# Patient Record
Sex: Male | Born: 1960 | Race: White | Hispanic: No | Marital: Single | Smoking: Never smoker
Health system: Southern US, Community
[De-identification: ages and names within clinical notes are randomized; demographics above are authoritative.]

## PROBLEM LIST (undated history)

## (undated) DIAGNOSIS — Z8481 Family history of carrier of genetic disease: Secondary | ICD-10-CM

## (undated) DIAGNOSIS — Z1501 Genetic susceptibility to malignant neoplasm of breast: Secondary | ICD-10-CM

## (undated) DIAGNOSIS — E785 Hyperlipidemia, unspecified: Secondary | ICD-10-CM

## (undated) DIAGNOSIS — I10 Essential (primary) hypertension: Secondary | ICD-10-CM

## (undated) DIAGNOSIS — Z8042 Family history of malignant neoplasm of prostate: Secondary | ICD-10-CM

## (undated) DIAGNOSIS — K760 Fatty (change of) liver, not elsewhere classified: Secondary | ICD-10-CM

## (undated) DIAGNOSIS — Z8041 Family history of malignant neoplasm of ovary: Secondary | ICD-10-CM

## (undated) DIAGNOSIS — Z1509 Genetic susceptibility to other malignant neoplasm: Principal | ICD-10-CM

## (undated) HISTORY — DX: Essential (primary) hypertension: I10

## (undated) HISTORY — DX: Family history of malignant neoplasm of prostate: Z80.42

## (undated) HISTORY — DX: Family history of carrier of genetic disease: Z84.81

## (undated) HISTORY — DX: Family history of malignant neoplasm of ovary: Z80.41

## (undated) HISTORY — DX: Hyperlipidemia, unspecified: E78.5

## (undated) HISTORY — DX: Fatty (change of) liver, not elsewhere classified: K76.0

## (undated) HISTORY — PX: NO PAST SURGERIES: SHX2092

## (undated) HISTORY — DX: Genetic susceptibility to other malignant neoplasm: Z15.09

## (undated) HISTORY — DX: Genetic susceptibility to malignant neoplasm of breast: Z15.09

## (undated) HISTORY — DX: Genetic susceptibility to malignant neoplasm of breast: Z15.01

---

## 1998-06-08 ENCOUNTER — Other Ambulatory Visit: Admission: RE | Admit: 1998-06-08 | Discharge: 1998-06-08 | Payer: Self-pay | Admitting: *Deleted

## 2006-09-13 ENCOUNTER — Ambulatory Visit: Payer: Self-pay | Admitting: Family Medicine

## 2006-10-03 ENCOUNTER — Ambulatory Visit: Payer: Self-pay | Admitting: Family Medicine

## 2006-11-08 ENCOUNTER — Ambulatory Visit: Payer: Self-pay | Admitting: Family Medicine

## 2006-11-08 LAB — CONVERTED CEMR LAB
BUN: 9 mg/dL (ref 6–23)
CO2: 27 meq/L (ref 19–32)
Chloride: 105 meq/L (ref 96–112)
Glomerular Filtration Rate, Af Am: 118 mL/min/{1.73_m2}
Glucose, Bld: 126 mg/dL — ABNORMAL HIGH (ref 70–99)

## 2006-11-21 ENCOUNTER — Ambulatory Visit: Payer: Self-pay | Admitting: Gastroenterology

## 2006-12-10 DIAGNOSIS — K760 Fatty (change of) liver, not elsewhere classified: Secondary | ICD-10-CM

## 2006-12-10 HISTORY — DX: Fatty (change of) liver, not elsewhere classified: K76.0

## 2006-12-16 ENCOUNTER — Ambulatory Visit: Payer: Self-pay | Admitting: Gastroenterology

## 2006-12-16 LAB — CONVERTED CEMR LAB
Ceruloplasmin: 26 mg/dL (ref 21–63)
HCV Ab: NEGATIVE

## 2007-01-04 DIAGNOSIS — I10 Essential (primary) hypertension: Secondary | ICD-10-CM | POA: Insufficient documentation

## 2007-01-04 DIAGNOSIS — E785 Hyperlipidemia, unspecified: Secondary | ICD-10-CM

## 2007-01-17 ENCOUNTER — Ambulatory Visit: Payer: Self-pay | Admitting: Gastroenterology

## 2007-02-10 ENCOUNTER — Ambulatory Visit: Payer: Self-pay | Admitting: Family Medicine

## 2007-02-10 LAB — CONVERTED CEMR LAB
BUN: 8 mg/dL (ref 6–23)
GFR calc Af Amer: 104 mL/min
Glucose, Bld: 135 mg/dL — ABNORMAL HIGH (ref 70–99)
HDL: 36.1 mg/dL — ABNORMAL LOW (ref >39.0)
Sodium: 141 meq/L (ref 135–145)
Total CHOL/HDL Ratio: 4.9
Triglycerides: 205 mg/dL (ref 0–149)
VLDL: 41 mg/dL — ABNORMAL HIGH (ref 0–40)

## 2007-03-07 ENCOUNTER — Ambulatory Visit: Payer: Self-pay | Admitting: Family Medicine

## 2007-03-07 LAB — CONVERTED CEMR LAB: Hgb A1c MFr Bld: 6.6 % — ABNORMAL HIGH (ref 4.6–6.0)

## 2007-03-19 ENCOUNTER — Ambulatory Visit: Payer: Self-pay | Admitting: Family Medicine

## 2007-04-11 ENCOUNTER — Encounter: Admission: RE | Admit: 2007-04-11 | Discharge: 2007-07-10 | Payer: Self-pay | Admitting: Family Medicine

## 2007-06-04 ENCOUNTER — Ambulatory Visit: Payer: Self-pay | Admitting: Family Medicine

## 2007-06-06 ENCOUNTER — Encounter (INDEPENDENT_AMBULATORY_CARE_PROVIDER_SITE_OTHER): Payer: Self-pay | Admitting: *Deleted

## 2007-06-06 LAB — CONVERTED CEMR LAB
Direct LDL: 142.4 mg/dL
Hgb A1c MFr Bld: 6.1 % — ABNORMAL HIGH (ref 4.6–6.0)
Total CHOL/HDL Ratio: 6.2

## 2007-06-12 ENCOUNTER — Ambulatory Visit: Payer: Self-pay | Admitting: Family Medicine

## 2007-06-12 DIAGNOSIS — E119 Type 2 diabetes mellitus without complications: Secondary | ICD-10-CM | POA: Insufficient documentation

## 2007-06-19 ENCOUNTER — Ambulatory Visit: Payer: Self-pay | Admitting: Cardiovascular Disease

## 2007-07-28 ENCOUNTER — Encounter (INDEPENDENT_AMBULATORY_CARE_PROVIDER_SITE_OTHER): Payer: Self-pay | Admitting: *Deleted

## 2007-07-28 ENCOUNTER — Ambulatory Visit: Payer: Self-pay | Admitting: Family Medicine

## 2007-07-28 LAB — CONVERTED CEMR LAB
ALT: 35 units/L (ref 0–53)
BUN: 11 mg/dL (ref 6–23)
CO2: 29 meq/L (ref 19–32)
Cholesterol: 125 mg/dL (ref 0–200)
Creatinine, Ser: 0.7 mg/dL (ref 0.4–1.5)
HDL: 35.5 mg/dL — ABNORMAL LOW (ref >39.0)
Sodium: 146 meq/L — ABNORMAL HIGH (ref 135–145)
Total CHOL/HDL Ratio: 3.5
Triglycerides: 108 mg/dL (ref 0–149)
VLDL: 22 mg/dL (ref 0–40)

## 2007-07-31 ENCOUNTER — Ambulatory Visit: Payer: Self-pay | Admitting: Cardiology

## 2007-09-24 ENCOUNTER — Ambulatory Visit: Payer: Self-pay | Admitting: Family Medicine

## 2007-09-24 LAB — CONVERTED CEMR LAB
LDL Cholesterol: 51 mg/dL (ref 0–99)
Total CHOL/HDL Ratio: 3.5
Triglycerides: 166 mg/dL — ABNORMAL HIGH (ref 0–149)
VLDL: 33 mg/dL (ref 0–40)

## 2007-10-01 ENCOUNTER — Ambulatory Visit: Payer: Self-pay | Admitting: Family Medicine

## 2007-10-16 ENCOUNTER — Ambulatory Visit: Payer: Self-pay | Admitting: Family Medicine

## 2007-10-17 LAB — CONVERTED CEMR LAB
ALT: 36 units/L (ref 0–53)
Alkaline Phosphatase: 71 units/L (ref 39–117)
Bilirubin, Direct: 0.1 mg/dL (ref 0.0–0.3)
Cholesterol: 123 mg/dL (ref 0–200)
HDL: 31.4 mg/dL — ABNORMAL LOW (ref >39.0)
LDL Cholesterol: 60 mg/dL (ref 0–99)
VLDL: 32 mg/dL (ref 0–40)

## 2007-10-23 ENCOUNTER — Ambulatory Visit: Payer: Self-pay | Admitting: Cardiology

## 2008-01-07 ENCOUNTER — Ambulatory Visit: Payer: Self-pay | Admitting: Family Medicine

## 2008-01-09 ENCOUNTER — Encounter (INDEPENDENT_AMBULATORY_CARE_PROVIDER_SITE_OTHER): Payer: Self-pay | Admitting: *Deleted

## 2008-01-09 LAB — CONVERTED CEMR LAB
CO2: 25 meq/L (ref 19–32)
Creatinine, Ser: 0.9 mg/dL (ref 0.4–1.5)
GFR calc non Af Amer: 97 mL/min
Hgb A1c MFr Bld: 6.1 % — ABNORMAL HIGH (ref 4.6–6.0)
Microalb Creat Ratio: 11.5 mg/g (ref 0.0–30.0)
Microalb, Ur: 1.7 mg/dL (ref 0.0–1.9)
Sodium: 139 meq/L (ref 135–145)

## 2008-01-22 ENCOUNTER — Ambulatory Visit: Payer: Self-pay | Admitting: Family Medicine

## 2008-02-11 ENCOUNTER — Telehealth (INDEPENDENT_AMBULATORY_CARE_PROVIDER_SITE_OTHER): Payer: Self-pay | Admitting: *Deleted

## 2008-03-18 ENCOUNTER — Ambulatory Visit: Payer: Self-pay | Admitting: Internal Medicine

## 2008-03-22 ENCOUNTER — Encounter (INDEPENDENT_AMBULATORY_CARE_PROVIDER_SITE_OTHER): Payer: Self-pay | Admitting: *Deleted

## 2008-03-22 LAB — CONVERTED CEMR LAB
AST: 25 units/L (ref 0–37)
Albumin: 3.9 g/dL (ref 3.5–5.2)
Bilirubin, Direct: 0.1 mg/dL (ref 0.0–0.3)
Cholesterol: 112 mg/dL (ref 0–200)
LDL Cholesterol: 54 mg/dL (ref 0–99)
Total Bilirubin: 0.8 mg/dL (ref 0.3–1.2)
Total CHOL/HDL Ratio: 3.9

## 2008-03-25 ENCOUNTER — Ambulatory Visit: Payer: Self-pay | Admitting: Cardiology

## 2008-04-07 ENCOUNTER — Ambulatory Visit: Payer: Self-pay | Admitting: Internal Medicine

## 2008-04-09 LAB — CONVERTED CEMR LAB: Hgb A1c MFr Bld: 6.2 % — ABNORMAL HIGH (ref 4.6–6.0)

## 2008-06-10 ENCOUNTER — Ambulatory Visit: Payer: Self-pay | Admitting: Internal Medicine

## 2008-06-14 ENCOUNTER — Encounter (INDEPENDENT_AMBULATORY_CARE_PROVIDER_SITE_OTHER): Payer: Self-pay | Admitting: *Deleted

## 2008-06-14 LAB — CONVERTED CEMR LAB
Cholesterol: 124 mg/dL (ref 0–200)
LDL Cholesterol: 57 mg/dL (ref 0–99)
Triglycerides: 176 mg/dL — ABNORMAL HIGH (ref 0–149)
VLDL: 35 mg/dL (ref 0–40)

## 2008-06-17 ENCOUNTER — Ambulatory Visit: Payer: Self-pay | Admitting: Cardiology

## 2008-06-17 ENCOUNTER — Telehealth: Payer: Self-pay | Admitting: Internal Medicine

## 2008-08-04 ENCOUNTER — Ambulatory Visit: Payer: Self-pay | Admitting: Internal Medicine

## 2008-08-09 LAB — CONVERTED CEMR LAB
Basophils Absolute: 0.1 10*3/uL (ref 0.0–0.1)
Basophils Relative: 0.8 % (ref 0.0–3.0)
CO2: 26 meq/L (ref 19–32)
Calcium: 9 mg/dL (ref 8.4–10.5)
Creatinine, Ser: 0.9 mg/dL (ref 0.4–1.5)
Eosinophils Absolute: 0.3 10*3/uL (ref 0.0–0.7)
GFR calc Af Amer: 117 mL/min
GFR calc non Af Amer: 97 mL/min
Hemoglobin: 15.9 g/dL (ref 13.0–17.0)
Hgb A1c MFr Bld: 6.4 % — ABNORMAL HIGH (ref 4.6–6.0)
Lymphocytes Relative: 25 % (ref 12.0–46.0)
MCHC: 35.5 g/dL (ref 30.0–36.0)
MCV: 93.3 fL (ref 78.0–100.0)
Neutro Abs: 5.6 10*3/uL (ref 1.4–7.7)
RDW: 12.2 % (ref 11.5–14.6)
Sodium: 141 meq/L (ref 135–145)

## 2008-09-14 ENCOUNTER — Telehealth (INDEPENDENT_AMBULATORY_CARE_PROVIDER_SITE_OTHER): Payer: Self-pay | Admitting: *Deleted

## 2008-09-28 ENCOUNTER — Telehealth (INDEPENDENT_AMBULATORY_CARE_PROVIDER_SITE_OTHER): Payer: Self-pay | Admitting: *Deleted

## 2008-11-11 ENCOUNTER — Ambulatory Visit: Payer: Self-pay | Admitting: Internal Medicine

## 2008-11-18 ENCOUNTER — Ambulatory Visit: Payer: Self-pay | Admitting: Cardiology

## 2008-11-23 ENCOUNTER — Telehealth (INDEPENDENT_AMBULATORY_CARE_PROVIDER_SITE_OTHER): Payer: Self-pay | Admitting: *Deleted

## 2008-11-23 LAB — CONVERTED CEMR LAB: Cholesterol: 127 mg/dL (ref 0–200)

## 2009-01-03 ENCOUNTER — Ambulatory Visit: Payer: Self-pay | Admitting: Internal Medicine

## 2009-01-06 LAB — CONVERTED CEMR LAB
AST: 31 units/L (ref 0–37)
Albumin: 4.2 g/dL (ref 3.5–5.2)
Alkaline Phosphatase: 52 units/L (ref 39–117)
BUN: 11 mg/dL (ref 6–23)
Bilirubin, Direct: 0.1 mg/dL (ref 0.0–0.3)
CO2: 26 meq/L (ref 19–32)
Chloride: 109 meq/L (ref 96–112)
Glucose, Bld: 137 mg/dL — ABNORMAL HIGH (ref 70–99)
Microalb Creat Ratio: 4.1 mg/g (ref 0.0–30.0)
Potassium: 4.1 meq/L (ref 3.5–5.1)
Sodium: 143 meq/L (ref 135–145)
Total Protein: 6.8 g/dL (ref 6.0–8.3)

## 2009-03-03 ENCOUNTER — Ambulatory Visit: Payer: Self-pay | Admitting: Internal Medicine

## 2009-03-08 LAB — CONVERTED CEMR LAB
Cholesterol: 126 mg/dL (ref 0–200)
HDL: 30.5 mg/dL — ABNORMAL LOW (ref >39.00)
LDL Cholesterol: 61 mg/dL (ref 0–99)
Total CHOL/HDL Ratio: 4
Triglycerides: 171 mg/dL — ABNORMAL HIGH (ref 0.0–149.0)

## 2009-03-10 ENCOUNTER — Ambulatory Visit: Payer: Self-pay | Admitting: Internal Medicine

## 2009-03-10 ENCOUNTER — Encounter: Payer: Self-pay | Admitting: Cardiology

## 2009-03-10 LAB — CONVERTED CEMR LAB: HDL goal, serum: 40 mg/dL

## 2009-03-16 ENCOUNTER — Encounter: Payer: Self-pay | Admitting: Internal Medicine

## 2009-05-03 ENCOUNTER — Ambulatory Visit: Payer: Self-pay | Admitting: Internal Medicine

## 2009-05-12 LAB — CONVERTED CEMR LAB: Hgb A1c MFr Bld: 6.3 % (ref 4.6–6.5)

## 2009-08-22 ENCOUNTER — Ambulatory Visit: Payer: Self-pay | Admitting: Internal Medicine

## 2009-08-22 LAB — CONVERTED CEMR LAB
ALT: 37 units/L (ref 0–53)
AST: 31 units/L (ref 0–37)
Albumin: 3.8 g/dL (ref 3.5–5.2)
HDL: 33.4 mg/dL — ABNORMAL LOW (ref >39.00)
Total Bilirubin: 0.7 mg/dL (ref 0.3–1.2)
Total CHOL/HDL Ratio: 4
Triglycerides: 122 mg/dL (ref 0.0–149.0)
VLDL: 24.4 mg/dL (ref 0.0–40.0)

## 2009-08-25 ENCOUNTER — Ambulatory Visit: Payer: Self-pay | Admitting: Cardiology

## 2009-09-02 ENCOUNTER — Telehealth: Payer: Self-pay | Admitting: Cardiology

## 2009-10-28 ENCOUNTER — Encounter: Payer: Self-pay | Admitting: Internal Medicine

## 2010-01-13 ENCOUNTER — Ambulatory Visit: Payer: Self-pay | Admitting: Internal Medicine

## 2010-01-13 DIAGNOSIS — L989 Disorder of the skin and subcutaneous tissue, unspecified: Secondary | ICD-10-CM | POA: Insufficient documentation

## 2010-01-13 LAB — CONVERTED CEMR LAB
Creatinine,U: 133.5 mg/dL
Microalb, Ur: 0.5 mg/dL (ref 0.0–1.9)

## 2010-01-16 LAB — CONVERTED CEMR LAB
ALT: 42 units/L (ref 0–53)
Basophils Relative: 0.9 % (ref 0.0–3.0)
Chloride: 107 meq/L (ref 96–112)
Eosinophils Relative: 3.2 % (ref 0.0–5.0)
GFR calc non Af Amer: 109.89 mL/min (ref >60)
HCT: 48.7 % (ref 39.0–52.0)
Hemoglobin: 16 g/dL (ref 13.0–17.0)
Hgb A1c MFr Bld: 6.5 % (ref 4.6–6.5)
Lymphs Abs: 2.1 10*3/uL (ref 0.7–4.0)
MCV: 97.3 fL (ref 78.0–100.0)
Monocytes Relative: 4.8 % (ref 3.0–12.0)
Neutro Abs: 5.5 10*3/uL (ref 1.4–7.7)
Potassium: 3.9 meq/L (ref 3.5–5.1)
RBC: 5 M/uL (ref 4.22–5.81)
Sodium: 142 meq/L (ref 135–145)
Total Protein: 7.7 g/dL (ref 6.0–8.3)
WBC: 8.4 10*3/uL (ref 4.5–10.5)

## 2010-02-20 ENCOUNTER — Ambulatory Visit: Payer: Self-pay | Admitting: Internal Medicine

## 2010-02-20 LAB — CONVERTED CEMR LAB
ALT: 47 units/L (ref 0–53)
AST: 41 units/L — ABNORMAL HIGH (ref 0–37)
Cholesterol: 123 mg/dL (ref 0–200)
HDL: 39.3 mg/dL (ref >39.00)
Total Bilirubin: 0.6 mg/dL (ref 0.3–1.2)
Total Protein: 6.6 g/dL (ref 6.0–8.3)
VLDL: 37.4 mg/dL (ref 0.0–40.0)

## 2010-02-23 ENCOUNTER — Ambulatory Visit: Payer: Self-pay | Admitting: Cardiology

## 2010-05-15 ENCOUNTER — Ambulatory Visit: Payer: Self-pay | Admitting: Internal Medicine

## 2010-10-02 ENCOUNTER — Ambulatory Visit: Payer: Self-pay | Admitting: Internal Medicine

## 2010-10-06 ENCOUNTER — Ambulatory Visit: Payer: Self-pay | Admitting: Internal Medicine

## 2010-10-12 ENCOUNTER — Ambulatory Visit: Payer: Self-pay | Admitting: Cardiology

## 2010-10-12 LAB — CONVERTED CEMR LAB
AST: 29 units/L (ref 0–37)
Alkaline Phosphatase: 70 units/L (ref 39–117)
Bilirubin, Direct: 0.1 mg/dL (ref 0.0–0.3)
Cholesterol: 116 mg/dL (ref 0–200)
LDL Cholesterol: 56 mg/dL (ref 0–99)
Total CHOL/HDL Ratio: 3
Total Protein: 6.6 g/dL (ref 6.0–8.3)

## 2010-11-23 ENCOUNTER — Ambulatory Visit: Payer: Self-pay | Admitting: Internal Medicine

## 2010-11-28 LAB — CONVERTED CEMR LAB
ALT: 25 units/L (ref 0–53)
AST: 22 units/L (ref 0–37)
BUN: 11 mg/dL (ref 6–23)
Chloride: 106 meq/L (ref 96–112)
Hgb A1c MFr Bld: 6.4 % (ref 4.6–6.5)
Potassium: 4.8 meq/L (ref 3.5–5.1)
Sodium: 143 meq/L (ref 135–145)

## 2010-12-12 ENCOUNTER — Telehealth (INDEPENDENT_AMBULATORY_CARE_PROVIDER_SITE_OTHER): Payer: Self-pay | Admitting: *Deleted

## 2011-01-11 NOTE — Assessment & Plan Note (Signed)
Summary: 4 month roa//lh   Vital Signs:  Patient profile:   50 year old male Height:      66.6 inches Weight:      212 pounds Temp:     98.9 degrees F oral Pulse rate:   72 / minute BP sitting:   140 / 100  (left arm)  Vitals Entered By: Jeremy Johann CMA (May 15, 2010 9:18 AM) CC: 4 MONTH F/U Comments --BP CHECK --FASTING REVIEWED MED LIST, PATIENT AGREED DOSE AND INSTRUCTION CORRECT     History of Present Illness: Hyperlipidemia-- note from the lipid clinic reviewed  Hypertension-- ambulatory BPs in the 120/75 Diabetes -- ambulatory CBGs from 90 to 100  Allergies (verified): No Known Drug Allergies  Past History:  Past Medical History: Hyperlipidemia Hypertension Diabetes mellitus, type II, Dx 2008 aprox  Past Surgical History: Reviewed history from 01/07/2008 and no changes required. 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 CV:  Denies chest pain or discomfort and swelling of feet. Resp:  Denies cough and shortness of breath. Endo:  good medication compliance w/ chol meds .  Physical Exam  General:  alert and well-developed.   Lungs:  normal respiratory effort, no intercostal retractions, no accessory muscle use, and normal breath sounds.   Heart:  normal rate, regular rhythm, and no murmur.   Abdomen:  soft and non-tender.   Extremities:  no pretibial edema bilaterally    Impression & Recommendations:  Problem # 1:  DIABETES MELLITUS, TYPE II (ICD-250.00) well controlled, on diet only. His updated medication list for this problem includes:    [employer]ons Ec Aspirin 325 Mg Tbec (Aspirin) .Marland Kitchen... Take one tablet.  Labs Reviewed: Creat: 0.8 (01/13/2010)     Last Eye Exam: normal (06/09/2008) Reviewed HgBA1c results: 6.5 (01/13/2010)  6.3  (05/03/2009)  Problem # 2:  HYPERTENSION (ICD-401.9) ambulatory BPs are normal, he takes his BP with a wrist cuff. 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 1 by mouth qd  BP today: 140/100 Prior BP: 132/90 (02/23/2010)  Prior 10 Yr Risk Heart Disease: 14 % (08/25/2009)  Labs Reviewed: K+: 3.9 (01/13/2010) Creat: : 0.8 (01/13/2010)   Chol: 123 (02/20/2010)   HDL: 39.30 (02/20/2010)   LDL: 46 (02/20/2010)   TG: 187.0 (02/20/2010)  Problem # 3:  HYPERLIPIDEMIA (ICD-272.4) per the lipid clinic His updated medication list for this problem includes:    Lipitor 10 Mg Tabs (Atorvastatin calcium) .Marland Kitchen... 1 a day    Niaspan 1000 Mg Tbcr (Niacin (antihyperlipidemic)) .Marland Kitchen... Take 2 tablet daily  Labs Reviewed: SGOT: 41 (02/20/2010)   SGPT: 47 (02/20/2010)  Lipid Goals: Chol Goal: 200 (03/10/2009)   HDL Goal: 40 (03/10/2009)   LDL Goal: 100 (03/10/2009)   TG Goal: 150 (03/10/2009)  Prior 10 Yr Risk Heart Disease: 14 % (08/25/2009)   HDL:39.30 (02/20/2010), 33.40 (08/22/2009)  LDL:46 (02/20/2010), 60 (08/22/2009)  Chol:123 (02/20/2010), 118 (08/22/2009)  Trig:187.0 (02/20/2010), 122.0 (08/22/2009)  Complete Medication List: 1)  Lipitor 10 Mg Tabs (Atorvastatin calcium) .Marland Kitchen.. 1 a day 2)  Omega-3 1000 Mg Caps (Omega-3 fatty acids) .... Take 2  caps bid 3)  Niaspan 1000 Mg Tbcr (Niacin (antihyperlipidemic)) .... Take 2 tablet daily 4)  Atenolol 100 Mg Tabs (Atenolol) .Marland Kitchen.. 1 by mouth qd 5)  [employer] Ec Aspirin 325 Mg Tbec (Aspirin) .... Take one tablet. 6)  Centrum Chew (Multiple vitamins-minerals) .... Take one daily. 7)  One Touch Lancets Misc (Lancets) .... Use as directed twice daily 8)  One Touch Ultra2 Meter  .... Use as directed 9)  One Touch Ultra2 Testing Strips  .... Use  one strip in glucometer twice daily as directed.  Patient Instructions: 1)  Please schedule a follow-up appointment in 4 to 5 months .  Prescriptions: LIPITOR 10 MG TABS (ATORVASTATIN CALCIUM) 1 a day  #30 x 12   Entered and Authorized by:   Tiffney Haughton E. Darean Rote MD   Signed by:   Nolon Rod. Garald Rhew MD on 05/15/2010   Method used:   Print then Give to Patient   RxID:   [id number] NIASPAN 1000 MG TBCR (NIACIN (ANTIHYPERLIPIDEMIC)) Take 2 tablet daily  #180 x 3   Entered and Authorized by:   Elita Quick E. Odyn Turko MD   Signed by:   Nolon Rod. Cheril Slattery MD on 05/15/2010   Method used:   Print then Give to Patient   RxID:   [id number]

## 2011-01-11 NOTE — Assessment & Plan Note (Signed)
Summary: rov/sp   Referring Provider:  Leanne Chang  CC:  dyslipidemia follow-up.  History of Present Illness:  Lipid Clinic Visit      The patient comes in today for dyslipidemia follow-up.  The patient has no complaints of chest pain, shortness of breath, muscle aches, and muscle cramps.  Review of exercise habits reveals that the patient is walking 3 days a week.  Risk factor modification shows that the patient is doing  some exercise and is compliant with medication is good.  He is trying to limit fats and eating more vegetables.   Lipid Management Provider  Weston Brass, PharmD  Preventive Screening-Counseling & Management  Alcohol-Tobacco     Alcohol drinks/day: 0     Smoking Status: never  Caffeine-Diet-Exercise     Caffeine use/day: diet soda each meal  Current Medications (verified): 1)  Lipitor 10 Mg Tabs (Atorvastatin Calcium) .Marland Kitchen.. 1 A Day 2)  Omega-3 1000 Mg Caps (Omega-3 Fatty Acids) .... Take 2 Caps Bid 3)  Niaspan 1000 Mg Tbcr (Niacin (Antihyperlipidemic)) .... Take 2 Tablet Daily 4)  Atenolol 100 Mg Tabs (Atenolol) .Marland Kitchen.. 1 By Mouth Qd 5)  [employer] Ec Aspirin 325 Mg  Tbec (Aspirin) .... Take One Tablet. 6)  Centrum   Chew (Multiple Vitamins-Minerals) .... Take One Daily. 7)  One Touch Lancets   Misc (Lancets) .... Use As Directed Twice Daily 8)  One Touch Ultra2 Meter .... Use As Directed 9)  One Touch Ultra2 Testing Strips .... Use One Strip in Glucometer Twice Daily As Directed.  Allergies (verified): No Known Drug Allergies  Social History: Caffeine use/day:  diet soda each meal   Vital Signs:  Patient profile:   50 year old male Height:      66.5 inches Weight:      216 pounds Pulse rate:   80 / minute Pulse rhythm:   regular BP sitting:   132 / 90  (right arm) Cuff size:   large  Impression & Recommendations:  Problem # 1:  HYPERLIPIDEMIA (ICD-272.4)  His updated medication list for this problem includes:    Lipitor 10 Mg Tabs  (Atorvastatin calcium) .Marland Kitchen... 1 a day    Niaspan 1000 Mg Tbcr (Niacin (antihyperlipidemic)) .Marland Kitchen... Take 2 tablet daily    Fish Oil 1000mg  2 caps two times a day  Mr Leser returns to lipid clinic with no complaints.  No CP, SOB, muscle pains.  He states compliance with meds.  He has DM which is diet controlled.  Recent HgA1c = 6.5.  His BP is  increased today as well as at his last MD visit.  he is to  f/u with primary MD in June for recheck.  He is walking  3 x week at a time which  takes .  I have encouraged him to increase pace to take 76min/mi by end of May.  He is diet non-compliant.Marland KitchenMarland KitchenHe eats cheerios for breakfast, 1xweek he eats a grilled chicken salad for lunch, the other days he eats fried chicken sandwich and fries from [employer] and some type of beans for dinner.  i have asked him to decrease fried chicken and increase grilled chicken sandwich and salad,  as well as count out 5-6 large [employer] french fries and throw the rest away.  I alos encouraged him to increase fruits and vegies in diet and decrease diet sodas.  I think he is hesitant to change diet exercise since his lipid panel is at goal.  TC 123 at goal <  200  TG 187 > goal < 150  HDL 39 close to goal > 40 much improved from last visit  LDL 46 at goal < 70 labs of f/u 72mo

## 2011-01-11 NOTE — Progress Notes (Signed)
  Phone Note Call from Patient   Summary of Call: Pt called and wants to switch back to [facility]- will send Lipitor and Metformin to [facility]. Army Fossa CMA  December 12, 2010 9:37 AM     New/Updated Medications: LIPITOR 10 MG TABS (ATORVASTATIN CALCIUM) 1 by mouth at bedtime. METFORMIN HCL 500 MG TABS (METFORMIN HCL) 1 po  two times a day. Prescriptions: LIPITOR 10 MG TABS (ATORVASTATIN CALCIUM) 1 by mouth at bedtime.  #90 x 1   Entered by:   Army Fossa CMA   Authorized by:   Nolon Rod. Paz MD   Signed by:   Army Fossa CMA on 12/12/2010   Method used:   Faxed to ...       [facility] (mail-order)             , [place]         Ph: [phone]       Fax: [phone]   RxID:   [id number] METFORMIN HCL 500 MG TABS (METFORMIN HCL) 1 po  two times a day.  #180 x 1   Entered by:   Army Fossa CMA   Authorized by:   Nolon Rod. Paz MD   Signed by:   Army Fossa CMA on 12/12/2010   Method used:   Faxed to ...       [facility] (mail-order)             , [place]         Ph: [phone]       Fax: [phone]   RxID:   [id number]

## 2011-01-11 NOTE — Assessment & Plan Note (Signed)
Summary: lipid - [facility]      Allergies Added: NKDA      [name] returns to lipid clinic with no complaints.  No CP, SOB, or muscle pains.  He states compliance with current cholesterol medications which include Lipitor 10mg  and fish oil 4 gm.   His DM remains controlled with most recent A1c of 7%.  He was recently started on metformin.      Pt is trying to make changes in his diet but still has room for improvement. He is eating Cheerios for breakfast.  Lunch is often still fast food but he has substituted grilled chicken instead of fries and continues to cut down on french fries.  Dinner is often a ham sandwich.  He tries to make a cooked meal about 2 times a week but these include starchy vegetables such as corn and potatoes.  He does drink approx. 3 diet Pepsi a day.       He admits to not walking in the recent past.  He started walking in the mall today.  His plan is to walk 2 miles within 30 minutes 5 days a week.   Lipid Management [name]  [name], PharmD  Current Medications (verified): 1)  Lipitor 10 Mg Tabs (Atorvastatin Calcium) .Marland Kitchen.. 1 A Day 2)  Omega-3 1000 Mg Caps (Omega-3 Fatty Acids) .... Take 2 Caps Bid 3)  Niaspan 1000 Mg Tbcr (Niacin (Antihyperlipidemic)) .... Take 2 Tablet Daily 4)  Atenolol 100 Mg Tabs (Atenolol) .Marland Kitchen.. 1 By Mouth Qd 5)  [name] 325 Mg  Tbec (Benitez) .... Take One Tablet. 6)  Centrum   Chew (Multiple Vitamins-Minerals) .... Take One Daily. 7)  One Touch Lancets   Misc (Lancets) .... Use As Directed Twice Daily 8)  One Touch Ultra2 Meter .... Use As Directed 9)  One Touch Ultra2 Testing Strips .... Use One Strip in Glucometer Twice Daily As Directed. 10)  Metformin Hcl 500 Mg Tabs (Metformin Hcl) .... 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  His TG have improved from 187 to 130.  LDL increased slightly from 46 to 56 but still below goal of <70.  LFTs are WNL.  Will continue current medications.  Encouraged patient to continue trying to decrease fried, greasy fast foods.  Gave information on nutritional value of fast food meals so he can make better choices.  Asked him to decrease ham sandwiches and increase the home-cooked meals for dinner and substitute water for at least 1 soft drink/day.  Encouraged him to meet his goal of walking at least 5 days/week. Given pt has been well controlled and tolerated medications, will allow pt to f/u lipids with Dr. Drue Novel from now on.  If any problems arise, pt can f/u in lipid clinic again.    His updated medication  list for this problem includes:    Lipitor 10 Mg Tabs (Atorvastatin calcium) .Marland Kitchen... 1 a day    Niaspan 1000 Mg Tbcr (Niacin (antihyperlipidemic)) .Marland Kitchen... Take 2 tablet daily  Patient Instructions: 1)  Your cholesterol looks great 2)  Continue current medications 3)  Continue to cut down on french fries and fried foods.  Try to increase vegetables as much as possible 4)  Continue to exercise at the mall.  Try to increase your pace so your heartrate is slightly elevated 5)  Follow-up with Dr. Drue Novel to recheck cholesterol in 1 year.

## 2011-01-11 NOTE — Assessment & Plan Note (Signed)
Summary: 4-5 month roa//lch   Vital Signs:  Patient profile:   50 year old male Weight:      212.50 pounds Pulse rate:   76 / minute Pulse rhythm:   regular BP sitting:   130 / 88  (left arm) Cuff size:   large  Vitals Entered By: Army Fossa CMA (October 06, 2010 8:57 AM) CC: 5 month f/u visit- fasting  Comments [facility] [address]    History of Present Illness: ROV feeling well Recent labs from the lipid clinic reviewed with the patient. Cholesterol well controlled  Current Medications (verified): 1)  Lipitor 10 Mg Tabs (Atorvastatin Calcium) .Marland Kitchen.. 1 A Day 2)  Omega-3 1000 Mg Caps (Omega-3 Fatty Acids) .... Take 2 Caps Bid 3)  Niaspan 1000 Mg Tbcr (Niacin (Antihyperlipidemic)) .... Take 2 Tablet Daily 4)  Atenolol 100 Mg Tabs (Atenolol) .Marland Kitchen.. 1 By Mouth Qd 5)  [employer] Ec Aspirin 325 Mg  Tbec (Aspirin) .... Take One Tablet. 6)  Centrum   Chew (Multiple Vitamins-Minerals) .... Take One Daily. 7)  One Touch Lancets   Misc (Lancets) .... Use As Directed Twice Daily 8)  One Touch Ultra2 Meter .... Use As Directed 9)  One Touch Ultra2 Testing Strips .... Use One Strip in Glucometer Twice Daily As Directed.  Allergies (verified): No Known Drug Allergies  Past History:  Past Medical History: Reviewed history from 05/15/2010 and no changes required. Hyperlipidemia Hypertension Diabetes mellitus, type II, Dx 2008 aprox  Past Surgical History: Reviewed history from 01/07/2008 and no changes required. Denies surgical history  Family History: Reviewed history from 01/03/2009 and no changes required. MI--no DM--F Prostate ca--no colon ca--no  Review of Systems       diet improved a bit, cutting back on fast food  exercise-- regularly walks in the treadmil x 3 week  ambulatory CBGs varies from  80 to 102, fasting CV:  Denies chest pain or discomfort and swelling of feet. Resp:  Denies cough and shortness of breath.  Physical Exam  General:  alert and  well-developed.   Lungs:  normal respiratory effort, no intercostal retractions, no accessory muscle use, and normal breath sounds.   Heart:  normal rate, regular rhythm, and no murmur.   Extremities:  no pretibial edema bilaterally    Impression & Recommendations:  Problem # 1:  HYPERLIPIDEMIA (ICD-272.4) at goal  His updated medication list for this problem includes:    Lipitor 10 Mg Tabs (Atorvastatin calcium) .Marland Kitchen... 1 a day    Niaspan 1000 Mg Tbcr (Niacin (antihyperlipidemic)) .Marland Kitchen... Take 2 tablet daily  Labs Reviewed: SGOT: 29 (10/02/2010)   SGPT: 39 (10/02/2010)  Lipid Goals: Chol Goal: 200 (03/10/2009)   HDL Goal: 40 (03/10/2009)   LDL Goal: 100 (03/10/2009)   TG Goal: 150 (03/10/2009)  Prior 10 Yr Risk Heart Disease: 14 % (08/25/2009)   HDL:34.30 (10/02/2010), 39.30 (02/20/2010)  LDL:56 (10/02/2010), 46 (02/20/2010)  Chol:116 (10/02/2010), 123 (02/20/2010)  Trig:130.0 (10/02/2010), 187.0 (02/20/2010)  Problem # 2:  DIABETES MELLITUS, TYPE II (ICD-250.00) all his A1c is reviewed, last A1c slightly higher than before. Counseling about diet particularly during  the holidays Increase exercise from 3 to 4 times a week? Asked patient to set  some goals, for instance not to gain weight in the next 3 months   His updated medication list for this problem includes:    [employer] Ec Aspirin 325 Mg Tbec (Aspirin) .Marland Kitchen... Take one tablet.  Labs Reviewed: Creat: 0.8 (01/13/2010)     Last Eye  Exam: normal (06/09/2008) Reviewed HgBA1c results: 6.5 (01/13/2010)  6.3 (05/03/2009)  Orders: Venipuncture (16109) TLB-A1C / Hgb A1C (Glycohemoglobin) (83036-A1C)  Complete Medication List: 1)  Lipitor 10 Mg Tabs (Atorvastatin calcium) .Marland Kitchen.. 1 a day 2)  Omega-3 1000 Mg Caps (Omega-3 fatty acids) .... Take 2 caps bid 3)  Niaspan 1000 Mg Tbcr (Niacin (antihyperlipidemic)) .... Take 2 tablet daily 4)  Atenolol 100 Mg Tabs (Atenolol) .Marland Kitchen.. 1 by mouth qd 5)  [employer] Ec Aspirin 325 Mg Tbec  (Aspirin) .... Take one tablet. 6)  Centrum Chew (Multiple vitamins-minerals) .... Take one daily. 7)  One Touch Lancets Misc (Lancets) .... Use as directed twice daily 8)  One Touch Ultra2 Meter  .... Use as directed 9)  One Touch Ultra2 Testing Strips  .... Use one strip in glucometer twice daily as directed.  Patient Instructions: 1)  Please schedule a follow-up appointment in 4 months .  Prescriptions: LIPITOR 10 MG TABS (ATORVASTATIN CALCIUM) 1 a day  #90 x 3   Entered by:   Army Fossa CMA   Authorized by:   Nolon Rod. Egan Sahlin MD   Signed by:   Nolon Rod. Ladasia Sircy MD on 10/06/2010   Method used:   Electronically to        [id number] (retail)       [address]       [place], [place]  [address]       Ph: [phone] or [phone]       Fax: [phone]   RxID:   [id number]    Orders Added: 1)  Venipuncture [36415] 2)  TLB-A1C / Hgb A1C (Glycohemoglobin) [83036-A1C] 3)  Est. Patient Level III [66440]   Immunization History:  Influenza Immunization History:    Influenza:  historical (08/10/2010)   Immunization History:  Influenza Immunization History:    Influenza:  Historical (08/10/2010)

## 2011-01-11 NOTE — Assessment & Plan Note (Signed)
Summary: CPX AND FASTING LABS////SPH   Vital Signs:  Patient profile:   50 year old male Height:      66.5 inches Weight:      215.8 pounds BMI:     34.43 Pulse rate:   70 / minute Pulse rhythm:   regular BP sitting:   152 / 90  (left arm) Cuff size:   large  Vitals Entered By: Shary Decamp (January 13, 2010 9:39 AM) CC: cpx - fasting Is Patient Diabetic? Yes Comments  - last DM eye exam was last year  - avg fasting glucose - 114 .Marland KitchenMarland KitchenShary Decamp  January 13, 2010 9:50 AM    History of Present Illness: CPX HTN-- ambulatory BPs 120s/85 DM-- ambulatory CBGs in the low  100s chol-- good medication compliance , no aparent s/e   Preventive Screening-Counseling & Management  Alcohol-Tobacco     Alcohol drinks/day: 0     Smoking Status: never  Caffeine-Diet-Exercise     Does Patient Exercise: no  Allergies: No Known Drug Allergies  Past History:  Past Medical History: Reviewed history from 05/03/2009 and no changes required. Hyperlipidemia Hypertension Diabetes mellitus, type II, Dx 2008 aprox  Past Surgical History: Reviewed history from 01/07/2008 and no changes required. Denies surgical history  Family History: Reviewed history from 01/03/2009 and no changes required. 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 CV:  Denies chest pain or discomfort and swelling of feet. Resp:  Denies cough and shortness of breath. GI:  Denies bloody stools, diarrhea, nausea, and vomiting. GU:  Denies dysuria, urinary frequency, and urinary hesitancy.  Physical Exam  General:  alert and overweight-appearing.   Head:  chronic-looking skin lesion at the left cheek Neck:  normal carotid upstroke.     Lungs:  normal respiratory effort, no intercostal retractions, no accessory muscle use, and normal breath sounds.   Heart:  normal rate, regular rhythm, and no murmur.   Abdomen:  soft, non-tender, no distention, no masses, no guarding, and no rigidity.   Extremities:  no pretibial edema bilaterally   Diabetes Management Exam:    Foot Exam (with socks and/or shoes not present):       Sensory-Pinprick/Light touch:          Left medial foot (L-4): normal          Left dorsal foot (L-5): normal          Left lateral foot (S-1): normal          Right medial foot (L-4): normal          Right dorsal foot (L-5): normal          Right lateral foot (S-1): normal       Sensory-Monofilament:          Left foot: normal          Right foot: normal       Inspection:          Left foot: abnormal             Comments: dry skin          Right foot: abnormal             Comments: dry skin       Nails:  Left foot: thickened          Right foot: thickened   Impression & Recommendations:  Problem # 1:  PREVENTIVE HEALTH CARE (ICD-V70.0) Td 2005 Pneumonia shot 2008 had a flu  shots already plans  to exercise more,praised  labs     Orders: Venipuncture (16109) TLB-Hepatic/Liver Function Pnl (80076-HEPATIC) TLB-BMP (Basic Metabolic Panel-BMET) (80048-METABOL) TLB-CBC Platelet - w/Differential (85025-CBCD)  Problem # 2:  SKIN LESION (ICD-709.9) skin lesion of the left cheek is going on for months, occ.  bleeding to dermatology, biopsy?  Orders: Dermatology Referral (Derma)  Problem # 3:  DIABETES MELLITUS, TYPE II (ICD-250.00)  printed material provided regards feet care  labs  His updated medication list for this problem includes:    Albertsons Ec Aspirin 325 Mg Tbec (Aspirin) .Marland Kitchen... Take one tablet.  Orders: TLB-Microalbumin/Creat Ratio, Urine (82043-MALB) TLB-A1C / Hgb A1C (Glycohemoglobin) (83036-A1C)  Labs Reviewed: Creat: 0.8 (01/03/2009)     Last Eye Exam:  normal (06/09/2008) Reviewed HgBA1c results: 6.3 (05/03/2009)  6.3 (01/03/2009)  Problem # 4:  HYPERTENSION (ICD-401.9)  well-controlled? His BP has been elevated the last two visits, reports good ambulatory blood pressures no change reassess on  return to the office His updated medication list for this problem includes:    Atenolol 100 Mg Tabs (Atenolol) .Marland Kitchen... 1 by mouth qd  BP today: 152/90 Prior BP: 140/100 (08/25/2009)  Prior 10 Yr Risk Heart Disease: 14 % (08/25/2009)  Labs Reviewed: K+: 4.1 (01/03/2009) Creat: : 0.8 (01/03/2009)   Chol: 118 (08/22/2009)   HDL: 33.40 (08/22/2009)   LDL: 60 (08/22/2009)   TG: 122.0 (08/22/2009)  Problem # 5:  HYPERLIPIDEMIA (ICD-272.4) follow-up by the lipid clinic His updated medication list for this problem includes:    Lipitor 10 Mg Tabs (Atorvastatin calcium) .Marland Kitchen... 1 a day    Niaspan 1000 Mg Tbcr (Niacin (antihyperlipidemic)) .Marland Kitchen... Take 2 tablet daily  Labs Reviewed: SGOT: 31 (08/22/2009)   SGPT: 37 (08/22/2009)  Lipid Goals: Chol Goal: 200 (03/10/2009)   HDL Goal: 40 (03/10/2009)   LDL Goal: 100 (03/10/2009)   TG Goal: 150 (03/10/2009)  Prior 10 Yr Risk Heart Disease: 14 % (08/25/2009)   HDL:33.40 (08/22/2009), 30.50 (03/03/2009)  LDL:60 (08/22/2009), 61 (03/03/2009)  Chol:118 (08/22/2009), 126 (03/03/2009)  Trig:122.0 (08/22/2009), 171.0 (03/03/2009)  Complete Medication List: 1)  Lipitor 10 Mg Tabs (Atorvastatin calcium) .Marland Kitchen.. 1 a day 2)  Omega-3 1000 Mg Caps (Omega-3 fatty acids) .... Take 2 caps bid 3)  Niaspan 1000 Mg Tbcr (Niacin (antihyperlipidemic)) .... Take 2 tablet daily 4)  Atenolol 100 Mg Tabs (Atenolol) .Marland Kitchen.. 1 by mouth qd 5)  Albertsons Ec Aspirin 325 Mg Tbec (Aspirin) .... Take one tablet. 6)  Centrum Chew (Multiple vitamins-minerals) .... Take one daily. 7)  One Touch Lancets Misc (Lancets) .... Use as directed twice daily 8)  One Touch Ultra2 Meter  .... Use as directed 9)  One Touch Ultra2 Testing Strips  ....  Use one strip in glucometer twice daily as directed.  Patient Instructions: 1)  Please schedule a follow-up appointment in 4 months .  Prescriptions: ATENOLOL 100 MG TABS (ATENOLOL) 1 by mouth qd  #90 x 3   Entered by:   Shary Decamp   Authorized by:   Nolon Rod. Jermiyah Ricotta MD   Signed by:   Shary Decamp on 01/13/2010   Method used:   Electronically to        [facility]* (mail-order)             ,  Ph: [phone]       Fax: [phone]   RxID:   [id number]    Preventive Care Screening  Prior Values:    PSA:  0.39 (01/07/2008)    Last Tetanus Booster:  given (02/25/2004)    Last Pneumovax:  Pneumovax (10/01/2007)    Immunization History:  Influenza Immunization History:    Influenza:  historical (09/09/2009)

## 2011-02-09 ENCOUNTER — Encounter: Payer: Self-pay | Admitting: Internal Medicine

## 2011-02-09 ENCOUNTER — Other Ambulatory Visit: Payer: Self-pay | Admitting: Internal Medicine

## 2011-02-09 ENCOUNTER — Ambulatory Visit (INDEPENDENT_AMBULATORY_CARE_PROVIDER_SITE_OTHER): Payer: BC Managed Care – PPO | Admitting: Internal Medicine

## 2011-02-09 DIAGNOSIS — Z0000 Encounter for general adult medical examination without abnormal findings: Secondary | ICD-10-CM

## 2011-02-09 LAB — CBC WITH DIFFERENTIAL/PLATELET
Basophils Relative: 0.4 % (ref 0.0–3.0)
Eosinophils Relative: 2.4 % (ref 0.0–5.0)
HCT: 45.6 % (ref 39.0–52.0)
Hemoglobin: 15.8 g/dL (ref 13.0–17.0)
Lymphocytes Relative: 24.2 % (ref 12.0–46.0)
Lymphs Abs: 2 10*3/uL (ref 0.7–4.0)
Monocytes Relative: 4.4 % (ref 3.0–12.0)
Neutro Abs: 5.8 10*3/uL (ref 1.4–7.7)
RBC: 4.82 Mil/uL (ref 4.22–5.81)
WBC: 8.4 10*3/uL (ref 4.5–10.5)

## 2011-02-15 NOTE — Assessment & Plan Note (Signed)
Summary: 4 MONTH FU/SPH   Vital Signs:  Patient profile:   50 year old male Height:      66.5 inches Weight:      207.38 pounds BMI:     33.09 Pulse rate:   76 / minute Pulse rhythm:   regular BP sitting:   136 / 84  (left arm) Cuff size:   large  Vitals Entered By: Army Fossa CMA (February 09, 2011 7:52 AM) CC: 4 month f/u- fasting  Comments no complaints  [facility]   History of Present Illness: CPX  Current Medications (verified): 1)  Lipitor 10 Mg Tabs (Atorvastatin Calcium) .Marland Kitchen.. 1 By Mouth At Bedtime. 2)  Omega-3 1000 Mg Caps (Omega-3 Fatty Acids) .... Take 2 Caps Bid 3)  Niaspan 1000 Mg Tbcr (Niacin (Antihyperlipidemic)) .... Take 2 Tablet Daily 4)  Atenolol 100 Mg Tabs (Atenolol) .Marland Kitchen.. 1 By Mouth Qd 5)  Albertsons Ec Aspirin 325 Mg  Tbec (Aspirin) .... Take One Tablet. 6)  Centrum   Chew (Multiple Vitamins-Minerals) .... Take One Daily. 7)  One Touch Lancets   Misc (Lancets) .... Use As Directed Twice Daily 8)  One Touch Ultra2 Meter .... Use As Directed 9)  One Touch Ultra2 Testing Strips .... Use One Strip in Glucometer Twice Daily As Directed. 10)  Metformin Hcl 500 Mg Tabs (Metformin Hcl) .Marland Kitchen.. 1 Po  Two Times A Day.  Allergies (verified): No Known Drug Allergies  Past History:  Past Medical History: Reviewed history from 05/15/2010 and no changes required. Hyperlipidemia Hypertension Diabetes mellitus, type II, Dx 2008 aprox  Past Surgical History: Reviewed history from 01/07/2008 and no changes required. Denies surgical history  Family History: MI--no DM--F Prostate ca-- GF dx late 70s colon ca--no  Social History: Reviewed history from 05/15/2010 and no changes required. 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 CV:  Denies chest pain or discomfort and swelling of feet; ambulatory BPs  ~  130/80s. Resp:  Denies cough and shortness of breath. GI:  Denies bloody stools, nausea, and vomiting. GU:  Denies dysuria, hematuria, urinary frequency, and urinary hesitancy. MS:  Denies muscle aches. Endo:  ambulatory CBGs 90-100.  Physical Exam  General:  alert and well-developed.  has lost some wt Neck:  no masses, no thyromegaly, and normal carotid upstroke.   Lungs:  normal respiratory effort, no intercostal retractions, no accessory muscle use, and normal breath sounds.   Heart:  normal rate, regular rhythm, and no murmur.   Abdomen:  soft and non-tender.  no masses and no guarding.   Rectal:  No external abnormalities noted. Normal sphincter tone. No rectal masses or tenderness. hem neg Prostate:  Prostate gland firm and smooth, no enlargement, nodularity, tenderness, mass, asymmetry or induration. Extremities:  no pretibial edema bilaterally  Psych:  Oriented X3, memory intact for recent and remote, normally interactive, good eye contact, not anxious appearing, and not depressed appearing.     Impression & Recommendations:  Problem # 1:  PREVENTIVE HEALTH CARE (ICD-V70.0) Td 2005 Pneumonia shot 2008   diet, exercise discussed   never had a cscope, will discuss at age 43 check a PSA , hemocult neg  Orders: Venipuncture (16109) TLB-CBC Platelet - w/Differential (85025-CBCD) TLB-PSA (Prostate Specific Antigen) (84153-PSA) Specimen Handling (60454) EKG w/ Interpretation (93000)  Complete Medication List: 1)  Metformin Hcl 500 Mg Tabs (Metformin hcl) .Marland KitchenMarland KitchenMarland Kitchen  1 po  two times a day. 2)  Lipitor 10 Mg Tabs (Atorvastatin calcium) .Marland Kitchen.. 1 by mouth at bedtime. 3)  Omega-3 1000 Mg Caps (Omega-3 fatty acids) .... Take 2 caps bid 4)  Niaspan 1000 Mg Tbcr (Niacin (antihyperlipidemic)) .... Take 2 tablet daily 5)  Atenolol 100 Mg Tabs (Atenolol) .Marland Kitchen.. 1 by mouth qd 6)  Albertsons Ec Aspirin 325 Mg Tbec (Aspirin) .... Take one tablet. 7)  Centrum Chew (Multiple vitamins-minerals) ....  Take one daily. 8)  One Touch Lancets Misc (Lancets) .... Use as directed twice daily 9)  One Touch Ultra2 Meter  .... Use as directed 10)  One Touch Ultra2 Testing Strips  .... Use one strip in glucometer twice daily as directed.  Patient Instructions: 1)  Please schedule a follow-up appointment in 4 months .  Prescriptions: LIPITOR 10 MG TABS (ATORVASTATIN CALCIUM) 1 by mouth at bedtime.  #90 x 1   Entered by:   Army Fossa CMA   Authorized by:   Nolon Rod. Fabiola Mudgett MD   Signed by:   Army Fossa CMA on 02/09/2011   Method used:   Electronically to        [facility]* (retail)             ,          Ph: [phone]       Fax: [phone]   RxID:   [id number] METFORMIN HCL 500 MG TABS (METFORMIN HCL) 1 po  two times a day.  #180 x 1   Entered by:   Army Fossa CMA   Authorized by:   Nolon Rod. Rhian Funari MD   Signed by:   Army Fossa CMA on 02/09/2011   Method used:   Electronically to        [facility]* (retail)             ,          Ph: [phone]       Fax: [phone]   RxID:   [id number]    Orders Added: 1)  Venipuncture [75643] 2)  TLB-CBC Platelet - w/Differential [85025-CBCD] 3)  TLB-PSA (Prostate Specific Antigen) [32951-OAC] 4)  Specimen Handling [99000] 5)  EKG w/ Interpretation [93000] 6)  Est. Patient age 70-64 [phone]

## 2011-04-24 NOTE — Assessment & Plan Note (Signed)
[facility]                               LIPID CLINIC NOTE   Manuel, [name]                   MRN:          [id number]  DATE:06/17/2008                            DOB:          11-29-1961    Return office visit for Lipid Clinic.   PAST MEDICAL HISTORY:  Hyperlipidemia, diabetes mellitus, and  hypertension.   MEDICATIONS:  1. Fish oil 2 grams twice daily.  2. Multivitamin daily.  3. Aspirin 325 mg daily.  4. Atenolol 100 mg daily.  5. Niaspan 2 grams at bedtime.  6. Lipitor 10 mg daily.   PHYSICAL EXAMINATION:  VITAL SIGNS:  Weight 213 pounds, blood pressure  122/82, and heart rate 76.   LABORATORY DATA:  Total cholesterol 124, triglyceride 176, HDL 31, and  LDL 57.  LFTs within normal limits.   ASSESSMENT:  Mr. Doenges is a very pleasant 50 year old gentleman who  returns to Lipid Clinic today with no chest pain, no shortness of  breath, and no muscle aches or pains.  He is compliant with current  medication regimen.  I have commended him on his lifestyle change.  He  has just recently begun running 1-1/2 miles 4 times a week.  He started  out with a walk and has gradually increased himself up to a just slow  jog over the last 3 months.  Although, he has not had any weight loss  along with this new exercise regimen, I anticipate that he certainly  will in the next upcoming months.  He also has tried to decrease the  fats in his diet.  He has switched from fried fast food for lunch every  day to having a salad and baked potato 2 days a week and has fried food  on the other days.  When at home on the weekends, he is usually having  some type of baloney sandwich or a pimento cheese sandwich.  I have  encouraged him to use a low-fat lean meat, ham or [place] for his  sandwiches and decreasing the amount of mayonnaise on his sandwich and  decreasing the amount of salad dressing, he is on his salad.  He eats a  very healthy home-cooked meal  for his evening meal and states that he  does not snack in the evening.  His total cholesterol goal less than  200.  Triglycerides greater than goal of less than 150.  HDL is less  than goal of greater than 40.  However, improved since last visit, and  his LDL is at goal of less than 70.   PLAN:  1. Continue current medication.  2. Continue exercise regimen and nevertheless continue working to      decrease fats in diet.  3. Followup visit in 6 months for lipid panel and LFTs, we will make      adjustments at that time.      Leota Sauers, PharmD  Electronically Signed      Jesse Sans. Daleen Squibb, MD, [facility]  Electronically Signed   LC/MedQ  DD: 06/17/2008  DT: 06/18/2008  Job #: [id number]

## 2011-04-24 NOTE — Assessment & Plan Note (Signed)
[facility]                               LIPID CLINIC NOTE   [name], Vital LENFORD BEDDOW                   MRN:          [id number]  DATE:10/23/2007                            DOB:          12-16-1960    Return office visit for lipid clinic.   PAST MEDICAL HISTORY:  1. Hyperlipidemia.  2. Diabetes mellitus.  3. Hypertension.   MEDICATIONS:  1. Fish oil 2000 mg twice daily.  2. Multivitamin daily.  3. Aspirin 325 mg daily.  4. Atenolol 100 mg daily.  5. Niaspan 2000 mg at bedtime.  6. Lipitor 10 mg daily.   VITAL SIGNS:  Weight 211 pounds. Blood pressure 122/70, heart rate 80.   LAB DATA:  Total cholesterol of 125, triglycerides 108, HDL 35, LDL 68.  LFTs within normal limits.   ASSESSMENT:  Mr. Makarewicz is a fairly young gentleman who returns to the  lipid clinic today with no chest pain, no shortness of breath, no muscle  aches or pain.  He is compliant with the current medication regimen.  Although he does not follow total low fat, low carbohydrate diet, he has  made large strides in his previously high fat content diet.  He has  decreased the amount of times going to [employer] for fried chicken  lunches to three times a week.  The other two days a week while at work,  he has started going to [employer] for a grilled chicken sandwich but no  [place] fries or other things on it.  He occasionally goes home and eats  a low fat lunch.  He has decreased the amount of sodas in his diet and  increased the amount of water.  He also has decreased the amount of high  fat snacks that he was having, potato chips, dips, etc.  He does snack  in the evening on lots of pretzels, popcorn, candy, cakes, cookies,  etc., but he has decreased the amount.  Although he has made great  strides, we still have room to go for healthy alternatives to his  snacking.  He eats Cheerios for breakfast.  He eats, as stated before,  his fried chicken or grilled chicken for  lunch.  An evening meal is  usually pinto beans or a ham or [place] sandwich.  He gets very little  fruits and vegetables in his diet, which he is willing to add as an  evening snack.  I have given him many healthier alternatives to his  chips, pretzels, cakes, etc.  He is willing to add some apples, fruit,  or vegetables, carrots, etc.  He walks 35-45 minutes four times a week,  which is an improvement from previous where he was not walking at all or  doing any type of exercise.  He then started walking about 30 minutes  and has now increased that length of time to 45 minutes 45 days per  week.  I will continue to encourage him with his lifestyle  modifications.  His total cholesterol at goal is at 123, triglycerides  at 158, which is greater than a goal of less  than 150.  HDL is 31, which  is less than goal of greater than 40, and decreased from previous visit  at 135.  His LDL is 60, which is improved from the previous visit and at  goal of less than 70.   PLAN:  1. Our plan is to follow up in six months for lipid panel and LFTs. We      will make medication adjustments at that time.  2. Continue exercise regimen.  3. Continue low fat, low carbohydrate diet with increase in fruits and      vegetables.  4. Continue current medication regimen.      Leota Sauers, PharmD  Electronically Signed      Jesse Sans. Daleen Squibb, MD, [facility]  Electronically Signed   LC/MedQ  DD: 10/23/2007  DT: 10/24/2007  Job #: [id number]

## 2011-04-24 NOTE — Assessment & Plan Note (Signed)
[facility]                               LIPID CLINIC NOTE   [name], [name]                   MRN:          [id number]  DATE:03/25/2008                            DOB:          [birth year]    Return Office Visit for Lipid Clinic   PAST MEDICAL HISTORY:  1. Hyperlipidemia.  2. Diabetes mellitus.  3. Hypertension.   MEDICATIONS:  1. Fish oil 2000 mg twice daily.  2. Multivitamin daily.  3. Aspirin 325 daily.  4. Atenolol 100 mg daily.  5. Niaspan 2 g at bedtime.  6. Lipitor 10 mg daily.   PHYSICAL EXAMINATION:  VITAL SIGNS:  Weight 213 pounds, blood pressure  125/80, heart rate 70.   LABORATORY DATA:  Total cholesterol 112, triglyceride 147, HDL 28, LDL  54.  LFTs within normal limits.   ASSESSMENT:  [name] is a very pleasant 50 year old gentleman who  returns to Lipid Clinic today with no chest pain, no shortness of  breath, no muscle aches or pains.  He is compliant with current  medication regimen.  He has been very noncompliant with his exercise  regimen.  He had previously been walking 35-45 minutes approximately 4x  a week.  He has had surgery on his ingrown toenails in February, and has  been in a lot of pain since.  The pain is subsiding, and he plans on  restarting his exercise program in the next 3 or 4 weeks, once his pain  has resolved from the surgery.  He has been following a fairly low-fat,  low-carbohydrate diet in the interim; however, his triglycerides are  increased, and his HDL has dropped since last visit; but this could be  reflected from his lack of exercise.  I do feel that with restarting of  the exercise program that [name] will have a more favorable lipid  panel at next office visit.  His total cholesterol is at goal of less  than 200, triglycerides at goal of less than 150.  Good cholesterol is  less than goal of greater than 40, and LDL goal of less than 70.  He is  at maximum dose of his Niaspan  and I do not feel that we need to go up  on his Lipitor given that his LDL is already in the 50s.  I will  continue to encourage exercise regimen, and lifestyle modification.   PLAN:  1. Continue current medication.  2. Restart exercise regimen.  3. Continue low-fat, low-carbohydrate diet.  4. Followup visit in 3 months for lipid panel and LFTs, and make      adjustments as needed, at that time.      [name], PharmD  Electronically Signed      [name]. Daleen Squibb, MD, [facility]  Electronically Signed   LC/MedQ  DD: 03/25/2008  DT: 03/25/2008  Job #: [id number]

## 2011-04-24 NOTE — Assessment & Plan Note (Signed)
[facility]                               LIPID CLINIC NOTE   [name], [name]                   MRN:          [id number]  DATE:06/19/2007                            DOB:          1961/01/18    FIRST VISIT TO LIPID CLINIC   PAST MEDICAL HISTORY:  Hyperlipidemia, diabetes mellitus, hypertension.   MEDICATIONS:  1. Fish oil 2 g twice daily.  2. Multivitamin daily.  3. Aspirin 325 mg daily.  4. Gemfibrozil 600 mg twice daily.  5. Atenolol 100 mg daily.  6. Niaspan 2 g at bedtime.   VITAL SIGNS:  Weight 201 pounds, blood pressure 122/80, heart rate 70.   LABORATORY DATA:  LDL of 142, total cholesterol 176, triglyceride 205,  HDL 36.  AST 40, ALT 50.   ASSESSMENT:  [name] is a pleasant, 50 year old gentleman, who comes to  lipid clinic today, referred by his primary physician.  He is compliant  with his current medications, does not report any myalgias or side  effects from current medications.  He has no chest pain, no shortness of  breath.  He exercises 30 minutes a day by walking approximately one  mile.  He does not follow a very heart healthy diet.  He eats two  sausage biscuits for breakfast with a Diet Pepsi.  He eats fried chicken  at [employer] with a Diet Pepsi for lunch and eats some type of  vegetables and carbohydrate with a Diet Pepsi for his evening meal.  He  very rarely snacks in between meals and occasionally eats ice cream in  the evening time.  He says that he has decreased the amount of sodas  that he had previously been drinking.  He also has switched to Diet  Pepsi over the last three months and has added three 20-ounce bottles of  water a day.  Given this, he still has much room to make changes for  lifestyle modification.   We discussed the benefits of diet and exercise and he is willing to make  changes in his diet.  We have agreed to eat sausage biscuits four days a  week, instead of daily, and to decrease the  amount of [employer] to three  times a week and add in healthy alternatives, such as grilled chicken  sandwiches, grilled chicken salads, [employer] sandwiches, etc., for his  lunch, which he mostly eats out, due to his job, and to continue a  fairly healthy low-fat, low-carbohydrate evening meal.  He also is  agreeable to increasing the pace of his walking, so that, in his 30  minutes, he will go approximately a mile and a half to two miles, and  will consistently walk two miles three times during his week.  His total  cholesterol is a goal of less than 200, triglycerides greater than goal  of less than 150, HDL less than a goal of greater than 40 and LDL  greater than goal of less than 70, given his diabetes history.   He, in the past, has been on Zocor and Pravachol.  He says that he  remembers no problems with these medications, except that he switched  primary physicians and they, in turn, made medication changes to his  statins.  He does have history of fatty liver with slightly elevated  LFTs, but he does not remember any changes in his LFTs when being on  statin medications.   PLAN:  1. Discontinue gemfibrozil and begin Lipitor 10 mg daily, continuing      other medications.  2. Increase exercise to two miles three times a week and increase pace      of his 30-minute walk.  3. Decrease fats in diet.  4. Followup visit in six weeks for LFTs and lipid panel and make      adjustments that will be needed at that time.      Leota Sauers, PharmD  Electronically Signed      Jesse Sans. Daleen Squibb, MD, [facility]  Electronically Signed   LC/MedQ  DD: 06/19/2007  DT: 06/20/2007  Job #: [id number]

## 2011-04-24 NOTE — Assessment & Plan Note (Signed)
[facility]                               LIPID CLINIC NOTE   [name], [name]                   MRN:          [id number]  DATE:07/31/2007                            DOB:          [birth year]    PAST MEDICAL HISTORY:  1. Hyperlipidemia.  2. Diabetes mellitus.  3. Hypertension.   MEDICATIONS:  1. Fish oil 2 grams daily.  2. Multivitamin daily.  3. Aspirin 325 mg daily.  4. Atenolol 100 mg daily.  5. Niaspan 2 grams daily.  6. Lipitor 10 mg daily.   VITAL SIGNS:  Weight 205 pounds, blood pressure 122/78, heart rate 72.   LABORATORY DATA:  Total cholesterol 125, triglyceride 108, HDL 35, LDL  68.  LFTs within normal limits.   ASSESSMENT:  [name] is a very pleasant 50 year old man who is  compliant with current medication regimen.  He comes to the lipid clinic  today with no chest pain, no shortness of breath, no muscle aches or  pains.  He has made significant strides in his exercise where he walks 1  mile each morning, 30-35 minutes.  I will encourage him to quicken the  pace on his walking.  He has also made significant strides in his diet  as well.  He has stopped eating biscuits in the morning and eats  Cheerios for breakfast.  He continues to eat [employer] chicken  sandwiches for lunch 4 times a week and on the fifth day he goes home  and eats a healthier lunch.  His evening meal consists usually of a  sandwich.  He has decreased his diet Pepsi to 3 times a week and drinks  water in between there.  At this point his total cholesterol is at goal  of less than 200, triglycerides at goal of less than 150, HDL less than  goal of greater than 40, LDL is at goal of less than 70, non-HDL is at  goal of less than 100.   PLAN:  1. Continue current medication therapy.  2. Decrease Bo[employer] chicken sandwiches and fried foods and increase      the amount of vegetables and lean healthy meat in his diet.  3. Continue exercise regimen,  however I would encourage him to quicken      the pace of his mile walk every morning.  4. Followup visit in 3 months for lipid panel and LFTs and we will      make adjustments at that time.      Leota Sauers, PharmD  Electronically Signed      Jesse Sans. Daleen Squibb, MD, [facility]  Electronically Signed   LC/MedQ  DD: 07/31/2007  DT: 08/01/2007  Job #: [id number]

## 2011-04-24 NOTE — Assessment & Plan Note (Signed)
[facility]                               LIPID CLINIC NOTE   Irwin, [name]                   MRN:          [id number]  DATE:11/18/2008                            DOB:          1961-08-28    Return office visit for Lipid Clinic.   PAST MEDICAL HISTORY:  Hyperlipidemia, diabetes mellitus, and  hypertension.   PHYSICAL EXAMINATION:  VITAL SIGNS:  Weight is 215 pounds, blood  pressure is 122/88, and heart rate is 80.   LABORATORY DATA:  Total cholesterol is 127, triglyceride 207, HDL 30,  and LDL 70.  LFTs within normal limits.   ASSESSMENT:  Mr. Shells is a pleasant gentleman who returns to Lipid  Clinic today with no chest pain, no shortness of breath, no muscle  aches, and pains.  He states compliance with current medication regimen.  He does state noncompliance to lifestyle modification.  He states that  he has not been exercising like he should.  He occasionally does a  walk/run on the treadmill for about 30 minutes a day.  He stated he gets  about a mile and half in which makes me question how much he is actually  running.  This must be a very slow pace.  He states that he tries to  cook a fairly low-fat and low-carbohydrate on the evening meal, but his  lunchtime meal consists of Bojangles on a daily basis twice a week, and  he tries to order a grilled chicken versus the fried chicken sandwich.  We had a lengthy conversation regarding his lifestyle modification.  However, I feel that with his lipid panel being at goal that it is much  harder for him to understand the benefit of lifestyle modification, his  total cholesterol is at goal of less than 200, triglycerides are greater  than goal of less than 150, and an increase since last visit.  His LDL  is at 70, which is the upper limit of his goal being less than 70  increased since last visit of 57 and his HDL has dropped from 31 to 30  being less than his goal of greater than 40.  He  does state that he has  been busy lately and has gotten lazy and increased his snacking,  especially on Cheetos or cheese curls in the evening time.  We addressed  this issue and gave him plenty of alternatives for fruits and vegetables  for snacking, given him alternatives for his lunchtime meals.  However,  it does seem to be a more convenient factor to run across the street to  get Bojangles than to go somewhere else that would be healthier meal for  him.  I will continue to encourage lifestyle modification.  He does  understand that if his LDL has not improved at next visit that we will  be increasing his Lipitor to 20 mg daily, and he is agreeable to that.   PLAN:  1. Continue current medication regimen.  2. Increase exercise and decrease fats in diet.  3. Followup visit in 3 months for lipid panel and LFTs.  We will make      changes at that time.      Leota Sauers, PharmD  Electronically Signed      Jesse Sans. Daleen Squibb, MD, [facility]  Electronically Signed   LC/MedQ  DD: 11/18/2008  DT: 11/19/2008  Job #: [id number]

## 2011-04-27 NOTE — Assessment & Plan Note (Signed)
[facility]                         GASTROENTEROLOGY OFFICE NOTE   [name], [name]                   MRN:          [id number]  DATE:01/17/2007                            DOB:          Oct 24, 1961    This is a return office visit for elevated transaminases.  Serologic  workup was entirely negative and findings are all consistent with fatty  infiltration of the liver.  He feels well, without any complaints.   Medications listed on the chart - updated and reviewed.   MEDICATION ALLERGIES:  None known.   EXAM:  No acute distress.  Weight 212.4 pounds, blood pressure 122/80,  pulse 80 and regular.  He is not re-examined.   ASSESSMENT AND PLAN:  Fatty infiltration of the liver with mild  transaminase abnormalities.  His transaminases should be monitored on an  interval basis, such as every 6 months.  He is given all standard  recommendations for fatty infiltration of the liver, including optimal  management of lipids, a long-term low-fat diet and weight-loss program.  Ongoing followup with Dr. Blossom Hoops.  I will see back on referral.     Venita Lick. Russella Dar, MD, [facility]  Electronically Signed    MTS/MedQ  DD: 01/17/2007  DT: 01/17/2007  Job #: [id number]   cc:   Leanne Chang, M.D.

## 2011-04-27 NOTE — Assessment & Plan Note (Signed)
[facility]                          [place] OFFICE NOTE   Turki, Tapanes NOA CONSTANTE                   MRN:          [id number]  DATE:09/13/2006                            DOB:          Feb 12, 1961    REASON FOR VISIT:  Establish care.   [name] is a 50 year old male with a history of hypertension and  hyperlipidemia, specifically elevated triglycerides.  Patient states that  his blood pressure typically runs in the 120s/70s to 80.  He has no specific  complaints.  Last appointment was with Dr. Modesto Charon back in April or May.   PAST MEDICAL HISTORY:  1. Hypertension.  2. Hyperlipidemia, specifically elevated triglyceride.   MEDICATIONS:  1. Gemfibrozil 60 mg b.i.d.  2. Niaspan 2000 g nightly.  3. Atenolol 100 mg daily.  4. Centrum.  5. Omega III fish oil capsule 4 daily.  6. Aspirin 325 mg.   ALLERGIES:  NO KNOWN DRUG ALLERGIES.   FAMILY HISTORY:  Father alive with a history of hypertension.  Mother alive  with a history of hypertension.  He has three sisters alive and well.  Patient works at [employer].  He is single with no children.  Denies  any tobacco use and rarely drinks alcohol.   REVIEW OF SYSTEMS:  As per HPI, otherwise unremarkable.   OBJECTIVE:  GENERAL APPEARANCE:  A pleasant, mildly overweight male in no  acute distress.  Answers questions appropriately.  VITAL SIGNS:  Weight 206, temperature 97.3, pulse 68, blood pressure 132/98.  NECK:  Supple, no lymphadenopathy, carotid bruits or JVD.  No thyromegaly  noted.  LUNGS:  Clear with good air movement.  CARDIOVASCULAR:  Regular rate and rhythm, normal S1 and S2, no murmurs,  rubs, or gallops.  ABDOMEN:  Obese.  EXTREMITIES:  No clubbing, cyanosis, or edema.   IMPRESSION:  1. Hypertension.  2. Hyperlipidemia.   PLAN:  1. Will obtain a fasting lipid panel, AST, ALT and BMET.  2. Since his blood pressure was borderline today, will have him follow up      again  in four weeks for reassessment.  I did recommend that he buy      himself a blood pressure monitor for home and check his blood pressure      three times a week and keep a log.  He is also advised to bring in his      blood pressure monitor at the next visit so we can make sure we are      calibrated the same.  3. Further recommendations after review of laboratory data.  4. Patient will have his medical records transferred to our office.            ______________________________  Leanne Chang, M.D.      LA/MedQ  DD:  09/13/2006  DT:  09/16/2006  Job #:  [id number]

## 2011-04-27 NOTE — Assessment & Plan Note (Signed)
[facility]                         GASTROENTEROLOGY OFFICE NOTE   [name], [name]                   MRN:          [id number]  DATE:12/16/2006                            DOB:          January 17, 1961    REASON FOR REFERRAL:  Elevated function tests and fatty liver.   HISTORY OF PRESENT ILLNESS:  [name] is a 50 year old white male  referred at the courtesy of Dr. Blossom Hoops.  [name] apparently had mild  transaminase abnormalities noted in 2005.  Repeat testing later in 2005  and at several times during 2006 showed normal liver function tests.  Blood work from September 13, 2006 showed an elevated AST and an elevated  ALT at 51.  Abdominal ultrasound imaging from 11/21/2006 showed a  severely increased hepatic echodensity with decreased vascularity and  poor definition.  Hepatitis A, B, and C serologies were negative in July  2005.  Ceruloplasmin and alpha 1 antitrypsin levels were normal; and an  ANA was negative also in July 2005.  There is no family history of liver  disease and [name] has no gastrointestinal complaints and specifically  denies any history of liver disorders or any family history of liver [name]  has no history of tattoos, needle sticks, intravenous drug usage, blood  transfusions or known exposure to persons with hepatitis or jaundice.   PAST MEDICAL HISTORY:  Hyperlipidemia, diabetes.   PAST SURGICAL HISTORY:  Negative.   CURRENT MEDICATIONS:  Listed on the chart.   MEDICATION ALLERGIES:  None known.   SOCIAL HISTORY:  [name] denies alcohol and tobacco product usage.  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   REVIEW OF SYSTEMS:  Negative as per the handwritten form.   PHYSICAL EXAMINATION:  An overweight white male in no acute distress.  Height 5 feet 8 inches.  Weight 213.2 pounds.  Blood pressure 122/84, pulse 72 and regular.  HEENT EXAM:  Anicteric sclerae.  Oropharynx clear.  CHEST:  Clear to  auscultation bilaterally.  CARDIAC:  Regular rate and rhythm without murmurs appreciated.  ABDOMEN:  Soft, nontender, nondistended.  Normoactive bowel sounds.  No  palpable organomegaly, masses or hernias.  Liver span is 12 cm by  percussion and scratch testing in the right midclavicular line.  NEUROLOGIC:  Alert and oriented x3.  Grossly nonfocal.   ASSESSMENT/PLAN:  Mild transaminase abnormalities and findings  consistent with fatty infiltration of the liver on ultrasound imaging.  We will plan to repeat all standard viral and metabolic serologies to  R/O other underlying liver diseases, but fatty liver with possible  steatohepatitis is the leading diagnosis.  [name] is advised to begin a long-  term weight loss program and to maintain close followup with Dr.  Blossom Hoops regarding long-term management of his hyperlipidemia and  diabetes.  Consider a liver biopsy in the future if his transaminases  remain elevated for over 6-12 months.  Return office with me in 4-6  weeks.     Venita Lick. Russella Dar, MD, [facility]  Electronically Signed    MTS/MedQ  DD: 12/16/2006  DT: 12/16/2006  Job #: [id number]  cc:   Leanne Chang, M.D.

## 2011-05-05 ENCOUNTER — Other Ambulatory Visit: Payer: Self-pay | Admitting: Internal Medicine

## 2011-06-14 ENCOUNTER — Encounter: Payer: Self-pay | Admitting: Internal Medicine

## 2011-06-15 ENCOUNTER — Ambulatory Visit (INDEPENDENT_AMBULATORY_CARE_PROVIDER_SITE_OTHER): Payer: BC Managed Care – PPO | Admitting: Internal Medicine

## 2011-06-15 ENCOUNTER — Encounter: Payer: Self-pay | Admitting: Internal Medicine

## 2011-06-15 DIAGNOSIS — E785 Hyperlipidemia, unspecified: Secondary | ICD-10-CM

## 2011-06-15 DIAGNOSIS — I10 Essential (primary) hypertension: Secondary | ICD-10-CM

## 2011-06-15 DIAGNOSIS — E119 Type 2 diabetes mellitus without complications: Secondary | ICD-10-CM

## 2011-06-15 LAB — BASIC METABOLIC PANEL
Calcium: 9.1 mg/dL (ref 8.4–10.5)
Creatinine, Ser: 0.7 mg/dL (ref 0.4–1.5)

## 2011-06-15 LAB — LDL CHOLESTEROL, DIRECT: Direct LDL: 58.4 mg/dL

## 2011-06-15 LAB — LIPID PANEL
Cholesterol: 111 mg/dL (ref 0–200)
Total CHOL/HDL Ratio: 3
Triglycerides: 227 mg/dL — ABNORMAL HIGH (ref 0.0–149.0)
VLDL: 45.4 mg/dL — ABNORMAL HIGH (ref 0.0–40.0)

## 2011-06-15 MED ORDER — METFORMIN HCL 500 MG PO TABS: 500.0000 mg | ORAL_TABLET | Freq: Two times a day (BID) | ORAL | Status: DC

## 2011-06-15 NOTE — Assessment & Plan Note (Signed)
Due for labs, good med compliance  

## 2011-06-15 NOTE — Progress Notes (Signed)
  Subjective:    Patient ID: [name], male    DOB: [birth year], 50 y.o.   MRN: [id number]  HPI Routine office visit Diabetes--good medication compliance, ambulatory but sugars 80-90. Hypertension-good medication compliance, blood pressure 1:30/80 High cholesterol--diet doing well, has cut down on eating out. He remains active and exercise regularly. Good medication compliance without apparent side effects  Past Medical History  Diagnosis Date  . Hyperlipidemia   . Hypertension   . Diabetes mellitus     No past surgical history on file.   Review of Systems No nausea, vomiting or diarrhea Vision is normal, report recent eye doctor visit. No chest pain or shortness of breath    Objective:   Physical Exam  Constitutional: He is oriented to person, place, and time. He appears well-developed and well-nourished. No distress.  HENT:  Head: Normocephalic and atraumatic.  Cardiovascular: Normal rate, regular rhythm and normal heart sounds.   No murmur heard. Pulmonary/Chest: Effort normal and breath sounds normal. No respiratory distress. He has no wheezes. He has no rales.  Musculoskeletal:       DIABETIC FEET EXAM: No lower extremity edema Normal pedal pulses bilaterally Skin and nails are normal without calluses (nails slt long, counseled) Pinprick examination of the feet normal.   Neurological: He is alert and oriented to person, place, and time.  Skin: He is not diaphoretic.  Psychiatric: He has a normal mood and affect. His behavior is normal. Judgment and thought content normal.          Assessment & Plan:

## 2011-06-15 NOTE — Assessment & Plan Note (Signed)
Doing well, labs

## 2011-06-15 NOTE — Assessment & Plan Note (Signed)
At goal, labs  

## 2011-06-19 ENCOUNTER — Telehealth: Payer: Self-pay | Admitting: *Deleted

## 2011-06-19 NOTE — Telephone Encounter (Signed)
Message left for patient to return my call.  

## 2011-06-19 NOTE — Telephone Encounter (Signed)
Message copied by Leanne Lovely on Tue Jun 19, 2011  3:36 PM ------      Message from: Willow Ora E      Created: Tue Jun 19, 2011  1:09 PM       Advise patient      Diabetes under excellent control      Cholesterol well controlled except the triglycerides that  are slightly elevated.      Recommend continue with same meds and continue watching his diet.

## 2011-06-20 NOTE — Telephone Encounter (Signed)
Pt is aware.  

## 2011-08-17 ENCOUNTER — Other Ambulatory Visit: Payer: Self-pay | Admitting: Internal Medicine

## 2011-08-17 NOTE — Telephone Encounter (Signed)
Rx[s] Done. 

## 2011-09-05 ENCOUNTER — Encounter: Payer: Self-pay | Admitting: Internal Medicine

## 2011-09-05 ENCOUNTER — Ambulatory Visit (INDEPENDENT_AMBULATORY_CARE_PROVIDER_SITE_OTHER): Payer: BC Managed Care – PPO | Admitting: Internal Medicine

## 2011-09-05 VITALS — BP 108/68 | HR 75 | Temp 98.7°F | Resp 14 | Wt 208.4 lb

## 2011-09-05 DIAGNOSIS — R21 Rash and other nonspecific skin eruption: Secondary | ICD-10-CM | POA: Insufficient documentation

## 2011-09-05 MED ORDER — HYDROCORTISONE 2.5 % EX CREA: TOPICAL_CREAM | Freq: Two times a day (BID) | CUTANEOUS | Status: DC

## 2011-09-05 MED ORDER — DOXYCYCLINE HYCLATE 100 MG PO TABS: 100.0000 mg | ORAL_TABLET | Freq: Two times a day (BID) | ORAL | Status: DC

## 2011-09-05 NOTE — Assessment & Plan Note (Addendum)
Staph infection?,  insect bite??, reaction to OTC cream? Plan: Doxy, hydrocortisone, see instructions

## 2011-09-05 NOTE — Progress Notes (Signed)
  Subjective:    Patient ID: [name], male    DOB: [birth year], 50 y.o.   MRN: [id number]  HPI Developed a red spot at the right pretibial area, area has expanded to some extent, is using Polysporin. Has not seen actually any spyders or insects biting him  Past Medical History  Diagnosis Date  . Hyperlipidemia   . Hypertension   . Diabetes mellitus    No past surgical history on file.  Review of Systems Denies fevers, no pain in the area. Has not seen any blisters in the leg. Rash is slightly itchy    Objective:   Physical Exam  Musculoskeletal: He exhibits no edema.       Legs:         Assessment & Plan:

## 2011-09-05 NOTE — Patient Instructions (Signed)
Use meds as prescribed x 1 week, call if fever, redness starts to expand, or no better in few days

## 2011-09-06 ENCOUNTER — Telehealth: Payer: Self-pay | Admitting: *Deleted

## 2011-09-06 ENCOUNTER — Other Ambulatory Visit: Payer: Self-pay | Admitting: *Deleted

## 2011-09-06 MED ORDER — HYDROCORTISONE 2.5 % EX CREA: TOPICAL_CREAM | Freq: Two times a day (BID) | CUTANEOUS | Status: DC

## 2011-09-06 MED ORDER — DOXYCYCLINE HYCLATE 100 MG PO TABS: 100.0000 mg | ORAL_TABLET | Freq: Two times a day (BID) | ORAL | Status: AC

## 2011-09-06 NOTE — Telephone Encounter (Signed)
Patient called on voicemail [twice] sounding greatly upset that his Rx[s] [ABX, cream] were not at his drugstore as promised at [facility] on 09/05/2011. Check EMR chart: prescriptions were sent to [facility] by MD; corrected error and informed with apology to patient that his Rx[s] were sent to local pharmacy and should be ready for P/U shortly.

## 2011-09-10 ENCOUNTER — Telehealth: Payer: Self-pay | Admitting: Internal Medicine

## 2011-09-10 NOTE — Telephone Encounter (Signed)
Noted  

## 2011-09-10 NOTE — Telephone Encounter (Signed)
Patient called last  Week said antibiotic & cream was sent to [facility] - per note rx was cancelled - patient picked rx up at local pharmacy & received rx from [facility] - just wanted to let dr Drue Novel know

## 2011-09-22 ENCOUNTER — Other Ambulatory Visit: Payer: Self-pay | Admitting: Internal Medicine

## 2011-09-24 NOTE — Telephone Encounter (Signed)
Done

## 2011-12-21 ENCOUNTER — Ambulatory Visit (INDEPENDENT_AMBULATORY_CARE_PROVIDER_SITE_OTHER): Payer: BC Managed Care – PPO | Admitting: Internal Medicine

## 2011-12-21 DIAGNOSIS — I10 Essential (primary) hypertension: Secondary | ICD-10-CM

## 2011-12-21 DIAGNOSIS — E785 Hyperlipidemia, unspecified: Secondary | ICD-10-CM

## 2011-12-21 DIAGNOSIS — E119 Type 2 diabetes mellitus without complications: Secondary | ICD-10-CM

## 2011-12-21 LAB — HEMOGLOBIN A1C: Hgb A1c MFr Bld: 6 % (ref 4.6–6.5)

## 2011-12-21 NOTE — Progress Notes (Signed)
  Subjective:    Patient ID: [name], male    DOB: March 04, 1961, 51 y.o.   MRN: [id number]  HPI Routine office visit Doing well, no complaints, good medication compliance. Ambulatory blood sugars less than 100, no symptoms of hypoglycemia No ambulatory BPs  Past Medical History: Hyperlipidemia Hypertension Diabetes mellitus, type II, Dx 2008 aprox  Past Surgical History: Denies surgical history  Family History: MI--no DM--F Prostate ca-- GF dx late 35s colon ca--no  Social History: Occupation:NAPA Single, no children Never Smoked Alcohol use-no Drug use-no diet is okay Regular exercise-- exercises @  [employer]   Review of Systems No chest or shortness of breath No tingling or numbness in his toes. No nausea, vomiting, diarrhea. He did have his flu shot    Objective:   Physical Exam  Constitutional: He appears well-developed and well-nourished.  Cardiovascular: Normal rate, regular rhythm and normal heart sounds.   No murmur heard. Pulmonary/Chest: Effort normal and breath sounds normal. No respiratory distress. He has no wheezes. He has no rales.  Musculoskeletal:       DIABETIC FEET EXAM: No lower extremity edema Normal pedal pulses bilaterally Skin normal without calluses, nails slightly long Pinprick examination of the feet normal.       Assessment & Plan:

## 2011-12-21 NOTE — Assessment & Plan Note (Signed)
No change 

## 2011-12-21 NOTE — Assessment & Plan Note (Addendum)
On Glucophage only, labs. Feet care discussed

## 2011-12-21 NOTE — Assessment & Plan Note (Signed)
Good medication compliance, cost of Niaspan is an issue. Recommend to use a OTC equivalent. We'll check his FLP and liver tests on return to the office

## 2011-12-22 ENCOUNTER — Encounter: Payer: Self-pay | Admitting: Internal Medicine

## 2012-02-13 ENCOUNTER — Other Ambulatory Visit: Payer: Self-pay | Admitting: Internal Medicine

## 2012-02-13 NOTE — Telephone Encounter (Signed)
Refill done.  

## 2012-02-22 ENCOUNTER — Other Ambulatory Visit: Payer: Self-pay | Admitting: Internal Medicine

## 2012-02-22 NOTE — Telephone Encounter (Signed)
Refill done.  

## 2012-03-02 ENCOUNTER — Other Ambulatory Visit: Payer: Self-pay | Admitting: Internal Medicine

## 2012-03-03 NOTE — Telephone Encounter (Signed)
Refill done.  

## 2012-03-27 ENCOUNTER — Encounter: Payer: Self-pay | Admitting: Gastroenterology

## 2012-04-21 ENCOUNTER — Ambulatory Visit (INDEPENDENT_AMBULATORY_CARE_PROVIDER_SITE_OTHER): Payer: BC Managed Care – PPO | Admitting: Internal Medicine

## 2012-04-21 ENCOUNTER — Encounter: Payer: Self-pay | Admitting: Internal Medicine

## 2012-04-21 DIAGNOSIS — E785 Hyperlipidemia, unspecified: Secondary | ICD-10-CM

## 2012-04-21 DIAGNOSIS — I10 Essential (primary) hypertension: Secondary | ICD-10-CM

## 2012-04-21 DIAGNOSIS — Z0000 Encounter for general adult medical examination without abnormal findings: Secondary | ICD-10-CM

## 2012-04-21 DIAGNOSIS — E119 Type 2 diabetes mellitus without complications: Secondary | ICD-10-CM

## 2012-04-21 DIAGNOSIS — E010 Iodine-deficiency related diffuse (endemic) goiter: Secondary | ICD-10-CM

## 2012-04-21 LAB — COMPREHENSIVE METABOLIC PANEL
Albumin: 4.3 g/dL (ref 3.5–5.2)
CO2: 22 mEq/L (ref 19–32)
GFR: 133.57 mL/min (ref >60.00)
Glucose, Bld: 99 mg/dL (ref 70–99)
Potassium: 3.6 mEq/L (ref 3.5–5.1)
Sodium: 143 mEq/L (ref 135–145)
Total Protein: 7.4 g/dL (ref 6.0–8.3)

## 2012-04-21 LAB — MICROALBUMIN / CREATININE URINE RATIO
Creatinine,U: 72 mg/dL
Microalb Creat Ratio: 0.4 mg/g (ref 0.0–30.0)

## 2012-04-21 LAB — CBC WITH DIFFERENTIAL/PLATELET
Eosinophils Relative: 3.1 % (ref 0.0–5.0)
Monocytes Absolute: 0.4 10*3/uL (ref 0.1–1.0)
Monocytes Relative: 5.1 % (ref 3.0–12.0)
Neutrophils Relative %: 66.4 % (ref 43.0–77.0)
Platelets: 157 10*3/uL (ref 150.0–400.0)
WBC: 8.1 10*3/uL (ref 4.5–10.5)

## 2012-04-21 LAB — PSA: PSA: 0.7 ng/mL (ref 0.10–4.00)

## 2012-04-21 LAB — HEMOGLOBIN A1C: Hgb A1c MFr Bld: 6.1 % (ref 4.6–6.5)

## 2012-04-21 NOTE — Assessment & Plan Note (Signed)
Td 2005 Pneumonia shot 2008 Diet and exercise discussed Patient got a letter from GI in reference to colon cancer screening, differences between colonoscopy and an iFOB  discussed.  iFOB provided, will call when interested in a colonoscopy. labs

## 2012-04-21 NOTE — Patient Instructions (Signed)
Please get your eyes checked at least yearly. Next office visit in 4-5 months.

## 2012-04-21 NOTE — Progress Notes (Signed)
  Subjective:    Patient ID: [name], male    DOB: Jul 05, 1961, 51 y.o.   MRN: [id number]  HPI CPX  Past Medical History:  Hyperlipidemia  Hypertension  Diabetes mellitus, type II, Dx 2008 aprox  Fatty liver, saw GI 2008  Past Surgical History:  Denies surgical history   Family History:  MI--no  Strokes--no DM--F  Prostate ca-- GF dx late 58s  colon ca--no   Social History:  Occupation:NAPA  Single, no children  Never Smoked  Alcohol use--no  Drug use-no  diet is okay  Regular exercise-- exercises @ [employer] ~ 3/week   Review of Systems Good compliance with all meds Compliance with medications, ambulatory  sugars run from 95-100. Ambulatory blood pressure usually 130/80, 130/85. No chest pain or shortness of breath, no edema No nausea, vomiting, diarrhea or blood in the stools. No dysuria or gross hematuria.     Objective:   Physical Exam General -- alert, well-developed, and overweight appearing.   Neck -- symmetrical , nontender, slightly enlarged thyroid ?Marland Kitchen Normal carotid pulse Lungs -- normal respiratory effort, no intercostal retractions, no accessory muscle use, and normal breath sounds.   Heart-- normal rate, regular rhythm, no murmur, and no gallop.   Abdomen--soft, non-tender, no distention, no masses, no HSM, no guarding, and no rigidity.   Extremities-- no pretibial edema bilaterally Rectal-- No external abnormalities noted. Normal sphincter tone. No rectal masses or tenderness. Brown stool, Hemoccult negative Prostate:  Prostate gland firm and smooth, no enlargement, nodularity, tenderness, mass, asymmetry or induration. Neurologic-- alert & oriented X3 and strength normal in all extremities. Psych-- Cognition and judgment appear intact. Alert and cooperative with normal attention span and concentration.  not anxious appearing and not depressed appearing.       Assessment & Plan:

## 2012-04-21 NOTE — Assessment & Plan Note (Signed)
Question of thyromegaly on exam, will order ultrasound

## 2012-04-21 NOTE — Assessment & Plan Note (Signed)
Seems to be well controlled, recommend eye exam yearly. Labs

## 2012-04-21 NOTE — Assessment & Plan Note (Signed)
Well controlled, not on ACE inhibitors, we'll check a microalbumin.

## 2012-04-21 NOTE — Assessment & Plan Note (Signed)
Due for labs

## 2012-04-23 LAB — LIPID PANEL
LDL Cholesterol: 41 mg/dL (ref 0–99)
VLDL: 32.4 mg/dL (ref 0.0–40.0)

## 2012-04-24 ENCOUNTER — Encounter: Payer: Self-pay | Admitting: *Deleted

## 2012-04-25 ENCOUNTER — Ambulatory Visit
Admission: RE | Admit: 2012-04-25 | Discharge: 2012-04-25 | Disposition: A | Discharge status: Home / Self Care | Payer: BC Managed Care – PPO | Source: Ambulatory Visit | Attending: Internal Medicine | Admitting: Internal Medicine

## 2012-04-25 DIAGNOSIS — E010 Iodine-deficiency related diffuse (endemic) goiter: Secondary | ICD-10-CM

## 2012-05-13 ENCOUNTER — Other Ambulatory Visit: Payer: BC Managed Care – PPO

## 2012-05-13 LAB — FECAL OCCULT BLOOD, IMMUNOCHEMICAL: Fecal Occult Bld: NEGATIVE

## 2012-07-19 ENCOUNTER — Other Ambulatory Visit: Payer: Self-pay | Admitting: Internal Medicine

## 2012-08-13 ENCOUNTER — Ambulatory Visit (INDEPENDENT_AMBULATORY_CARE_PROVIDER_SITE_OTHER): Payer: BC Managed Care – PPO | Admitting: Internal Medicine

## 2012-08-13 ENCOUNTER — Encounter: Payer: Self-pay | Admitting: Internal Medicine

## 2012-08-13 VITALS — BP 124/70 | HR 72 | Temp 98.6°F | Wt 211.2 lb

## 2012-08-13 DIAGNOSIS — L509 Urticaria, unspecified: Secondary | ICD-10-CM

## 2012-08-13 MED ORDER — HYDROXYZINE HCL 10 MG PO TABS: 10.0000 mg | ORAL_TABLET | Freq: Four times a day (QID) | ORAL | Status: AC | PRN

## 2012-08-13 MED ORDER — RANITIDINE HCL 150 MG PO TABS: 150.0000 mg | ORAL_TABLET | Freq: Two times a day (BID) | ORAL | Status: DC

## 2012-08-13 MED ORDER — PREDNISONE 20 MG PO TABS: 20.0000 mg | ORAL_TABLET | Freq: Two times a day (BID) | ORAL | Status: DC

## 2012-08-13 NOTE — Patient Instructions (Addendum)
Avoid all soaps & topical agents which are not hypoallergenic. Restrict hyperallergenic foods at this time: Nuts, strawberries, seafood , chocolate, and tomatoes.  Please see Dr. Drue Novel in one week

## 2012-08-13 NOTE — Progress Notes (Signed)
  Subjective:    Patient ID: [name], male    DOB: 1961/03/21, 51 y.o.   MRN: [id number]  HPI He developed pruritic rash over the forearm areas at the elbow 08/09/12 without any specific trigger or eliciting factor while eating lunch.He denies any pet , insect vector, tick or topical exposures.He denies ingestion of any hyperallergic foods such as nuts, strawberries, chocolate, shellfish, or tomatoes.  He has been  on niacin for 5-6 years, but this was changed to an over-the-counter supplemental form 5 months ago. He is on metformin; his A1c was 6.1% in May    Review of Systems He denies any associated itchy or watery eyes, sneezing, cough, wheezing, shortness of breath, or swelling of his lips or tongue.     Objective:   Physical Exam General appearance:adequately nourished; no acute distress or increased work of breathing is present.  No  lymphadenopathy about the head, neck, or axilla noted.   Eyes: No conjunctival inflammation or lid edema is present. There is no scleral icterus.  Ears:  External ear exam shows no significant lesions or deformities.  Otoscopic examination reveals clear canals, tympanic membranes are intact bilaterally without bulging, retraction, inflammation or discharge.  Nose:  External nasal examination shows no deformity or inflammation. Nasal mucosa are pink and moist without lesions or exudates. No septal dislocation or deviation.No obstruction to airflow.   Oral exam: Dental hygiene is good; lips and gums are healthy appearing.There is no oropharyngeal erythema or exudate noted. Oropharynx dry  Neck:  No deformities, thyromegaly, masses, or tenderness noted.    Heart:  Normal rate and regular rhythm. S1 and S2 normal without gallop, murmur, click, rub or other extra sounds.   Lungs:Chest clear to auscultation; no wheezes, rhonchi,rales ,or rubs present.No increased work of breathing.    Abdomen: Normal bowel sounds; no organomegaly or  masses  Extremities:  No cyanosis, edema, or clubbing  noted    Skin: Diffuse urticarial rash over the forearms ventrally, lower abdomen and inguinal area. Dramatic dermatographia present.          Assessment & Plan:  #1 urticarial rash; questionable etiology.  Plan: The niacin& the metformin will be held. As noted his A1c was at the nondiabetic range. See medications and orders

## 2012-08-19 ENCOUNTER — Ambulatory Visit (INDEPENDENT_AMBULATORY_CARE_PROVIDER_SITE_OTHER): Payer: BC Managed Care – PPO | Admitting: Internal Medicine

## 2012-08-19 ENCOUNTER — Encounter: Payer: Self-pay | Admitting: Internal Medicine

## 2012-08-19 VITALS — BP 128/86 | HR 82 | Temp 98.2°F | Wt 211.0 lb

## 2012-08-19 DIAGNOSIS — L509 Urticaria, unspecified: Secondary | ICD-10-CM | POA: Insufficient documentation

## 2012-08-19 NOTE — Progress Notes (Signed)
  Subjective:    Patient ID: [name], male    DOB: July 31, 1961, 51 y.o.   MRN: [id number]  HPI One-week followup, was recently seen with urticaria, chart reviewed:  was prescribed prednisone, Atarax and Zantac. Niacin and  metformin were held He feels much better. Rash is drying up.  Past Medical History:   Hyperlipidemia   Hypertension   Diabetes mellitus, type II, Dx 2008 aprox   Fatty liver, saw GI 2008  Past Surgical History:   Denies surgical history   Family History:   MI--no   Strokes--no DM--F   Prostate ca-- GF dx late 65s   colon ca--no   Social History:   Occupation:[employer]   Single, no children   Never Smoked   Alcohol use--no   Drug use-no     Review of Systems denies any lip swelling or tongue swelling. Ambulatory CBGs despite not taking metformin are within normal.  Tolerate Atarax well without much somnolence   Objective:   Physical Exam A, Ox3, no apparent distress Face: Symmetric, lips normal. Skin: Previous urticarial rash has not extend compared to last week, it is less red and seems to be drying up       Assessment & Plan:

## 2012-08-19 NOTE — Assessment & Plan Note (Addendum)
Urticarial rash improving, he is finishing prednisone today. We'll keep him on Zantac and Atarax for few days, okay to go back to niacin and  metformin. Will call if symptoms resurface.

## 2012-08-19 NOTE — Patient Instructions (Addendum)
Go back to niacin and metformin Take Atarax as needed for itching Takes Zantac twice a day for one more week, then stop. Call anytime if the rash resurface

## 2012-09-15 ENCOUNTER — Ambulatory Visit (INDEPENDENT_AMBULATORY_CARE_PROVIDER_SITE_OTHER): Payer: BC Managed Care – PPO | Admitting: Internal Medicine

## 2012-09-15 ENCOUNTER — Encounter: Payer: Self-pay | Admitting: Internal Medicine

## 2012-09-15 VITALS — BP 122/84 | HR 71 | Temp 98.1°F | Wt 211.0 lb

## 2012-09-15 DIAGNOSIS — E785 Hyperlipidemia, unspecified: Secondary | ICD-10-CM

## 2012-09-15 DIAGNOSIS — I10 Essential (primary) hypertension: Secondary | ICD-10-CM

## 2012-09-15 DIAGNOSIS — Z23 Encounter for immunization: Secondary | ICD-10-CM

## 2012-09-15 DIAGNOSIS — E119 Type 2 diabetes mellitus without complications: Secondary | ICD-10-CM

## 2012-09-15 DIAGNOSIS — L989 Disorder of the skin and subcutaneous tissue, unspecified: Secondary | ICD-10-CM

## 2012-09-15 DIAGNOSIS — E049 Nontoxic goiter, unspecified: Secondary | ICD-10-CM

## 2012-09-15 DIAGNOSIS — E010 Iodine-deficiency related diffuse (endemic) goiter: Secondary | ICD-10-CM

## 2012-09-15 LAB — ALT: ALT: 47 U/L (ref 0–53)

## 2012-09-15 LAB — HEMOGLOBIN A1C: Hgb A1c MFr Bld: 6.6 % — ABNORMAL HIGH (ref 4.6–6.5)

## 2012-09-15 NOTE — Progress Notes (Signed)
  Subjective:    Patient ID: [name], male    DOB: June 14, 1961, 51 y.o.   MRN: [id number]  HPI Routine office visit Diabetes, good medication compliance, ambulatory blood sugars are in the 90s. Hypertension, good medication compliance, ambulatory BPs around 110/70.  Past Medical History:   Hyperlipidemia   Hypertension   Diabetes mellitus, type II, Dx 2008 aprox   Fatty liver, saw GI 2008  Past Surgical History:   Denies surgical history   Family History:   MI--no   Strokes--no DM--F   Prostate ca-- GF dx late 72s   colon ca--no   Social History:   Occupation:[employer]   Single, no children   Never Smoked   Alcohol use--no   Drug use-no     Review of Systems Denies low sugar symptoms Last week, had some nausea and vomited once, no diarrhea, fever, chills or blood in the stools. Symptoms completely resolved Also  urticaria not long ago, symptoms completely resolve    Objective:   Physical Exam General -- alert, well-developed, and overweight appearing. No apparent distress.  Lungs -- normal respiratory effort, no intercostal retractions, no accessory muscle use, and normal breath sounds.   Heart-- normal rate, regular rhythm, no murmur, and no gallop.    Extremities-- no pretibial edema bilaterally  Psych-- Cognition and judgment appear intact. Alert and cooperative with normal attention span and concentration.  not anxious appearing and not depressed appearing.      Assessment & Plan:

## 2012-09-15 NOTE — Assessment & Plan Note (Addendum)
Ultrasound 04-2012 showed with few nodules, nonspecific. No further evaluation at this time

## 2012-09-15 NOTE — Assessment & Plan Note (Addendum)
Has few skin lesions in the face, sees Dr. Terri Piedra

## 2012-09-15 NOTE — Assessment & Plan Note (Addendum)
Seems to be well-controlled, check the A1c. LFTs were slightly elevated before,no ETOH, has taken Tylenol 4 times in the last 2 weeks. Recheck LFTs Reports eye exam last month approximately

## 2012-09-15 NOTE — Assessment & Plan Note (Signed)
Well-controlled at present, just the triglycerides are slightly elevated. Plan: Continue with same medications

## 2012-09-15 NOTE — Assessment & Plan Note (Signed)
Well-controlled, no change 

## 2012-09-17 ENCOUNTER — Encounter: Payer: Self-pay | Admitting: *Deleted

## 2012-10-27 ENCOUNTER — Telehealth: Payer: Self-pay

## 2012-10-27 MED ORDER — METFORMIN HCL 500 MG PO TABS: 500.0000 mg | ORAL_TABLET | Freq: Two times a day (BID) | ORAL | Status: DC

## 2012-10-27 NOTE — Telephone Encounter (Signed)
Called pt back, he is requesting a refill for metformin 500mg  [facility]  Refill done.

## 2012-10-27 NOTE — Telephone Encounter (Signed)
Pt called [facility] triage line requesting CB from [facility] not giving reason/ any info. PLz advise pt [phone]

## 2012-12-23 ENCOUNTER — Other Ambulatory Visit: Payer: Self-pay | Admitting: Internal Medicine

## 2012-12-23 NOTE — Telephone Encounter (Signed)
Refill done.  

## 2013-02-19 IMAGING — US US SOFT TISSUE HEAD/NECK
1 series · 14 of 25 positions shown · non-contrast
Comparison: None.

CLINICAL DATA: Thyromegaly.

THYROID ULTRASOUND
TECHNIQUE: Ultrasound examination of the thyroid gland and adjacent
soft tissues was performed.

[Series 1: us soft tissue head/neck · 0.10mm/px · 14 of 65 slices shown]
[im 1/65]
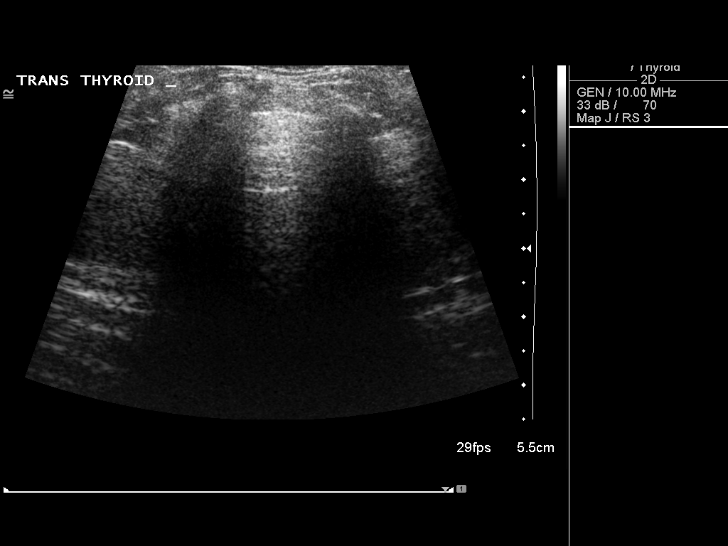
[im 6/65]
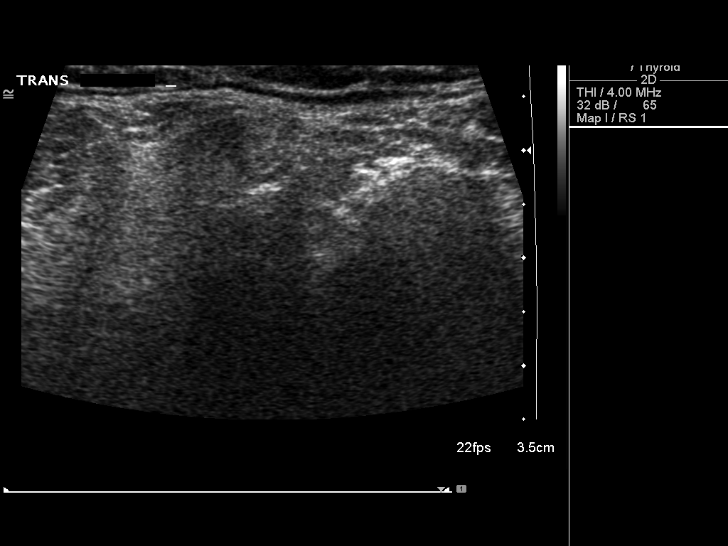
[im 11/65]
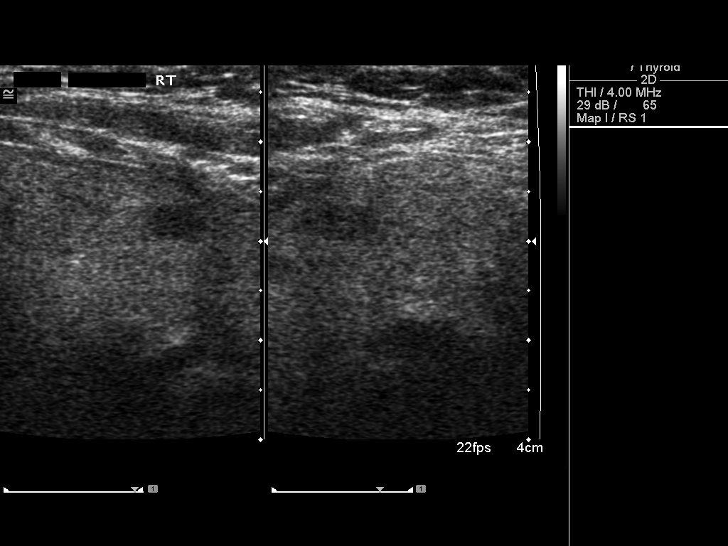
[im 17/65]
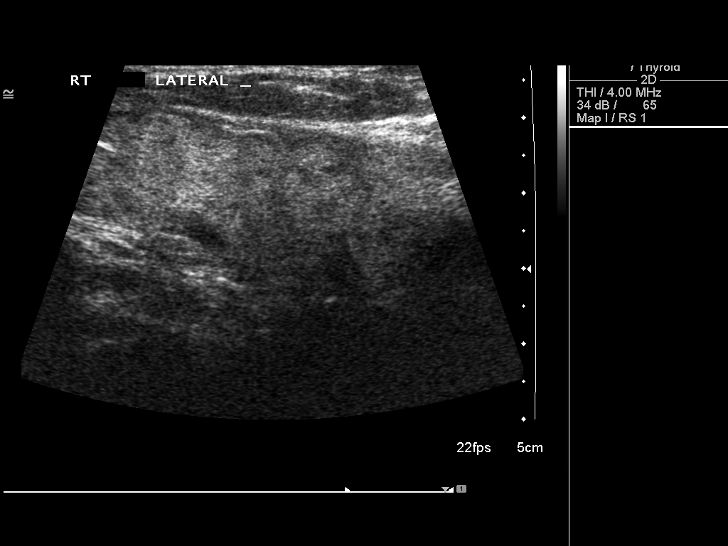
[im 22/65]
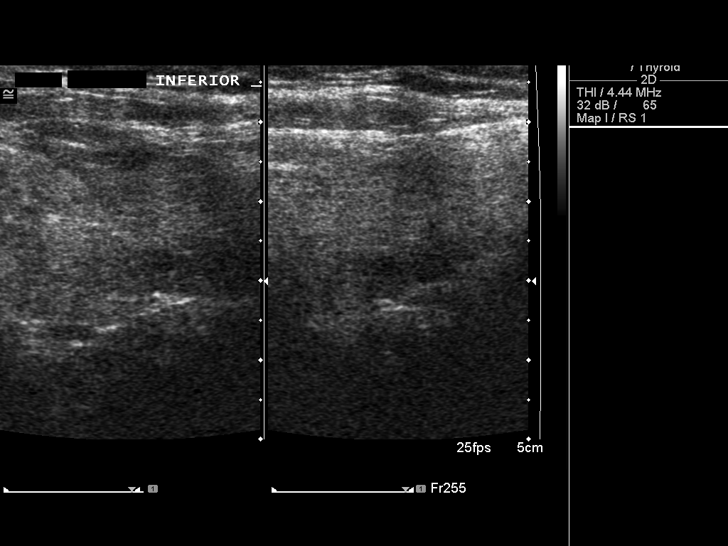
[im 25/65]
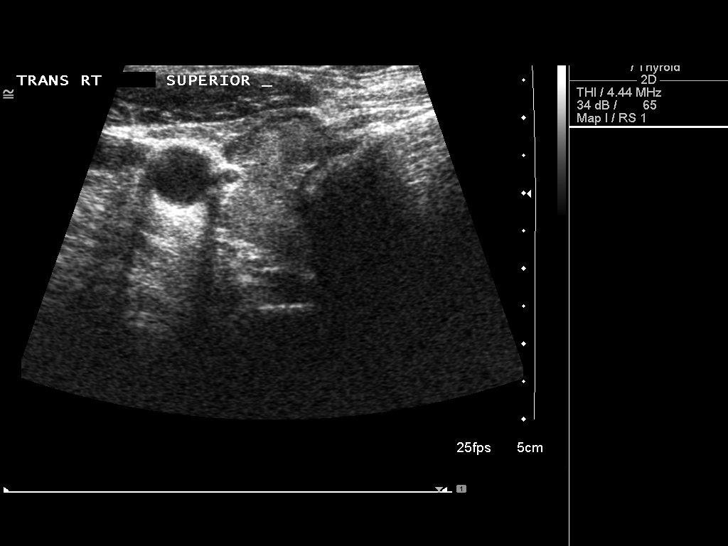
[im 30/65]
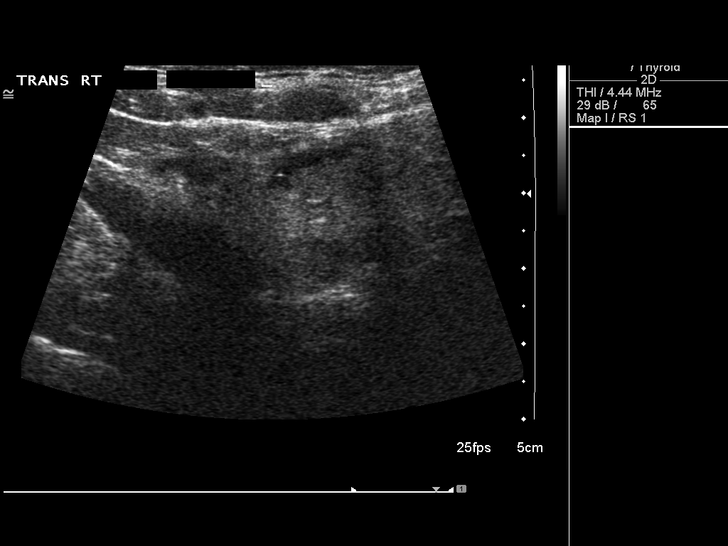
[im 35/65]
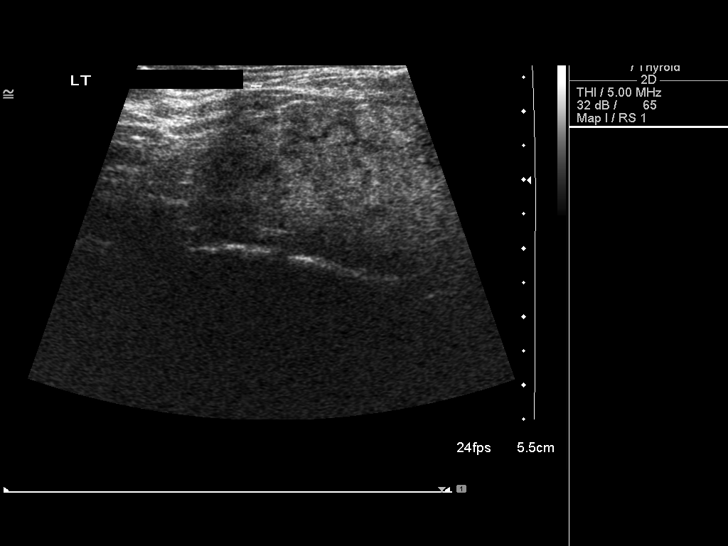
[im 41/65]
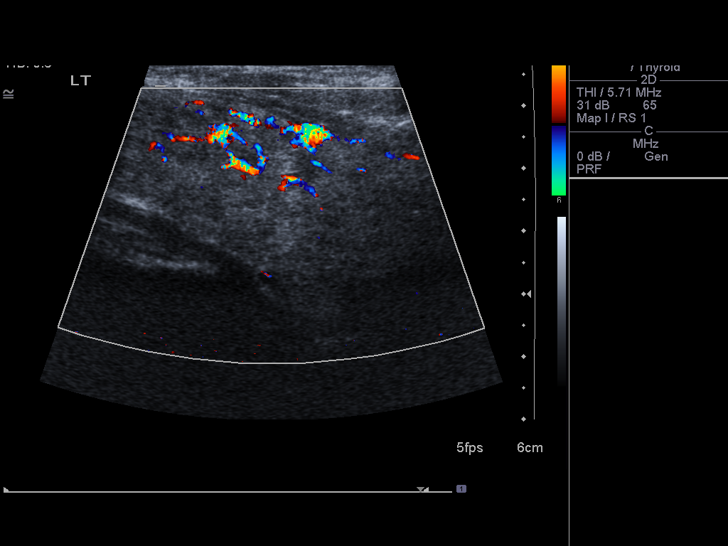
[im 43/65]
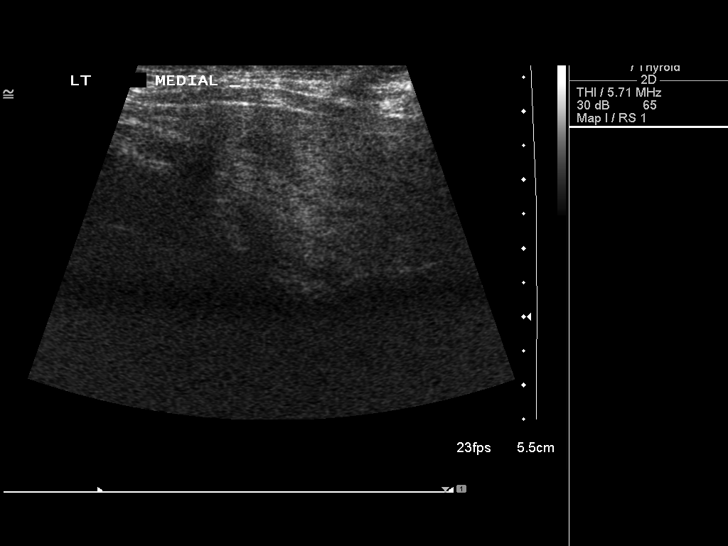
[im 49/65]
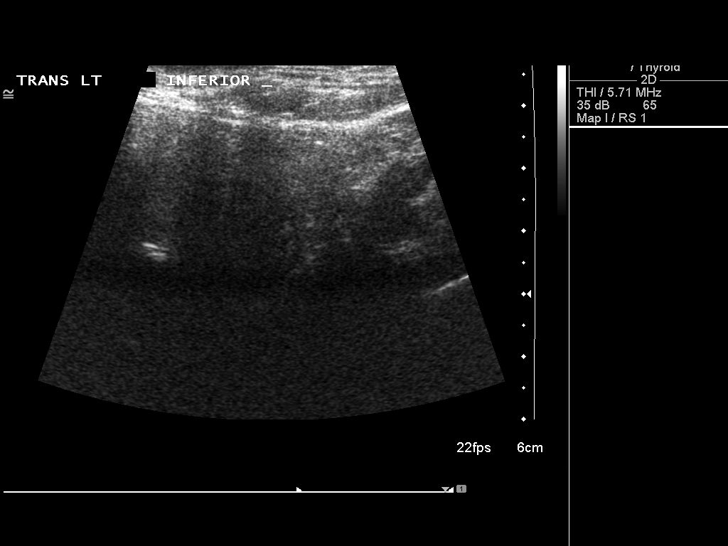
[im 54/65]
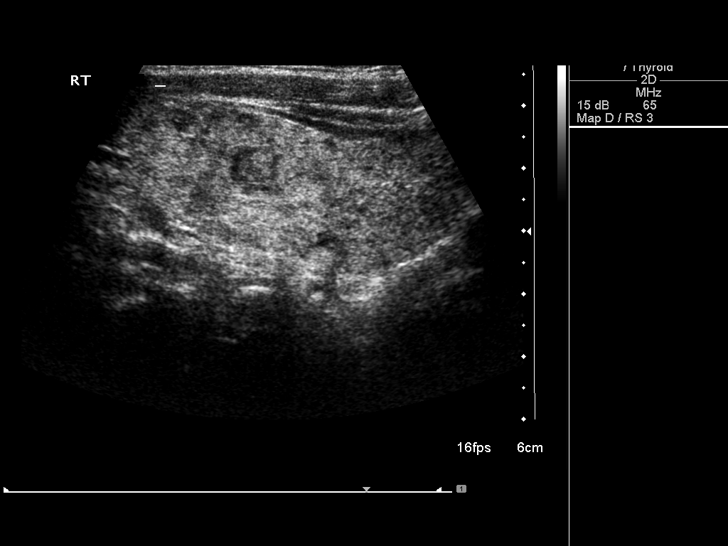
[im 59/65]
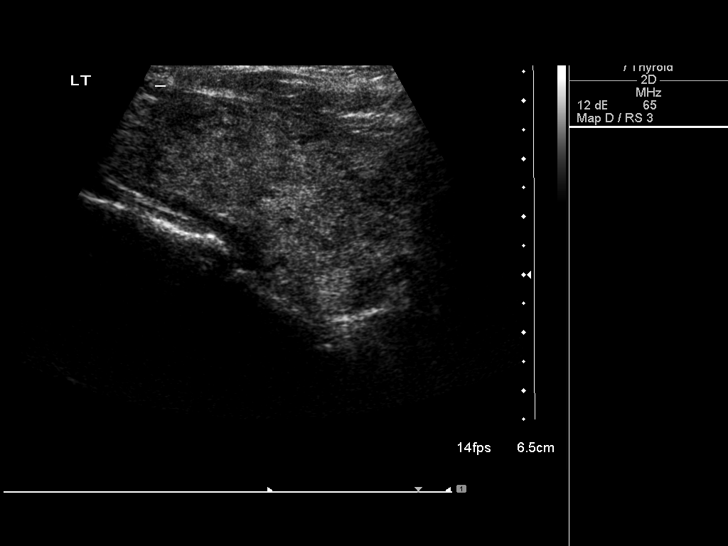
[im 65/65]
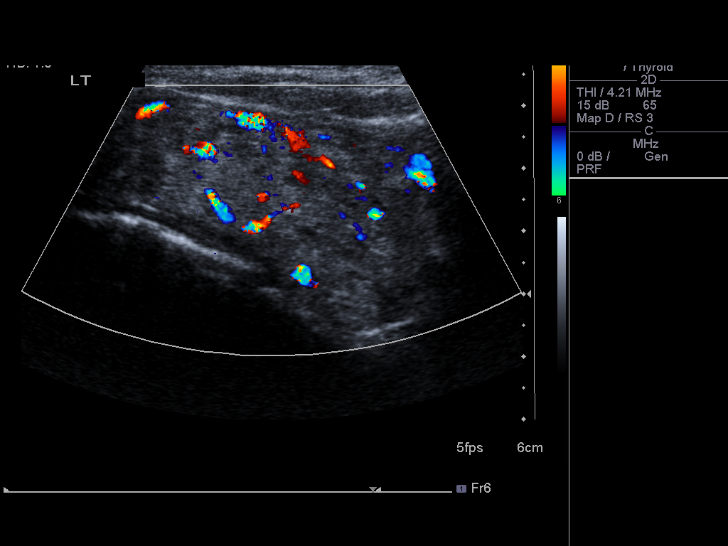

[14 of 25 positions shown; findings below may reference images not displayed]

FINDINGS: Right thyroid lobe:  7.1 x 2.4 x 2.7 cm.
Left thyroid lobe:  7.1 x 3.1 x 3.0 cm.
Isthmus:  1.4 cm.

Focal nodules:  Solid 1.0 x 0.9 x 1.1 cm nodule in the mid right
lobe.  Solid slightly hypoechoic 0.9 x 0.6 x 1.1 cm nodule in the
right lower pole.

Solid 0.6 x 0.4 x 0.7 cm nodule in the right side of the isthmus.

Possible subtle solid nodule in the posterior inferior aspect of
the left lobe measuring 1.5 x 1.1 x 1.5 cm.  This may not represent
a true nodule in the distant area of inhomogeneous parenchyma.

Lymphadenopathy:  None visualized.
IMPRESSION: The thyroid gland is slightly enlarged with multiple small solid
nodules consistent with a multi nodular goiter.  None fit criteria
for biopsy at this time.  The abnormality in the inferior aspect of
the left lobe is most likely parenchymal inhomogeneity rather than
a discrete nodule.

## 2013-02-25 ENCOUNTER — Other Ambulatory Visit: Payer: Self-pay | Admitting: Internal Medicine

## 2013-02-25 NOTE — Telephone Encounter (Signed)
Refill done.  

## 2013-03-17 ENCOUNTER — Ambulatory Visit (INDEPENDENT_AMBULATORY_CARE_PROVIDER_SITE_OTHER): Payer: BC Managed Care – PPO | Admitting: Internal Medicine

## 2013-03-17 ENCOUNTER — Encounter: Payer: Self-pay | Admitting: Internal Medicine

## 2013-03-17 VITALS — BP 118/82 | HR 72 | Temp 98.0°F | Wt 212.0 lb

## 2013-03-17 DIAGNOSIS — Z0000 Encounter for general adult medical examination without abnormal findings: Secondary | ICD-10-CM

## 2013-03-17 DIAGNOSIS — E785 Hyperlipidemia, unspecified: Secondary | ICD-10-CM

## 2013-03-17 DIAGNOSIS — E119 Type 2 diabetes mellitus without complications: Secondary | ICD-10-CM

## 2013-03-17 DIAGNOSIS — I10 Essential (primary) hypertension: Secondary | ICD-10-CM

## 2013-03-17 LAB — MICROALBUMIN / CREATININE URINE RATIO
Creatinine,U: 81 mg/dL
Microalb Creat Ratio: 0.9 mg/g (ref 0.0–30.0)

## 2013-03-17 LAB — AST: AST: 42 U/L — ABNORMAL HIGH (ref 0–37)

## 2013-03-17 LAB — BASIC METABOLIC PANEL
Calcium: 9 mg/dL (ref 8.4–10.5)
Creatinine, Ser: 0.8 mg/dL (ref 0.4–1.5)
GFR: 108.45 mL/min (ref >60.00)
Glucose, Bld: 132 mg/dL — ABNORMAL HIGH (ref 70–99)
Sodium: 139 mEq/L (ref 135–145)

## 2013-03-17 LAB — LIPID PANEL
Cholesterol: 127 mg/dL (ref 0–200)
LDL Cholesterol: 53 mg/dL (ref 0–99)

## 2013-03-17 LAB — HEMOGLOBIN A1C: Hgb A1c MFr Bld: 6.7 % — ABNORMAL HIGH (ref 4.6–6.5)

## 2013-03-17 MED ORDER — ATORVASTATIN CALCIUM 10 MG PO TABS: ORAL_TABLET | ORAL | Status: DC

## 2013-03-17 NOTE — Assessment & Plan Note (Signed)
Well controlled 

## 2013-03-17 NOTE — Assessment & Plan Note (Signed)
Seems well controlled, labs  Update on eye checks Feet exam normal today

## 2013-03-17 NOTE — Assessment & Plan Note (Signed)
Good med compliance, no apparent s/e, labs

## 2013-03-17 NOTE — Assessment & Plan Note (Signed)
We talked about a colonoscopy, he plans to call GI and set it up

## 2013-03-17 NOTE — Progress Notes (Signed)
  Subjective:    Patient ID: [name], male    DOB: [birth year], 52 y.o.   MRN: [id number]  HPI ROV DM-- good med compliance, he remains active, amb CBGs 90-95 w/o sx of hypoglycemia HTN-- good medication compliance, ambulatory BPs 128/85 Cholesterol-- take the Lipitor without any apparent side effects, needs a refill   Past Medical History  Diagnosis Date  . Hyperlipidemia   . Hypertension   . Diabetes mellitus 2008  . Fatty liver 2008    Fatty liver, saw GI     Past Surgical History  Procedure Laterality Date  . No past surgeries       Review of Systems No chest pain or shortness or breath. No lower extremity edema. No  nausea, vomiting, diarrhea Denies any blurred vision or lower extremity paresthesias    Objective:   Physical Exam General -- alert, well-developed, no apparent distress  Lungs -- normal respiratory effort, no intercostal retractions, no accessory muscle use, and normal breath sounds.   Heart-- normal rate, regular rhythm, no murmur, and no gallop.   DIABETIC FEET EXAM: No lower extremity edema Normal pedal pulses bilaterally Skin and nails are normal without calluses Pinprick examination of the feet normal. Neurologic-- alert & oriented X3 and strength normal in all extremities. Psych-- Cognition and judgment appear intact. Alert and cooperative with normal attention span and concentration.  not anxious appearing and not depressed appearing.      Assessment & Plan:

## 2013-03-19 ENCOUNTER — Encounter: Payer: Self-pay | Admitting: *Deleted

## 2013-03-20 ENCOUNTER — Encounter: Payer: Self-pay | Admitting: Gastroenterology

## 2013-04-02 ENCOUNTER — Other Ambulatory Visit: Payer: Self-pay | Admitting: Internal Medicine

## 2013-04-02 NOTE — Telephone Encounter (Signed)
Refill done.  

## 2013-05-15 ENCOUNTER — Encounter: Payer: Self-pay | Admitting: Gastroenterology

## 2013-07-16 ENCOUNTER — Encounter: Payer: Self-pay | Admitting: Gastroenterology

## 2013-07-16 ENCOUNTER — Ambulatory Visit (AMBULATORY_SURGERY_CENTER): Payer: BC Managed Care – PPO

## 2013-07-16 VITALS — Ht 67.0 in | Wt 210.0 lb

## 2013-07-16 DIAGNOSIS — Z1211 Encounter for screening for malignant neoplasm of colon: Secondary | ICD-10-CM

## 2013-07-16 MED ORDER — MOVIPREP 100 G PO SOLR: 1.0000 | Freq: Once | ORAL | Status: DC

## 2013-07-30 ENCOUNTER — Encounter: Payer: Self-pay | Admitting: Gastroenterology

## 2013-07-30 ENCOUNTER — Ambulatory Visit (AMBULATORY_SURGERY_CENTER): Payer: BC Managed Care – PPO | Admitting: Gastroenterology

## 2013-07-30 VITALS — BP 119/74 | HR 77 | Temp 98.2°F | Resp 24 | Ht 67.0 in | Wt 210.0 lb

## 2013-07-30 DIAGNOSIS — D126 Benign neoplasm of colon, unspecified: Secondary | ICD-10-CM

## 2013-07-30 DIAGNOSIS — Z1211 Encounter for screening for malignant neoplasm of colon: Secondary | ICD-10-CM

## 2013-07-30 MED ORDER — SODIUM CHLORIDE 0.9 % IV SOLN: 500.0000 mL | INTRAVENOUS | Status: DC

## 2013-07-30 NOTE — Patient Instructions (Addendum)

## 2013-07-30 NOTE — Op Note (Signed)
[facility] [address] [place], [address]   COLONOSCOPY PROCEDURE REPORT  PATIENT: [name], [name]  MR#: [id number] BIRTHDATE: 1961-02-21 , 52  yrs. old GENDER: Male ENDOSCOPIST: Meryl Dare, MD, [facility] REFERRED XB:JYNW Drue Novel, M.D. PROCEDURE DATE:  07/30/2013 PROCEDURE:   Colonoscopy with snare polypectomy First Screening Colonoscopy - Avg.  risk and is 50 yrs.  old or older Yes.  Prior Negative Screening - Now for repeat screening. N/A  History of Adenoma - Now for follow-up colonoscopy & has been > or = to 3 yrs.  N/A  Polyps Removed Today? Yes. ASA CLASS:   Class II INDICATIONS:average risk screening. MEDICATIONS: MAC sedation, administered by CRNA and propofol (Diprivan) 200mg  IV DESCRIPTION OF PROCEDURE:   After the risks benefits and alternatives of the procedure were thoroughly explained, informed consent was obtained.  A digital rectal exam revealed no abnormalities of the rectum.   The LB [id number]  endoscope was introduced through the anus and advanced to the cecum, which was identified by both the appendix and ileocecal valve. No adverse events experienced.   The quality of the prep was good, using MoviPrep  The instrument was then slowly withdrawn as the colon was fully examined.  COLON FINDINGS: A sessile polyp measuring 7 mm in size was found at the cecum.  A polypectomy was performed with a cold snare.  The resection was complete and the polyp tissue was completely retrieved.   The colon was otherwise normal.  There was no diverticulosis, inflammation, polyps or cancers unless previously stated.  Retroflexed views revealed small internal hemorrhoids. The time to cecum=1 minutes 28 seconds.  Withdrawal time=12 minutes 55 seconds.  The scope was withdrawn and the procedure completed.  COMPLICATIONS: There were no complications.  ENDOSCOPIC IMPRESSION: 1.   Sessile polyp measuring 7 mm at the cecum; polypectomy performed with a cold  snare 2.   Small internal hemorrhoids  RECOMMENDATIONS: 1.  Await pathology results 2.  Repeat colonoscopy in 5 years if polyp adenomatous; otherwise 10 years  eSigned:  Meryl Dare, MD, [facility] 07/30/2013 9:16 AM   :

## 2013-07-30 NOTE — Progress Notes (Signed)
Report to pacu rn, vss, bbs=clear 

## 2013-07-30 NOTE — Progress Notes (Signed)
Patient did not experience any of the following events: a burn prior to discharge; a fall within the facility; wrong site/side/patient/procedure/implant event; or a hospital transfer or hospital admission upon discharge from the facility. (G8907) Patient did not have preoperative order for IV antibiotic SSI prophylaxis. (G8918)  

## 2013-07-30 NOTE — Progress Notes (Signed)
Called to room to assist during endoscopic procedure.  Patient ID and intended procedure confirmed with present staff. Received instructions for my participation in the procedure from the performing physician.  

## 2013-07-31 ENCOUNTER — Telehealth: Payer: Self-pay | Admitting: *Deleted

## 2013-07-31 NOTE — Telephone Encounter (Signed)
  Follow up Call-  Call back number 07/30/2013  Post procedure Call Back phone  # [phone]  Permission to leave phone message No     Patient questions:  Do you have a fever, pain , or abdominal swelling? no Pain Score  0 *  Have you tolerated food without any problems? yes  Have you been able to return to your normal activities? yes  Do you have any questions about your discharge instructions: Diet   no Medications  no Follow up visit  no  Do you have questions or concerns about your Care? no  Actions: * If pain score is 4 or above: No action needed, pain <4.

## 2013-08-01 ENCOUNTER — Other Ambulatory Visit: Payer: Self-pay | Admitting: Internal Medicine

## 2013-08-03 ENCOUNTER — Other Ambulatory Visit: Payer: Self-pay | Admitting: *Deleted

## 2013-08-03 MED ORDER — ATENOLOL 100 MG PO TABS: ORAL_TABLET | ORAL | Status: DC

## 2013-08-03 NOTE — Telephone Encounter (Signed)
Rx was refilled for Atenolol 100mg . Ag cma

## 2013-08-04 ENCOUNTER — Encounter: Payer: Self-pay | Admitting: Gastroenterology

## 2013-09-16 ENCOUNTER — Telehealth: Payer: Self-pay

## 2013-09-16 NOTE — Telephone Encounter (Signed)
LM for CB with spouse  HM reviewed. Due as noted: Flu Vaccine

## 2013-09-16 NOTE — Telephone Encounter (Signed)
Medication List and allergies: done   Pharmacy updated, uses [facility] for [facility] day supply Pharmacy undated, uses [facility] [address] for local prescriptions  HM UTD: yes Immunizations due: admin flu vaccine upon arrival  A/P: HM due: n/a PSA: n/a Last: CCS: UTD Last: 07/2013  To Discuss with Provider: n/a

## 2013-09-17 ENCOUNTER — Ambulatory Visit (INDEPENDENT_AMBULATORY_CARE_PROVIDER_SITE_OTHER): Payer: BC Managed Care – PPO | Admitting: Internal Medicine

## 2013-09-17 ENCOUNTER — Encounter: Payer: Self-pay | Admitting: Internal Medicine

## 2013-09-17 VITALS — BP 125/80 | HR 64 | Temp 98.7°F | Ht 66.5 in | Wt 206.0 lb

## 2013-09-17 DIAGNOSIS — E119 Type 2 diabetes mellitus without complications: Secondary | ICD-10-CM

## 2013-09-17 DIAGNOSIS — Z0000 Encounter for general adult medical examination without abnormal findings: Secondary | ICD-10-CM

## 2013-09-17 DIAGNOSIS — Z23 Encounter for immunization: Secondary | ICD-10-CM

## 2013-09-17 LAB — COMPREHENSIVE METABOLIC PANEL
AST: 51 U/L — ABNORMAL HIGH (ref 0–37)
Alkaline Phosphatase: 60 U/L (ref 39–117)
BUN: 12 mg/dL (ref 6–23)
Calcium: 9.4 mg/dL (ref 8.4–10.5)
Creatinine, Ser: 0.7 mg/dL (ref 0.4–1.5)

## 2013-09-17 LAB — CBC WITH DIFFERENTIAL/PLATELET
Basophils Relative: 0.6 % (ref 0.0–3.0)
Eosinophils Absolute: 0.3 10*3/uL (ref 0.0–0.7)
Eosinophils Relative: 3.6 % (ref 0.0–5.0)
Hemoglobin: 14.8 g/dL (ref 13.0–17.0)
Lymphocytes Relative: 24.9 % (ref 12.0–46.0)
MCHC: 33.9 g/dL (ref 30.0–36.0)
MCV: 90.2 fl (ref 78.0–100.0)
Neutro Abs: 5.3 10*3/uL (ref 1.4–7.7)
RBC: 4.85 Mil/uL (ref 4.22–5.81)
WBC: 8.1 10*3/uL (ref 4.5–10.5)

## 2013-09-17 LAB — HEMOGLOBIN A1C: Hgb A1c MFr Bld: 6.7 % — ABNORMAL HIGH (ref 4.6–6.5)

## 2013-09-17 LAB — PSA: PSA: 0.83 ng/mL (ref 0.10–4.00)

## 2013-09-17 MED ORDER — ATORVASTATIN CALCIUM 10 MG PO TABS: ORAL_TABLET | ORAL | Status: DC

## 2013-09-17 NOTE — Assessment & Plan Note (Addendum)
Td 2005 zostavax-- discussed  Flu shot today Pneumonia shot 2008 Prostate exam ? Of  slightly enlarged right lobule, otherwise is completely normal. Recheck DRE yearly, if PSA elevated will need urology referral. Cscope 07-2013, + adenomatous polyps, next in 5 years Dr. Russella Dar Diet and exercise discussed labs  Chronic medical problems, ambulatory to sugars around 90, ambulatory BPs around 120/80, seem well controlled, continue same meds, labs.

## 2013-09-17 NOTE — Assessment & Plan Note (Signed)
Not on ACE inhibitors, microalbumin has been consistently normal, BP well controlled with beta blockers. No change

## 2013-09-17 NOTE — Progress Notes (Signed)
  Subjective:    Patient ID: [name], male    DOB: 05/02/1961, 52 y.o.   MRN: [id number]  HPI CPX   Past Medical History  Diagnosis Date  . Hyperlipidemia   . Hypertension   . Diabetes mellitus 2008  . Fatty liver 2008    Fatty liver, saw GI     Past Surgical History  Procedure Laterality Date  . No past surgeries     History   Social History  . Marital Status: Single    Spouse Name: N/A    Number of Children: 0  . Years of Education: N/A   Occupational History  . [employer]   .     Social History Main Topics  . Smoking status: Never Smoker   . Smokeless tobacco: Never Used  . Alcohol Use: No  . Drug Use: No  . Sexual Activity: Not on file   Other Topics Concern  . Not on file   Social History Narrative                Family History  Problem Relation Age of Onset  . Heart attack Neg Hx   . Diabetes Father   . Prostate cancer Other     GF dx late 24s  . Colon cancer Neg Hx     Review of Systems Diet-- most of the time Exercise-- goes to the gyn ~ 3/week No  CP, SOB, lower extremity edema Denies  nausea, vomiting diarrhea Denies  blood in the stools (-) cough, sputum production No dysuria, gross hematuria, difficulty urinating   No anxiety, depression     Objective:   Physical Exam BP 125/80  Pulse 64  Temp(Src) 98.7 F (37.1 C)  Ht 5' 6.5" (1.689 m)  Wt 206 lb (93.441 kg)  BMI 32.76 kg/m2  SpO2 99% General -- alert, well-developed, NAD.  Neck --no thyromegaly , normal carotid pulse Lungs -- normal respiratory effort, no intercostal retractions, no accessory muscle use, and normal breath sounds.  Heart-- normal rate, regular rhythm, no murmur.  Abdomen-- Not distended, good bowel sounds,soft, non-tender. Rectal-- No external abnormalities noted. Normal sphincter tone. No rectal masses or tenderness. Brown stool  Prostate-- Prostate gland firm and smooth, no enlargement, nodularity, tenderness, mass,  or induration. R lobule slt  larger? Is otherwise normal Extremities-- no pretibial edema bilaterally  Neurologic--  alert & oriented X3. Speech normal, gait normal, strength normal in all extremities.  Psych-- Cognition and judgment appear intact. Cooperative with normal attention span and concentration. No anxious appearing , no depressed appearing.      Assessment & Plan:

## 2013-09-20 ENCOUNTER — Encounter: Payer: Self-pay | Admitting: Internal Medicine

## 2013-09-20 DIAGNOSIS — R945 Abnormal results of liver function studies: Secondary | ICD-10-CM

## 2013-09-21 ENCOUNTER — Encounter: Payer: Self-pay | Admitting: *Deleted

## 2013-09-21 NOTE — Progress Notes (Signed)
Letter mailed to patient.

## 2013-09-26 ENCOUNTER — Other Ambulatory Visit: Payer: Self-pay | Admitting: Internal Medicine

## 2013-09-28 ENCOUNTER — Other Ambulatory Visit: Payer: Self-pay | Admitting: *Deleted

## 2013-09-28 MED ORDER — METFORMIN HCL 500 MG PO TABS: ORAL_TABLET | ORAL | Status: DC

## 2013-09-28 NOTE — Telephone Encounter (Signed)
Metformin refill sent to pharmacy

## 2014-01-28 ENCOUNTER — Other Ambulatory Visit: Payer: Self-pay | Admitting: Internal Medicine

## 2014-03-18 ENCOUNTER — Ambulatory Visit (INDEPENDENT_AMBULATORY_CARE_PROVIDER_SITE_OTHER): Payer: BC Managed Care – PPO | Admitting: Internal Medicine

## 2014-03-18 ENCOUNTER — Encounter: Payer: Self-pay | Admitting: Internal Medicine

## 2014-03-18 VITALS — BP 134/85 | HR 66 | Temp 97.9°F | Wt 209.0 lb

## 2014-03-18 DIAGNOSIS — E785 Hyperlipidemia, unspecified: Secondary | ICD-10-CM

## 2014-03-18 DIAGNOSIS — I10 Essential (primary) hypertension: Secondary | ICD-10-CM

## 2014-03-18 DIAGNOSIS — E010 Iodine-deficiency related diffuse (endemic) goiter: Secondary | ICD-10-CM

## 2014-03-18 DIAGNOSIS — Z23 Encounter for immunization: Secondary | ICD-10-CM

## 2014-03-18 DIAGNOSIS — E049 Nontoxic goiter, unspecified: Secondary | ICD-10-CM

## 2014-03-18 DIAGNOSIS — E119 Type 2 diabetes mellitus without complications: Secondary | ICD-10-CM

## 2014-03-18 LAB — TSH: TSH: 1.57 u[IU]/mL (ref 0.35–5.50)

## 2014-03-18 LAB — HEMOGLOBIN A1C: HEMOGLOBIN A1C: 6.7 % — AB (ref 4.6–6.5)

## 2014-03-18 LAB — LIPID PANEL
CHOL/HDL RATIO: 3
Cholesterol: 122 mg/dL (ref 0–200)
HDL: 38.8 mg/dL — ABNORMAL LOW (ref >39.00)
LDL Cholesterol: 48 mg/dL (ref 0–99)
TRIGLYCERIDES: 177 mg/dL — AB (ref 0.0–149.0)
VLDL: 35.4 mg/dL (ref 0.0–40.0)

## 2014-03-18 LAB — AST: AST: 77 U/L — ABNORMAL HIGH (ref 0–37)

## 2014-03-18 LAB — ALT: ALT: 83 U/L — AB (ref 0–53)

## 2014-03-18 NOTE — Assessment & Plan Note (Addendum)
Feet exam negative today. Next eye appointment in few hormones  Exercise goal--- 3 hours a week Labs

## 2014-03-18 NOTE — Progress Notes (Signed)
   Subjective:    Patient ID: [name], male    DOB: 1961/05/04, 53 y.o.   MRN: [id number]  DOS:  03/18/2014 Type of  Visit: Routine office visit Has a history of diabetes, high blood pressure, high cholesterol. Good compliance with medication. Ambulatory BPs usually 120/80. Has also thyromegaly, denies any pain or swelling @ neck   ROS Denies chest pain, difficulty breathing or lower extremity edema. Diet has improved a little, trying to stay more active doing a treadmill , lifting some weights    Past Medical History  Diagnosis Date  . Hyperlipidemia   . Hypertension   . Diabetes mellitus 2008  . Fatty liver 2008    Fatty liver, saw GI    . Elevated LFTs 09/20/2013    Past Surgical History  Procedure Laterality Date  . No past surgeries      History   Social History  . Marital Status: Single    Spouse Name: N/A    Number of Children: 0  . Years of Education: N/A   Occupational History  . [employer]   .     Social History Main Topics  . Smoking status: Never Smoker   . Smokeless tobacco: Never Used  . Alcohol Use: No  . Drug Use: No  . Sexual Activity: Not on file   Other Topics Concern  . Not on file   Social History Narrative                     Medication List       This list is accurate as of: 03/18/14  2:38 PM.  Always use your most recent med list.               aspirin 325 MG tablet  Take 325 mg by mouth daily.     atenolol 100 MG tablet  Commonly known as:  TENORMIN  TAKE 1 TABLET DAILY     atorvastatin 10 MG tablet  Commonly known as:  LIPITOR  TAKE 1 TABLET AT BEDTIME     fish oil-omega-3 fatty acids 1000 MG capsule  Take 4 g by mouth daily.     metFORMIN 500 MG tablet  Commonly known as:  GLUCOPHAGE  TAKE 1 TABLET TWICE A DAY WITH MEALS     multivitamin-iron-minerals-folic acid chewable tablet  Chew 1 tablet by mouth daily.     Niacin CR 1000 MG Tbcr  Take 1 tablet by mouth daily.           Objective:   Physical Exam BP 134/85  Pulse 66  Temp(Src) 97.9 F (36.6 C)  Wt 209 lb (94.802 kg)  SpO2 94%  General -- alert, well-developed, NAD.  Neck --barely palpable Thyroid, no tender. HEENT-- Not pale.   Lungs -- normal respiratory effort, no intercostal retractions, no accessory muscle use, and normal breath sounds.  Heart-- normal rate, regular rhythm, no murmur.  DIABETIC FEET EXAM: No lower extremity edema Normal pedal pulses bilaterally Skin normal, nails normal, no calluses Pinprick examination of the feet normal. Neurologic--  alert & oriented X3. Speech normal, gait normal, strength normal in all extremities.  Psych-- Cognition and judgment appear intact. Cooperative with normal attention span and concentration. No anxious or depressed appearing.      Assessment & Plan:

## 2014-03-18 NOTE — Assessment & Plan Note (Signed)
Controlled, no change

## 2014-03-18 NOTE — Assessment & Plan Note (Signed)
Physical exam stable today

## 2014-03-18 NOTE — Progress Notes (Signed)
Pre visit review using our clinic review tool, if applicable. No additional management support is needed unless otherwise documented below in the visit note. 

## 2014-03-18 NOTE — Patient Instructions (Signed)
Get your blood work before you leave   Next visit is for a physical exam in 6 months , fasting Please make an appointment    

## 2014-03-18 NOTE — Assessment & Plan Note (Signed)
Good med compliance , labs  

## 2014-03-19 ENCOUNTER — Telehealth: Payer: Self-pay | Admitting: Internal Medicine

## 2014-03-19 ENCOUNTER — Other Ambulatory Visit: Payer: Self-pay | Admitting: Internal Medicine

## 2014-03-19 NOTE — Telephone Encounter (Signed)
Relevant patient education mailed to patient.  

## 2014-03-25 ENCOUNTER — Encounter: Payer: Self-pay | Admitting: Internal Medicine

## 2014-03-30 ENCOUNTER — Telehealth: Payer: Self-pay

## 2014-03-30 NOTE — Telephone Encounter (Signed)
Relevant patient education assigned to patient using Emmi. ° °

## 2014-04-16 ENCOUNTER — Other Ambulatory Visit (INDEPENDENT_AMBULATORY_CARE_PROVIDER_SITE_OTHER): Payer: BC Managed Care – PPO

## 2014-04-16 DIAGNOSIS — R748 Abnormal levels of other serum enzymes: Secondary | ICD-10-CM

## 2014-04-16 LAB — HEPATIC FUNCTION PANEL
ALT: 72 U/L — ABNORMAL HIGH (ref 0–53)
AST: 67 U/L — ABNORMAL HIGH (ref 0–37)
Albumin: 4.1 g/dL (ref 3.5–5.2)
Alkaline Phosphatase: 52 U/L (ref 39–117)
Bilirubin, Direct: 0 mg/dL (ref 0.0–0.3)
Total Bilirubin: 0.9 mg/dL (ref 0.2–1.2)
Total Protein: 6.8 g/dL (ref 6.0–8.3)

## 2014-07-04 ENCOUNTER — Other Ambulatory Visit: Payer: Self-pay | Admitting: Internal Medicine

## 2014-08-02 ENCOUNTER — Encounter: Payer: Self-pay | Admitting: Internal Medicine

## 2014-08-23 ENCOUNTER — Other Ambulatory Visit: Payer: Self-pay | Admitting: Internal Medicine

## 2014-09-02 ENCOUNTER — Encounter: Payer: Self-pay | Admitting: Internal Medicine

## 2014-09-03 ENCOUNTER — Telehealth: Payer: Self-pay

## 2014-09-03 NOTE — Telephone Encounter (Signed)
Tried calling Pt to inform him that his paperwork is ready for pickup and placed at front desk. Home phone was busy; MyChart message sent to Pt to inform him paperwork is ready for pick up.

## 2014-09-20 ENCOUNTER — Encounter: Payer: Self-pay | Admitting: Internal Medicine

## 2014-09-20 ENCOUNTER — Ambulatory Visit (INDEPENDENT_AMBULATORY_CARE_PROVIDER_SITE_OTHER): Payer: BC Managed Care – PPO | Admitting: Internal Medicine

## 2014-09-20 VITALS — BP 149/91 | HR 70 | Temp 98.4°F | Ht 66.0 in | Wt 196.0 lb

## 2014-09-20 DIAGNOSIS — E119 Type 2 diabetes mellitus without complications: Secondary | ICD-10-CM

## 2014-09-20 DIAGNOSIS — Z0000 Encounter for general adult medical examination without abnormal findings: Secondary | ICD-10-CM

## 2014-09-20 DIAGNOSIS — I10 Essential (primary) hypertension: Secondary | ICD-10-CM

## 2014-09-20 DIAGNOSIS — Z23 Encounter for immunization: Secondary | ICD-10-CM

## 2014-09-20 LAB — CBC WITH DIFFERENTIAL/PLATELET
BASOS ABS: 0 10*3/uL (ref 0.0–0.1)
Basophils Relative: 0.4 % (ref 0.0–3.0)
Eosinophils Absolute: 0.3 10*3/uL (ref 0.0–0.7)
Eosinophils Relative: 2.8 % (ref 0.0–5.0)
HCT: 47.4 % (ref 39.0–52.0)
Hemoglobin: 15.8 g/dL (ref 13.0–17.0)
LYMPHS PCT: 20.3 % (ref 12.0–46.0)
Lymphs Abs: 1.9 10*3/uL (ref 0.7–4.0)
MCHC: 33.3 g/dL (ref 30.0–36.0)
MCV: 91.6 fl (ref 78.0–100.0)
Monocytes Absolute: 0.5 10*3/uL (ref 0.1–1.0)
Monocytes Relative: 4.8 % (ref 3.0–12.0)
NEUTROS PCT: 71.7 % (ref 43.0–77.0)
Neutro Abs: 6.8 10*3/uL (ref 1.4–7.7)
Platelets: 201 10*3/uL (ref 150.0–400.0)
RBC: 5.17 Mil/uL (ref 4.22–5.81)
RDW: 14.1 % (ref 11.5–15.5)
WBC: 9.4 10*3/uL (ref 4.0–10.5)

## 2014-09-20 LAB — HEMOGLOBIN A1C: HEMOGLOBIN A1C: 5.8 % (ref 4.6–6.5)

## 2014-09-20 LAB — MICROALBUMIN / CREATININE URINE RATIO
CREATININE, U: 17.4 mg/dL
MICROALB UR: 0.1 mg/dL (ref 0.0–1.9)
Microalb Creat Ratio: 0.6 mg/g (ref 0.0–30.0)

## 2014-09-20 LAB — BASIC METABOLIC PANEL
BUN: 8 mg/dL (ref 6–23)
CHLORIDE: 103 meq/L (ref 96–112)
CO2: 27 meq/L (ref 19–32)
Calcium: 9.9 mg/dL (ref 8.4–10.5)
Creatinine, Ser: 0.7 mg/dL (ref 0.4–1.5)
GFR: 132.29 mL/min (ref >60.00)
Glucose, Bld: 118 mg/dL — ABNORMAL HIGH (ref 70–99)
POTASSIUM: 3.9 meq/L (ref 3.5–5.1)
Sodium: 139 mEq/L (ref 135–145)

## 2014-09-20 LAB — LIPID PANEL
CHOL/HDL RATIO: 3
CHOLESTEROL: 130 mg/dL (ref 0–200)
HDL: 39.5 mg/dL (ref >39.00)
LDL Cholesterol: 59 mg/dL (ref 0–99)
NonHDL: 90.5
TRIGLYCERIDES: 158 mg/dL — AB (ref 0.0–149.0)
VLDL: 31.6 mg/dL (ref 0.0–40.0)

## 2014-09-20 LAB — PSA: PSA: 0.93 ng/mL (ref 0.10–4.00)

## 2014-09-20 NOTE — Progress Notes (Signed)
Pre visit review using our clinic review tool, if applicable. No additional management support is needed unless otherwise documented below in the visit note. 

## 2014-09-20 NOTE — Patient Instructions (Signed)
Get your blood work before you leave   Please come back to the office in 6 months  for a routine check up , no  fasting

## 2014-09-20 NOTE — Progress Notes (Signed)
Subjective:    Patient ID: [name], male    DOB: [birth year], 53 y.o.   MRN: [id number]  DOS:  09/20/2014 Type of visit - description : CPX   ROS DM---   (-) LE paresthesias , (-) visual disturbances  No  CP, SOB Denies  nausea, vomiting diarrhea, blood in the stools (-) cough, sputum production (-) wheezing, chest congestion No dysuria, gross hematuria, difficulty urinating  No anxiety,lost mom recently, dealing ok w/ the issue  No headaches.Denies dizziness      Past Medical History  Diagnosis Date  . Hyperlipidemia   . Hypertension   . Diabetes mellitus 2008  . Fatty liver 2008    Fatty liver, saw GI    . Elevated LFTs 09/20/2013    Past Surgical History  Procedure Laterality Date  . No past surgeries      History   Social History  . Marital Status: Single    Spouse Name: N/A    Number of Children: 0  . Years of Education: N/A   Occupational History  . [employer]   .     Social History Main Topics  . Smoking status: Never Smoker   . Smokeless tobacco: Never Used  . Alcohol Use: No  . Drug Use: No  . Sexual Activity: Not on file   Other Topics Concern  . Not on file   Social History Narrative                  Family History  Problem Relation Age of Onset  . Heart attack Neg Hx   . Diabetes Father   . Prostate cancer Other     GF dx late 64s  . Colon cancer Neg Hx        Medication List       This list is accurate as of: 09/20/14  8:06 AM.  Always use your most recent med list.               aspirin 325 MG tablet  Take 325 mg by mouth daily.     atenolol 100 MG tablet  Commonly known as:  TENORMIN  TAKE 1 TABLET DAILY     atorvastatin 10 MG tablet  Commonly known as:  LIPITOR  TAKE 1 TABLET AT BEDTIME     fish oil-omega-3 fatty acids 1000 MG capsule  Take 4 g by mouth daily.     metFORMIN 500 MG tablet  Commonly known as:  GLUCOPHAGE  TAKE 1 TABLET TWICE A DAY WITH MEALS     multivitamin-iron-minerals-folic  acid chewable tablet  Chew 1 tablet by mouth daily.     Niacin CR 1000 MG Tbcr  Take 1 tablet by mouth daily.           Objective:   Physical Exam BP 149/91  Pulse 70  Temp(Src) 98.4 F (36.9 C) (Oral)  Ht 5\' 6"  (1.676 m)  Wt 196 lb (88.905 kg)  BMI 31.65 kg/m2  SpO2 94%  General -- alert, well-developed, NAD.  Neck --no thyromegaly  HEENT-- Not pale.  Lungs -- normal respiratory effort, no intercostal retractions, no accessory muscle use, and normal breath sounds.  Heart-- normal rate, regular rhythm, no murmur.  Abdomen-- Not distended, good bowel sounds,soft, non-tender. No bruit. Rectal-- No external abnormalities noted. Normal sphincter tone. No rectal masses or tenderness.no stools  Prostate--Prostate gland firm and smooth, no enlargement, nodularity, tenderness, mass, asymmetry or induration. Extremities-- no pretibial edema bilaterally  Neurologic--  alert & oriented X3. Speech normal, gait appropriate for age, strength symmetric and appropriate for age.  Psych-- Cognition and judgment appear intact. Cooperative with normal attention span and concentration. No anxious or depressed appearing.       Assessment & Plan:

## 2014-09-20 NOTE — Assessment & Plan Note (Signed)
Td 2015 zostavax-- discussed  Flu shot today Pneumonia shot 2008 prevnar today Prostate exam normal today, check a PSA Cscope 07-2013, + adenomatous polyps, next in 5 years Dr. Fuller Plan Diet and exercise discussed labs

## 2014-09-20 NOTE — Assessment & Plan Note (Addendum)
Recent eye check negative per pt , check an A1c

## 2014-09-20 NOTE — Assessment & Plan Note (Signed)
BP slightly elevated today, at home usually 120/80, no change

## 2014-10-05 ENCOUNTER — Other Ambulatory Visit: Payer: Self-pay | Admitting: Physician Assistant

## 2014-10-05 ENCOUNTER — Other Ambulatory Visit: Payer: Self-pay

## 2014-10-05 MED ORDER — ATENOLOL 100 MG PO TABS: ORAL_TABLET | ORAL | Status: DC

## 2014-11-21 ENCOUNTER — Other Ambulatory Visit: Payer: Self-pay | Admitting: Internal Medicine

## 2015-03-11 ENCOUNTER — Other Ambulatory Visit: Payer: Self-pay | Admitting: Internal Medicine

## 2015-03-22 ENCOUNTER — Ambulatory Visit: Payer: BC Managed Care – PPO | Admitting: Internal Medicine

## 2015-03-28 ENCOUNTER — Ambulatory Visit: Payer: Self-pay | Admitting: Family Medicine

## 2015-03-31 ENCOUNTER — Encounter: Payer: Self-pay | Admitting: Family Medicine

## 2015-03-31 ENCOUNTER — Ambulatory Visit (INDEPENDENT_AMBULATORY_CARE_PROVIDER_SITE_OTHER): Payer: BLUE CROSS/BLUE SHIELD | Admitting: Family Medicine

## 2015-03-31 ENCOUNTER — Ambulatory Visit: Payer: Self-pay | Admitting: Family Medicine

## 2015-03-31 VITALS — BP 136/84 | HR 74 | Temp 98.8°F | Resp 16 | Ht 65.0 in | Wt 199.6 lb

## 2015-03-31 DIAGNOSIS — I10 Essential (primary) hypertension: Secondary | ICD-10-CM

## 2015-03-31 DIAGNOSIS — E785 Hyperlipidemia, unspecified: Secondary | ICD-10-CM

## 2015-03-31 DIAGNOSIS — Z8249 Family history of ischemic heart disease and other diseases of the circulatory system: Secondary | ICD-10-CM

## 2015-03-31 DIAGNOSIS — E119 Type 2 diabetes mellitus without complications: Secondary | ICD-10-CM | POA: Diagnosis not present

## 2015-03-31 LAB — POCT GLYCOSYLATED HEMOGLOBIN (HGB A1C): Hemoglobin A1C: 6.1

## 2015-03-31 LAB — GLUCOSE, POCT (MANUAL RESULT ENTRY): POC GLUCOSE: 105 mg/dL — AB (ref 70–99)

## 2015-03-31 MED ORDER — LISINOPRIL 10 MG PO TABS: 10.0000 mg | ORAL_TABLET | Freq: Every day | ORAL | Status: DC

## 2015-03-31 NOTE — Patient Instructions (Signed)
Stop Atenolol.  Start lisinopril 10mg  once per day.  Keep a record of your blood pressures outside of the office and bring them to the next office visit. If the readings remain over 140/90 in next 1 week - increase lisinopril to 2 tablets each day. Follow up with me in 6 weeks to recheck blood pressure. You should receive a call or letter about your lab results within the next week to 10 days.   Return to the clinic or go to the nearest emergency room if any of your symptoms worsen or new symptoms occur.

## 2015-03-31 NOTE — Progress Notes (Addendum)
Subjective:    Patient ID: [name], male    DOB: May 02, 1961, 54 y.o.   MRN: [id number]  This chart was scribed for [name], MD by Stephania Fragmin, ED Scribe. This patient was seen in room 22 and the patient's care was started at 4:15 PM.   HPI  HPI Comments: [name] is a 54 y.o. male who presents to the [facility] to establish care and for medication refills.            Chart Review: Former patient of Dr. Larose Kells. Has a history of hypertension, DM, and hyperlipidemia. Elevated LFTs (consistent with fatty liver on U/S; negative Hep B and C screen).   Family History Patient states his grandmother at an old age, mother at around age 28, and aunt at around 64 all had CHF; his mother passed away from it at age 60. His father passed in August 2015 from Stage IV lung cancer, and he had a history of smoking. Patient denies a family history of CAD otherwise.   Patient has never had a cardiac stress test. He denies a history of any heart arrhythmias.   Hypertension Last renal function was in October 2015. Normal, with creatinine 0.7.  Patient is taking atenolol for hypertension. Patient takes his BP, which is usually between 588-502 systolic, over 77-41 diastolic. He denies using any ACE inhibitors. Patient has NKDA to any hypertension medication. He denies lightheadedness or dizziness.    Hyperlipidemia Lab Results  Component Value Date   CHOL 130 09/20/2014   HDL 39.50 09/20/2014   LDLCALC 59 09/20/2014   LDLDIRECT 58.4 06/15/2011   TRIG 158.0* 09/20/2014   CHOLHDL 3 09/20/2014   Stable lipids on lipitor 10 mg.   Patient last ate about 8 hours ago, and has fasted since.   DM Last A1c October 2015, controlled at 5.8 on Metformin 500 mg BID. He is on aspirin 300 mg qd.   Patient was diagnosed with DM "a few years ago." He denies any side effects with Metformin.   Social History Patient has worked with [employer] for 21 years. He has [employer].  Misc. Patient was prescribed Niaspan ER 1000 mg 2 tablets a day by Dr. Larose Kells, which was expensive. He then switched to OTC [employer] Niacin 1000 mg qd.   Patient Active Problem List   Diagnosis Date Noted  . Elevated LFTs 09/20/2013  . Annual physical exam 04/21/2012  . Thyromegaly 04/21/2012  . SKIN LESION 01/13/2010  . Diabetes type 2, controlled 06/12/2007  . HYPERLIPIDEMIA 01/04/2007  . Hypertension 01/04/2007   Past Medical History  Diagnosis Date  . Hyperlipidemia   . Hypertension   . Diabetes mellitus 2008  . Fatty liver 2008    Fatty liver, saw GI     Past Surgical History  Procedure Laterality Date  . No past surgeries     No Known Allergies Prior to Admission medications   Medication Sig Start Date End Date Taking? Authorizing Provider  aspirin 325 MG tablet Take 325 mg by mouth daily.     Yes Historical Provider, MD  atenolol (TENORMIN) 100 MG tablet Take 1 tablet (100 mg total) by mouth daily. 03/11/15  Yes Colon Branch, MD  atorvastatin (LIPITOR) 10 MG tablet TAKE 1 TABLET AT BEDTIME 08/23/14  Yes Colon Branch, MD  fish oil-omega-3 fatty acids 1000 MG capsule Take 4 g by mouth daily.    Yes Historical Provider, MD  metFORMIN (  GLUCOPHAGE) 500 MG tablet TAKE 1 TABLET TWICE A DAY WITH MEALS 11/22/14  Yes Colon Branch, MD  multivitamin-iron-minerals-folic acid (CENTRUM) chewable tablet Chew 1 tablet by mouth daily.     Yes Historical Provider, MD  Niacin CR 1000 MG TBCR Take 1 tablet by mouth daily.    Historical Provider, MD   History   Social History  . Marital Status: Single    Spouse Name: N/A  . Number of Children: 0  . Years of Education: N/A   Occupational History  . [employer]   .     Social History Main Topics  . Smoking status: Never Smoker   . Smokeless tobacco: Never Used  . Alcohol Use: No  . Drug Use: No  . Sexual Activity: Not on file   Other Topics Concern  . Not on file   Social History Narrative   Lives by himself                  Review of Systems  Constitutional: Negative for fatigue and unexpected weight change.  Eyes: Negative for visual disturbance.  Respiratory: Negative for cough, chest tightness and shortness of breath.   Cardiovascular: Negative for chest pain, palpitations and leg swelling.  Gastrointestinal: Negative for abdominal pain and blood in stool.  Neurological: Negative for dizziness, light-headedness and headaches.       Objective:   Physical Exam  Constitutional: He is oriented to person, place, and time. He appears well-developed and well-nourished.  HENT:  Head: Normocephalic and atraumatic.  Eyes: EOM are normal. Pupils are equal, round, and reactive to light.  Neck: No JVD present. Carotid bruit is not present.  Cardiovascular: Normal rate, regular rhythm and normal heart sounds.   No murmur heard. Pulmonary/Chest: Effort normal and breath sounds normal. He has no rales.  Musculoskeletal: He exhibits no edema.  Neurological: He is alert and oriented to person, place, and time.  Skin: Skin is warm and dry.  Psychiatric: He has a normal mood and affect.  Nursing note and vitals reviewed.  Filed Vitals:   03/31/15 1528  BP: 136/84  Pulse: 74  Temp: 98.8 F (37.1 C)  TempSrc: Oral  Resp: 16  Height: 5\' 5"  (1.651 m)  Weight: 199 lb 9.6 oz (90.538 kg)  SpO2: 93%      Results for orders placed or performed in visit on 03/31/15  POCT glycosylated hemoglobin (Hb A1C)  Result Value Ref Range   Hemoglobin A1C 6.1   POCT glucose (manual entry)  Result Value Ref Range   POC Glucose 105 (A) 70 - 99 mg/dl    EKG: sinus rhythm, no acute findings.   Assessment & Plan:   [name] is a 54 y.o. male Diabetes type 2, controlled - Plan: HM Diabetes Foot Exam, POCT glycosylated hemoglobin (Hb A1C), POCT glucose (manual entry), EKG 12-Lead  - controlled. No changes in regimen.   Essential hypertension - Plan: Comprehensive metabolic panel, lisinopril (PRINIVIL,ZESTRIL)  10 MG tablet, EKG 12-Lead  - stable, but will change to ACE-I for nephroprotection with DM. Start at 10mg  lisnopril QD then if remains elevated - increase to 20mg  as discussed below. Recheck in 6 weeks.   Hyperlipidemia - Plan: Lipid panel, EKG 12-Lead, Family history of CHF (congestive heart failure) - Plan: EKG 12-Lead  - no concerning findings on EKG. Risk factors of CAD and CHF discussed and tight DM control and bp control recommended. Rtc/er precautions if DOE or new fatigue/chest symptoms.  Meds ordered this encounter  Medications  . lisinopril (PRINIVIL,ZESTRIL) 10 MG tablet    Sig: Take 1 tablet (10 mg total) by mouth daily.    Dispense:  90 tablet    Refill:  1   Patient Instructions  Stop Atenolol.  Start lisinopril 10mg  once per day.  Keep a record of your blood pressures outside of the office and bring them to the next office visit. If the readings remain over 140/90 in next 1 week - increase lisinopril to 2 tablets each day. Follow up with me in 6 weeks to recheck blood pressure. You should receive a call or letter about your lab results within the next week to 10 days.   Return to the clinic or go to the nearest emergency room if any of your symptoms worsen or new symptoms occur.         I personally performed the services described in this documentation, which was scribed in my presence. The recorded information has been reviewed and considered, and addended by me as needed.

## 2015-04-01 LAB — LIPID PANEL
CHOLESTEROL: 119 mg/dL (ref 0–200)
HDL: 45 mg/dL (ref >=40)
LDL CALC: 42 mg/dL (ref 0–99)
TRIGLYCERIDES: 158 mg/dL — AB (ref <150)
Total CHOL/HDL Ratio: 2.6 Ratio
VLDL: 32 mg/dL (ref 0–40)

## 2015-04-01 LAB — COMPREHENSIVE METABOLIC PANEL
ALT: 70 U/L — ABNORMAL HIGH (ref 0–53)
AST: 49 U/L — ABNORMAL HIGH (ref 0–37)
Albumin: 4.5 g/dL (ref 3.5–5.2)
Alkaline Phosphatase: 57 U/L (ref 39–117)
BUN: 10 mg/dL (ref 6–23)
CALCIUM: 9.6 mg/dL (ref 8.4–10.5)
CHLORIDE: 103 meq/L (ref 96–112)
CO2: 21 meq/L (ref 19–32)
Creat: 0.74 mg/dL (ref 0.50–1.35)
Glucose, Bld: 102 mg/dL — ABNORMAL HIGH (ref 70–99)
POTASSIUM: 3.8 meq/L (ref 3.5–5.3)
SODIUM: 140 meq/L (ref 135–145)
TOTAL PROTEIN: 7.1 g/dL (ref 6.0–8.3)
Total Bilirubin: 1 mg/dL (ref 0.2–1.2)

## 2015-05-04 ENCOUNTER — Encounter: Payer: Self-pay | Admitting: Family Medicine

## 2015-05-13 ENCOUNTER — Other Ambulatory Visit: Payer: Self-pay

## 2015-05-13 MED ORDER — ATORVASTATIN CALCIUM 10 MG PO TABS: 10.0000 mg | ORAL_TABLET | Freq: Every day | ORAL | Status: DC

## 2015-05-13 MED ORDER — METFORMIN HCL 500 MG PO TABS: 500.0000 mg | ORAL_TABLET | Freq: Two times a day (BID) | ORAL | Status: DC

## 2015-05-16 NOTE — Telephone Encounter (Signed)
meds refilled 05/13/15.

## 2015-06-20 ENCOUNTER — Ambulatory Visit: Payer: Self-pay | Admitting: Family Medicine

## 2015-07-04 ENCOUNTER — Ambulatory Visit (INDEPENDENT_AMBULATORY_CARE_PROVIDER_SITE_OTHER): Payer: BLUE CROSS/BLUE SHIELD | Admitting: Family Medicine

## 2015-07-04 ENCOUNTER — Encounter: Payer: Self-pay | Admitting: Family Medicine

## 2015-07-04 VITALS — BP 144/87 | HR 104 | Temp 98.1°F | Resp 16 | Ht 65.0 in | Wt 199.0 lb

## 2015-07-04 DIAGNOSIS — I10 Essential (primary) hypertension: Secondary | ICD-10-CM

## 2015-07-04 DIAGNOSIS — Z114 Encounter for screening for human immunodeficiency virus [HIV]: Secondary | ICD-10-CM

## 2015-07-04 DIAGNOSIS — E785 Hyperlipidemia, unspecified: Secondary | ICD-10-CM | POA: Diagnosis not present

## 2015-07-04 DIAGNOSIS — R7989 Other specified abnormal findings of blood chemistry: Secondary | ICD-10-CM

## 2015-07-04 DIAGNOSIS — E119 Type 2 diabetes mellitus without complications: Secondary | ICD-10-CM

## 2015-07-04 DIAGNOSIS — R945 Abnormal results of liver function studies: Secondary | ICD-10-CM

## 2015-07-04 LAB — POCT GLYCOSYLATED HEMOGLOBIN (HGB A1C): HEMOGLOBIN A1C: 6.4

## 2015-07-04 LAB — GLUCOSE, POCT (MANUAL RESULT ENTRY): POC Glucose: 100 mg/dl — AB (ref 70–99)

## 2015-07-04 MED ORDER — METFORMIN HCL 500 MG PO TABS: 500.0000 mg | ORAL_TABLET | Freq: Two times a day (BID) | ORAL | Status: DC

## 2015-07-04 MED ORDER — ATORVASTATIN CALCIUM 10 MG PO TABS: 10.0000 mg | ORAL_TABLET | Freq: Every day | ORAL | Status: DC

## 2015-07-04 MED ORDER — LISINOPRIL-HYDROCHLOROTHIAZIDE 10-12.5 MG PO TABS: 1.0000 | ORAL_TABLET | Freq: Every day | ORAL | Status: DC

## 2015-07-04 MED ORDER — BLOOD GLUCOSE METER KIT: PACK | Status: AC

## 2015-07-04 NOTE — Progress Notes (Signed)
Subjective:   This chart was scribed for Cristian Ray, MD by Erling Conte, Medical Scribe. This patient was seen in Room 28 and the patient's care was started at 4:21 PM.   Patient ID: [name], male    DOB: 09-Nov-1961, 54 y.o.   MRN: [id number]  Chief Complaint  Patient presents with  . Hypertension  . Diabetes  . Hyperlipidemia    HPI  [name] is a 54 y.o. male   1. Diabetes Mellitus Lab Results  Component Value Date   HGBA1C 6.1 03/31/2015  Pt is on metformin 500 mg BID. Aspirin 325 mg QD. He takes Lipitor 10 mg per day. Controlled at his last visit in April. No changes in mediations. His last urine microalbumin was in October 2015 which was normal.  Pt has not been checking his BS at home [facility]   07/04/15 1546  Weight: 199 lb (90.266 kg)  Pt had a fall back in May which he states has impacted his exercise. He states he is eating 3 meals per day. He states he typically eats fast food 3-4x per week. He does drink soda Body mass index is 33.12 kg/(m^2).    2. Hyperlipidemia Lab Results  Component Value Date   CHOL 119 03/31/2015   HDL 45 03/31/2015   LDLCALC 42 03/31/2015   LDLDIRECT 58.4 06/15/2011   TRIG 158* 03/31/2015   CHOLHDL 2.6 03/31/2015  Controlled on lipitor. He had borderline elevated LFTs in April 2016 but have been elevated in the past as well. LFTs were stable from pervious readings. He denies any abdominal pain, nausea or vomiting.   3. Hypertension Lab Results  Component Value Date   CREATININE 0.74 03/31/2015  Pressure is slightly elevated today at 144/87. He had an EKG last visit without any acute findings.  Outside BP readings range from 129-152/86-102, HR has ranged from 84-114   4. Dentist He has not followed up with a dentist recently but is planning on making appt  5. Eye Doctor Pt has not seen any eye doctor in the past year.  Patient Active Problem List   Diagnosis Date Noted  . Elevated LFTs  09/20/2013  . Annual physical exam 04/21/2012  . Thyromegaly 04/21/2012  . SKIN LESION 01/13/2010  . Diabetes type 2, controlled 06/12/2007  . HYPERLIPIDEMIA 01/04/2007  . Hypertension 01/04/2007   Past Medical History  Diagnosis Date  . Hyperlipidemia   . Hypertension   . Diabetes mellitus 2008  . Fatty liver 2008    Fatty liver, saw GI     Past Surgical History  Procedure Laterality Date  . No past surgeries     No Known Allergies Prior to Admission medications   Medication Sig Start Date End Date Taking? Authorizing Provider  aspirin 325 MG tablet Take 325 mg by mouth daily.     Yes Historical Provider, MD  atorvastatin (LIPITOR) 10 MG tablet Take 1 tablet (10 mg total) by mouth at bedtime. 05/13/15  Yes [name], MD  fish oil-omega-3 fatty acids 1000 MG capsule Take 4 g by mouth daily.    Yes Historical Provider, MD  lisinopril (PRINIVIL,ZESTRIL) 10 MG tablet Take 1 tablet (10 mg total) by mouth daily. 03/31/15  Yes [name], MD  metFORMIN (GLUCOPHAGE) 500 MG tablet Take 1 tablet (500 mg total) by mouth 2 (two) times daily with a meal. 05/13/15  Yes Chelle Jeffery, PA-C  multivitamin-iron-minerals-folic acid (CENTRUM) chewable tablet Chew 1 tablet by mouth daily.  Yes Historical Provider, MD  Niacin CR 1000 MG TBCR Take 1 tablet by mouth daily.   Yes Historical Provider, MD   History   Social History  . Marital Status: Single    Spouse Name: N/A  . Number of Children: 0  . Years of Education: N/A   Occupational History  . [employer]   .     Social History Main Topics  . Smoking status: Never Smoker   . Smokeless tobacco: Never Used  . Alcohol Use: No  . Drug Use: No  . Sexual Activity: Not on file   Other Topics Concern  . Not on file   Social History Narrative   Lives by himself                 Review of Systems  Constitutional: Negative for fatigue and unexpected weight change.  Eyes: Negative for visual disturbance.  Respiratory: Negative  for cough, chest tightness and shortness of breath.   Cardiovascular: Negative for chest pain, palpitations and leg swelling.  Gastrointestinal: Negative for abdominal pain and blood in stool.  Neurological: Negative for dizziness, light-headedness and headaches.       Objective:   Physical Exam  Constitutional: He is oriented to person, place, and time. He appears well-developed and well-nourished.  HENT:  Head: Normocephalic and atraumatic.  Eyes: EOM are normal. Pupils are equal, round, and reactive to light.  Neck: No JVD present. Carotid bruit is not present.  Cardiovascular: Normal rate, regular rhythm and normal heart sounds.   No murmur heard. Pulmonary/Chest: Effort normal and breath sounds normal. He has no rales.  Musculoskeletal: He exhibits no edema.       Right lower leg: He exhibits no swelling.       Left lower leg: He exhibits no swelling.  Neurological: He is alert and oriented to person, place, and time.  Skin: Skin is warm and dry.  Psychiatric: He has a normal mood and affect.  Vitals reviewed.   Filed Vitals:   07/04/15 1546  BP: 144/87  Pulse: 104  Temp: 98.1 F (36.7 C)  TempSrc: Oral  Resp: 16  Height: _0  (1.651 m)  Weight: 199 lb (90.266 kg)  SpO2: 93%   Results for orders placed or performed in visit on 07/04/15  POCT glycosylated hemoglobin (Hb A1C)  Result Value Ref Range   Hemoglobin A1C 6.4   POCT glucose (manual entry)  Result Value Ref Range   POC Glucose 100 (A) 70 - 99 mg/dl       Assessment & Plan:   [name] is a 54 y.o. male Essential hypertension - Plan: lisinopril-hydrochlorothiazide (PRINZIDE,ZESTORETIC) 10-12.5 MG per tablet  - decreased control,  Will add HCTZ 12.49m.  SED, orthostatic precautions.   Type 2 diabetes mellitus without complication - Plan: metFORMIN (GLUCOPHAGE) 500 MG tablet, blood glucose meter kit and supplies, POCT glycosylated hemoglobin (Hb A1C), POCT glucose (manual entry)  -controlled.  continue same dose of meds.   Hyperlipidemia - Plan: atorvastatin (LIPITOR) 10 MG tablet  - recheck lipids. Continue same dose lipitor for now.   Elevated LFTs - Plan: Hepatic Function Panel  -posbble fatty liver, stable lfts prior. Repeat today.   Screening for HIV (human immunodeficiency virus) - Plan: HIV antibody.  Has not had tested in past. No know current rf's and no known hx of STI's.   Meds ordered this encounter  Medications  . metFORMIN (GLUCOPHAGE) 500 MG tablet    Sig: Take 1 tablet (500  mg total) by mouth 2 (two) times daily with a meal.    Dispense:  180 tablet    Refill:  1  . atorvastatin (LIPITOR) 10 MG tablet    Sig: Take 1 tablet (10 mg total) by mouth at bedtime.    Dispense:  90 tablet    Refill:  1  . blood glucose meter kit and supplies    Sig: Dispense based on patient and insurance preference. Use up to four times daily as directed. (FOR ICD-9 250.00, 250.01).    Dispense:  1 each    Refill:  0    Order Specific Question:  Number of strips    Answer:  43    Order Specific Question:  Number of lancets    Answer:  90  . lisinopril-hydrochlorothiazide (PRINZIDE,ZESTORETIC) 10-12.5 MG per tablet    Sig: Take 1 tablet by mouth daily.    Dispense:  90 tablet    Refill:  3   Patient Instructions  New blood pressure medicine is a combination pill containing both lisinopril and water pill. Watch for low blood pressure symptoms on this new medicine as discussed.  You should receive a call or letter about your lab results within the next week to 10 days.   Follow up in 3 months to recheck blood pressure on the new medicine and your diabetes.  If both are stable at that time - may be able to extend visit to 6 months.   Return to the clinic or go to the nearest emergency room if any of your symptoms worsen or new symptoms occur.     I personally performed the services described in this documentation, which was scribed in my presence. The recorded information  has been reviewed and considered, and addended by me as needed.

## 2015-07-04 NOTE — Patient Instructions (Addendum)
New blood pressure medicine is a combination pill containing both lisinopril and water pill. Watch for low blood pressure symptoms on this new medicine as discussed.  You should receive a call or letter about your lab results within the next week to 10 days.   Follow up in 3 months to recheck blood pressure on the new medicine and your diabetes.  If both are stable at that time - may be able to extend visit to 6 months.   Return to the clinic or go to the nearest emergency room if any of your symptoms worsen or new symptoms occur.

## 2015-07-05 LAB — HEPATIC FUNCTION PANEL
ALBUMIN: 4.6 g/dL (ref 3.6–5.1)
ALT: 70 U/L — ABNORMAL HIGH (ref 9–46)
AST: 48 U/L — AB (ref 10–35)
Alkaline Phosphatase: 63 U/L (ref 40–115)
Bilirubin, Direct: 0.2 mg/dL (ref <=0.2)
Indirect Bilirubin: 0.6 mg/dL (ref 0.2–1.2)
TOTAL PROTEIN: 7.2 g/dL (ref 6.1–8.1)
Total Bilirubin: 0.8 mg/dL (ref 0.2–1.2)

## 2015-07-06 LAB — HIV ANTIBODY (ROUTINE TESTING W REFLEX): HIV 1&2 Ab, 4th Generation: NONREACTIVE

## 2015-07-07 ENCOUNTER — Other Ambulatory Visit: Payer: Self-pay

## 2015-07-07 MED ORDER — GLUCOSE BLOOD VI STRP: ORAL_STRIP | Status: AC

## 2015-07-31 ENCOUNTER — Encounter: Payer: Self-pay | Admitting: Family Medicine

## 2015-10-10 ENCOUNTER — Ambulatory Visit (INDEPENDENT_AMBULATORY_CARE_PROVIDER_SITE_OTHER): Payer: BLUE CROSS/BLUE SHIELD | Admitting: Family Medicine

## 2015-10-10 ENCOUNTER — Encounter: Payer: Self-pay | Admitting: Family Medicine

## 2015-10-10 VITALS — BP 130/84 | HR 88 | Temp 98.7°F | Resp 16 | Ht 65.75 in | Wt 195.4 lb

## 2015-10-10 DIAGNOSIS — Z23 Encounter for immunization: Secondary | ICD-10-CM

## 2015-10-10 DIAGNOSIS — E119 Type 2 diabetes mellitus without complications: Secondary | ICD-10-CM

## 2015-10-10 DIAGNOSIS — E785 Hyperlipidemia, unspecified: Secondary | ICD-10-CM | POA: Diagnosis not present

## 2015-10-10 DIAGNOSIS — I10 Essential (primary) hypertension: Secondary | ICD-10-CM | POA: Diagnosis not present

## 2015-10-10 DIAGNOSIS — R945 Abnormal results of liver function studies: Secondary | ICD-10-CM

## 2015-10-10 DIAGNOSIS — R7989 Other specified abnormal findings of blood chemistry: Secondary | ICD-10-CM | POA: Diagnosis not present

## 2015-10-10 LAB — LIPID PANEL
CHOLESTEROL: 124 mg/dL — AB (ref 125–200)
HDL: 34 mg/dL — AB (ref >=40)
LDL Cholesterol: 53 mg/dL (ref <130)
TRIGLYCERIDES: 184 mg/dL — AB (ref <150)
Total CHOL/HDL Ratio: 3.6 Ratio (ref <=5.0)
VLDL: 37 mg/dL — ABNORMAL HIGH (ref <30)

## 2015-10-10 LAB — COMPLETE METABOLIC PANEL WITH GFR
ALT: 64 U/L — ABNORMAL HIGH (ref 9–46)
AST: 43 U/L — ABNORMAL HIGH (ref 10–35)
Albumin: 4.7 g/dL (ref 3.6–5.1)
Alkaline Phosphatase: 64 U/L (ref 40–115)
BUN: 10 mg/dL (ref 7–25)
CALCIUM: 10 mg/dL (ref 8.6–10.3)
CO2: 30 mmol/L (ref 20–31)
Chloride: 100 mmol/L (ref 98–110)
Creat: 0.83 mg/dL (ref 0.70–1.33)
GFR, Est African American: >89 mL/min (ref >=60)
Glucose, Bld: 137 mg/dL — ABNORMAL HIGH (ref 65–99)
POTASSIUM: 4.8 mmol/L (ref 3.5–5.3)
Sodium: 140 mmol/L (ref 135–146)
Total Bilirubin: 0.9 mg/dL (ref 0.2–1.2)
Total Protein: 7 g/dL (ref 6.1–8.1)

## 2015-10-10 LAB — GLUCOSE, POCT (MANUAL RESULT ENTRY): POC Glucose: 139 mg/dl — AB (ref 70–99)

## 2015-10-10 LAB — POCT GLYCOSYLATED HEMOGLOBIN (HGB A1C): HEMOGLOBIN A1C: 6.4

## 2015-10-10 NOTE — Patient Instructions (Signed)
Blood pressure looks better. No changes in medications for now.  Follow up in about 3 months for diabetes.  Work on increased exercise as discussed to continue weight loss.

## 2015-10-10 NOTE — Progress Notes (Signed)
 Subjective:    Patient ID: [name], male    DOB: 07/07/1961, 54 y.o.   MRN: [id number] This chart was scribed for Jeffrey Greene, MD by Richard Sun, Medical Scribe. This patient was seen in Room 26 and the patient's care was started at 8:40 AM.    HPI HPI Comments: [name] is a 54 y.o. male with a history of hypertension, hyperlipidemia and DM who presents to the [facility] for a follow-up for diabetes and hypertension. Last seen by me in July, 2016.   Hypertension: Slightly elevated 144/87 last visit. Added HCTZ 12.5 mg qd in addition to the lisinopril; he has been taking lisinopril-HCTZ 10-12.5 mg. He has been getting blood pressure readings around 130/80-90. Patient denies new side effects with the HCTZ.  Diabetes: Controlled last visit with A1c 6.4. He has been getting blood sugars around 85-90 typically. No side effects with medications. Patient denies symptomatic lows.  Wt Readings from Last 3 Encounters:  10/10/15 195 lb 6.4 oz (88.633 kg)  07/04/15 199 lb (90.266 kg)  03/31/15 199 lb 9.6 oz (90.538 kg)    Hyperlipidemia: Overall stable. 10 mg Lipitor a day. Lab Results  Component Value Date   CHOL 119 03/31/2015   HDL 45 03/31/2015   LDLCALC 42 03/31/2015   LDLDIRECT 58.4 06/15/2011   TRIG 158* 03/31/2015   CHOLHDL 2.6 03/31/2015    Elevated liver function tests: These were stable over the past year and thought to be due to fatty liver. He has been walking for exercise, about 30-60 minutes a day, 3 days a week. Patient denies abdominal pain and nausea. He also denies alcohol and drug use. Lab Results  Component Value Date   ALT 70* 07/04/2015   AST 48* 07/04/2015   ALKPHOS 63 07/04/2015   BILITOT 0.8 07/04/2015    Health maintenance: Received flu vaccine today.    Patient Active Problem List   Diagnosis Date Noted  . Elevated LFTs 09/20/2013  . Annual physical exam 04/21/2012  . Thyromegaly 04/21/2012  . SKIN LESION  01/13/2010  . Diabetes type 2, controlled ([facility]) 06/12/2007  . HYPERLIPIDEMIA 01/04/2007  . Hypertension 01/04/2007   Past Medical History  Diagnosis Date  . Hyperlipidemia   . Hypertension   . Diabetes mellitus 2008  . Fatty liver 2008    Fatty liver, saw GI     Past Surgical History  Procedure Laterality Date  . No past surgeries     No Known Allergies Prior to Admission medications   Medication Sig Start Date End Date Taking? Authorizing Provider  aspirin 325 MG tablet Take 325 mg by mouth daily.      Historical Provider, MD  atorvastatin (LIPITOR) 10 MG tablet Take 1 tablet (10 mg total) by mouth at bedtime. 07/04/15   Jeffrey R Greene, MD  blood glucose meter kit and supplies Dispense based on patient and insurance preference. Use up to four times daily as directed. (FOR ICD-9 250.00, 250.01). 07/04/15   Jeffrey R Greene, MD  fish oil-omega-3 fatty acids 1000 MG capsule Take 4 g by mouth daily.     Historical Provider, MD  glucose blood test strip Use to test blood sugar up to 4 times daily as directed. Dx code: E11.9 07/07/15   Jeffrey R Greene, MD  lisinopril-hydrochlorothiazide (PRINZIDE,ZESTORETIC) 10-12.5 MG per tablet Take 1 tablet by mouth daily. 07/04/15   Jeffrey R Greene, MD  metFORMIN (GLUCOPHAGE) 500 MG tablet Take 1 tablet (500 mg total)   by mouth 2 (two) times daily with a meal. 07/04/15   [name], MD  multivitamin-iron-minerals-folic acid (CENTRUM) chewable tablet Chew 1 tablet by mouth daily.      Historical Provider, MD  Niacin CR 1000 MG TBCR Take 1 tablet by mouth daily.    Historical Provider, MD   Social History   Social History  . Marital Status: Single    Spouse Name: N/A  . Number of Children: 0  . Years of Education: N/A   Occupational History  . [employer]   .     Social History Main Topics  . Smoking status: Never Smoker   . Smokeless tobacco: Never Used  . Alcohol Use: No  . Drug Use: No  . Sexual Activity: Not on file   Other Topics  Concern  . Not on file   Social History Narrative   Lives by himself                  Review of Systems  Constitutional: Negative for fatigue and unexpected weight change.  Eyes: Negative for visual disturbance.  Respiratory: Negative for cough, chest tightness and shortness of breath.   Cardiovascular: Negative for chest pain, palpitations and leg swelling.  Gastrointestinal: Negative for nausea, abdominal pain and blood in stool.  Neurological: Negative for dizziness, light-headedness and headaches.       Objective:   Physical Exam  Constitutional: He is oriented to person, place, and time. He appears well-developed and well-nourished.  HENT:  Head: Normocephalic and atraumatic.  Eyes: EOM are normal. Pupils are equal, round, and reactive to light.  Neck: No JVD present. Carotid bruit is not present.  Cardiovascular: Normal rate, regular rhythm and normal heart sounds.   No murmur heard. Pulmonary/Chest: Effort normal and breath sounds normal. He has no rales.  Musculoskeletal: He exhibits no edema.  Neurological: He is alert and oriented to person, place, and time.  Skin: Skin is warm and dry.  Psychiatric: He has a normal mood and affect.  Vitals reviewed.  Results for orders placed or performed in visit on 10/10/15  POCT glucose (manual entry)  Result Value Ref Range   POC Glucose 139 (A) 70 - 99 mg/dl  POCT glycosylated hemoglobin (Hb A1C)  Result Value Ref Range   Hemoglobin A1C 6.4       Filed Vitals:   10/10/15 0818  BP: 130/84  Pulse: 88  Temp: 98.7 F (37.1 C)  TempSrc: Oral  Resp: 16  Height: 5' 5.75" (1.67 m)  Weight: 195 lb 6.4 oz (88.633 kg)  SpO2: 97%       Assessment & Plan:  [name] is a 54 y.o. male Controlled type 2 diabetes mellitus without complication, without long-term current use of insulin ([place]) - Plan: POCT glucose (manual entry), POCT glycosylated hemoglobin (Hb A1C), Microalbumin, urine  -controlled. No med  changes.   Flu vaccine need - Plan: Flu Vaccine QUAD 36+ mos IM given.   Essential hypertension - Plan: COMPLETE METABOLIC PANEL WITH GFR  -overall stable, no med changes.   Elevated liver function tests - Plan: COMPLETE METABOLIC PANEL WITH GFR  -stable prior, repeat labs. Cont to work on weight loss.   Hyperlipidemia - Plan: COMPLETE METABOLIC PANEL WITH GFR, Lipid panel  -repeat labs, cont same dose Lipitor for now.   No orders of the defined types were placed in this encounter.   Patient Instructions  Blood pressure looks better. No changes in medications for now.  Follow  up in about 3 months for diabetes.  Work on increased exercise as discussed to continue weight loss.     I personally performed the services described in this documentation, which was scribed in my presence. The recorded information has been reviewed and considered, and addended by me as needed.    By signing my name below, I, Zola Button, attest that this documentation has been prepared under the direction and in the presence of Merri Ray, MD.  Electronically Signed: Zola Button, Medical Scribe. 10/10/2015. 8:48 AM.

## 2015-10-11 LAB — MICROALBUMIN, URINE: Microalb, Ur: <0.2 mg/dL

## 2015-10-17 ENCOUNTER — Encounter: Payer: Self-pay | Admitting: Family Medicine

## 2015-10-19 ENCOUNTER — Encounter: Payer: Self-pay | Admitting: Family Medicine

## 2015-10-19 NOTE — Addendum Note (Signed)
Addended by: Merri Ray R on: 10/19/2015 04:52 PM   Modules accepted: Orders

## 2015-11-07 ENCOUNTER — Other Ambulatory Visit: Payer: Self-pay | Admitting: Family Medicine

## 2015-11-07 DIAGNOSIS — R7989 Other specified abnormal findings of blood chemistry: Secondary | ICD-10-CM

## 2015-11-07 DIAGNOSIS — R945 Abnormal results of liver function studies: Principal | ICD-10-CM

## 2015-12-16 ENCOUNTER — Ambulatory Visit
Admission: RE | Admit: 2015-12-16 | Discharge: 2015-12-16 | Disposition: A | Discharge status: Home / Self Care | Payer: BLUE CROSS/BLUE SHIELD | Source: Ambulatory Visit | Attending: Family Medicine | Admitting: Family Medicine

## 2015-12-16 DIAGNOSIS — R945 Abnormal results of liver function studies: Principal | ICD-10-CM

## 2015-12-16 DIAGNOSIS — R7989 Other specified abnormal findings of blood chemistry: Secondary | ICD-10-CM

## 2016-01-09 ENCOUNTER — Ambulatory Visit (INDEPENDENT_AMBULATORY_CARE_PROVIDER_SITE_OTHER): Payer: BLUE CROSS/BLUE SHIELD | Admitting: Family Medicine

## 2016-01-09 VITALS — BP 126/83 | HR 108 | Temp 98.1°F | Resp 16 | Ht 66.0 in | Wt 196.0 lb

## 2016-01-09 DIAGNOSIS — R7989 Other specified abnormal findings of blood chemistry: Secondary | ICD-10-CM | POA: Diagnosis not present

## 2016-01-09 DIAGNOSIS — E785 Hyperlipidemia, unspecified: Secondary | ICD-10-CM

## 2016-01-09 DIAGNOSIS — E119 Type 2 diabetes mellitus without complications: Secondary | ICD-10-CM | POA: Diagnosis not present

## 2016-01-09 DIAGNOSIS — R945 Abnormal results of liver function studies: Secondary | ICD-10-CM

## 2016-01-09 LAB — COMPLETE METABOLIC PANEL WITH GFR
ALBUMIN: 4.7 g/dL (ref 3.6–5.1)
ALK PHOS: 67 U/L (ref 40–115)
ALT: 90 U/L — ABNORMAL HIGH (ref 9–46)
AST: 74 U/L — ABNORMAL HIGH (ref 10–35)
BUN: 10 mg/dL (ref 7–25)
CALCIUM: 10 mg/dL (ref 8.6–10.3)
CHLORIDE: 98 mmol/L (ref 98–110)
CO2: 30 mmol/L (ref 20–31)
Creat: 0.78 mg/dL (ref 0.70–1.33)
Glucose, Bld: 131 mg/dL — ABNORMAL HIGH (ref 65–99)
POTASSIUM: 4.2 mmol/L (ref 3.5–5.3)
Sodium: 139 mmol/L (ref 135–146)
Total Bilirubin: 0.8 mg/dL (ref 0.2–1.2)
Total Protein: 7.2 g/dL (ref 6.1–8.1)

## 2016-01-09 LAB — POCT GLYCOSYLATED HEMOGLOBIN (HGB A1C): Hemoglobin A1C: 6.7

## 2016-01-09 LAB — GLUCOSE, POCT (MANUAL RESULT ENTRY): POC GLUCOSE: 140 mg/dL — AB (ref 70–99)

## 2016-01-09 LAB — HEPATITIS B SURFACE ANTIBODY, QUANTITATIVE: HEPATITIS B-POST: 0 m[IU]/mL

## 2016-01-09 LAB — HEPATITIS B SURFACE ANTIGEN: HEP B S AG: NEGATIVE

## 2016-01-09 LAB — HEPATITIS C ANTIBODY: HCV AB: NEGATIVE

## 2016-01-09 MED ORDER — ATORVASTATIN CALCIUM 10 MG PO TABS: 10.0000 mg | ORAL_TABLET | Freq: Every day | ORAL | Status: DC

## 2016-01-09 MED ORDER — METFORMIN HCL 500 MG PO TABS: 500.0000 mg | ORAL_TABLET | Freq: Two times a day (BID) | ORAL | Status: DC

## 2016-01-09 NOTE — Patient Instructions (Signed)
You should receive a call or letter about your lab results within the next week to 10 days.  No changes in meds for now. Can follow up in 6 months for diabetes.  If heart rate over 100 at home or any palpitations (fast heartbeat or feel heart racing) - return for recheck.  Return to the clinic or go to the nearest emergency room if any of your symptoms worsen or new symptoms occur.

## 2016-01-09 NOTE — Progress Notes (Addendum)
Subjective:  By signing my name below, I, Moises Blood, attest that this documentation has been prepared under the direction and in the presence of Merri Ray, MD. Electronically Signed: Moises Blood, [place]. 01/09/2016 , 10:59 AM .  Patient was seen in Room 25 .   Patient ID: [name], male    DOB: [birth year], 55 y.o.   MRN: [id number] Chief Complaint  Patient presents with  . Diabetes  . Hypertension   HPI [name] is a 55 y.o. male Pt is here for follow up. He received a flu shot this season.   DM Lab Results  Component Value Date   HGBA1C 6.4 10/10/2015   He takes metformin 557m bid.   Wt Readings from Last 3 Encounters:  01/09/16 196 lb (88.905 kg)  10/10/15 195 lb 6.4 oz (88.633 kg)  07/04/15 199 lb (90.266 kg)   Lab Results  Component Value Date   MICROALBUR <0.2 10/10/2015   He's on aspirin qd.  He's also on statin.   He checks his sugar at home around 85-100. He denies symptomatic lows.   HTN He takes lisinopril-hctz 10-12.5 mg qd. He denies cough.   Lab Results  Component Value Date   CREATININE 0.83 10/10/2015   He was stable at last visit.   Elevated LFT's Lab Results  Component Value Date   ALT 64* 10/10/2015   AST 43* 10/10/2015   ALKPHOS 64 10/10/2015   BILITOT 0.9 10/10/2015   These have been stable. Recommended hep B and C tests, have not had yet. Did have RUQ ultrasound which was normal. Liver had no focal lesions, appeared normal. He denies alcohol use and herbal supplements.   Tachycardia Noted on triage today with heart rate of 108 and 111. Normal during Oct visit at 862 EKG April 2016 sinus rhythm rate 74. He denies chest pain, shortness of breath, chest tightness, blood in stool.   Family His sister has metastasis cancer (diagnosed in 22014/03/04 and he's been taking care of her. They have a hospice there to take care of her.  His father passed away in 204-Mar-2015   Patient Active Problem List   Diagnosis Date  Noted  . Elevated LFTs 09/20/2013  . Annual physical exam 04/21/2012  . Thyromegaly 04/21/2012  . SKIN LESION 01/13/2010  . [place]12[place]  . HYPERLIPIDEMIA 01/04/2007  . Hypertension 01/04/2007   Past Medical History  Diagnosis Date  . Hyperlipidemia   . Hypertension   . Diabetes mellitus 2March 03, 2008 . Fatty liver 203-03-08   Fatty liver, saw GI     Past Surgical History  Procedure Laterality Date  . No past surgeries     No Known Allergies Prior to Admission medications   Medication Sig Start Date End Date Taking? Authorizing Provider  aspirin 325 MG tablet Take 325 mg by mouth daily.      Historical Provider, MD  atorvastatin (LIPITOR) 10 MG tablet Take 1 tablet (10 mg total) by mouth at bedtime. 07/04/15   JWendie Agreste MD  blood glucose meter kit and supplies Dispense based on patient and insurance preference. Use up to four times daily as directed. (FOR ICD-9 250.00, 250.01). 07/04/15   JWendie Agreste MD  fish oil-omega-3 fatty acids 1000 MG capsule Take 4 g by mouth daily.     Historical Provider, MD  glucose blood test strip Use to test blood sugar up to 4 times daily as directed. Dx code: E11.9 07/07/15  [name], MD  lisinopril-hydrochlorothiazide (PRINZIDE,ZESTORETIC) 10-12.5 MG per tablet Take 1 tablet by mouth daily. 07/04/15   [name], MD  metFORMIN (GLUCOPHAGE) 500 MG tablet Take 1 tablet (500 mg total) by mouth 2 (two) times daily with a meal. 07/04/15   [name], MD  multivitamin-iron-minerals-folic acid (CENTRUM) chewable tablet Chew 1 tablet by mouth daily.      Historical Provider, MD  Niacin CR 1000 MG TBCR Take 1 tablet by mouth daily.    Historical Provider, MD   Social History   Social History  . Marital Status: Single    Spouse Name: N/A  . Number of Children: 0  . Years of Education: N/A   Occupational History  . [employer]   .     Social History Main Topics  . Smoking status: Never Smoker   .  Smokeless tobacco: Never Used  . Alcohol Use: No  . Drug Use: No  . Sexual Activity: Not on file   Other Topics Concern  . Not on file   Social History Narrative   Lives by himself                Review of Systems  Constitutional: Negative for fatigue and unexpected weight change.  Eyes: Negative for visual disturbance.  Respiratory: Negative for cough, chest tightness and shortness of breath.   Cardiovascular: Negative for chest pain, palpitations and leg swelling.  Gastrointestinal: Negative for abdominal pain and blood in stool.  Neurological: Negative for dizziness, light-headedness and headaches.       Objective:   Physical Exam  Constitutional: He is oriented to person, place, and time. He appears well-developed and well-nourished.  HENT:  Head: Normocephalic and atraumatic.  Eyes: EOM are normal. Pupils are equal, round, and reactive to light.  Neck: No JVD present. Carotid bruit is not present.  Cardiovascular: Normal rate, regular rhythm and normal heart sounds.   No murmur heard. Pulmonary/Chest: Effort normal and breath sounds normal. He has no rales.  Abdominal: Soft. Bowel sounds are normal. He exhibits no distension. There is no tenderness.  Musculoskeletal: He exhibits no edema.  Neurological: He is alert and oriented to person, place, and time.  Skin: Skin is warm and dry.  Psychiatric: He has a normal mood and affect.  Vitals reviewed.   Filed Vitals:   01/09/16 1023 01/09/16 1024  BP: 143/83 126/83  Pulse: 111 108  Temp: 98.1 F (36.7 C)   Resp: 16   Height: 5' 6"  (1.676 m)   Weight: 196 lb (88.905 kg)     Results for orders placed or performed in visit on 01/09/16  POCT glucose (manual entry)  Result Value Ref Range   POC Glucose 140 (A) 70 - 99 mg/dl  POCT glycosylated hemoglobin (Hb A1C)  Result Value Ref Range   Hemoglobin A1C 6.7        Assessment & Plan:   [name] is a 55 y.o. male Hyperlipidemia - Plan:  atorvastatin (LIPITOR) 10 MG tablet, COMPLETE METABOLIC PANEL WITH GFR  -Continue Lipitor for now, but consider holding this if LFTs remain elevated, and hep testing negative.  Controlled type 2 diabetes mellitus without complication, without long-term current use of insulin ([place]) - Plan: metFORMIN (GLUCOPHAGE) 500 MG tablet, POCT glucose (manual entry), POCT glycosylated hemoglobin (Hb A1C)  -Stable.  -Continue metformin 500 mg twice a day.   Elevated LFTs - Plan: COMPLETE METABOLIC PANEL WITH GFR Elevated liver function tests - Plan: Hepatitis B  surface antibody, Hepatitis B surface antigen, Hepatitis C antibody  -No concerning findings on ultrasound of liver, hepatitis testing pending, CMP pending, but consider holding statin if all these are normal.  RTC precautions.  Follow-up 6 months.  Heart rate slightly elevated in the office, advised to return to clinic or ER if any palpitations or symptomatic.  Meds ordered this encounter  Medications  . atorvastatin (LIPITOR) 10 MG tablet    Sig: Take 1 tablet (10 mg total) by mouth at bedtime.    Dispense:  90 tablet    Refill:  1  . metFORMIN (GLUCOPHAGE) 500 MG tablet    Sig: Take 1 tablet (500 mg total) by mouth 2 (two) times daily with a meal.    Dispense:  180 tablet    Refill:  1   Patient Instructions  You should receive a call or letter about your lab results within the next week to 10 days.  No changes in meds for now. Can follow up in 6 months for diabetes.  If heart rate over 100 at home or any palpitations (fast heartbeat or feel heart racing) - return for recheck.  Return to the clinic or go to the nearest emergency room if any of your symptoms worsen or new symptoms occur.     I personally performed the services described in this documentation, which was scribed in my presence. The recorded information has been reviewed and considered, and addended by me as needed.

## 2016-01-10 ENCOUNTER — Encounter: Payer: Self-pay | Admitting: Family Medicine

## 2016-02-03 NOTE — Addendum Note (Signed)
Addended by: Merri Ray R on: 02/03/2016 09:48 PM   Modules accepted: Orders

## 2016-05-01 ENCOUNTER — Ambulatory Visit (INDEPENDENT_AMBULATORY_CARE_PROVIDER_SITE_OTHER): Payer: BLUE CROSS/BLUE SHIELD | Admitting: Family Medicine

## 2016-05-01 VITALS — BP 122/80 | HR 107 | Temp 98.9°F | Resp 18 | Ht 66.0 in | Wt 195.0 lb

## 2016-05-01 DIAGNOSIS — H6692 Otitis media, unspecified, left ear: Secondary | ICD-10-CM

## 2016-05-01 DIAGNOSIS — J069 Acute upper respiratory infection, unspecified: Secondary | ICD-10-CM

## 2016-05-01 MED ORDER — AMOXICILLIN 500 MG PO CAPS: 500.0000 mg | ORAL_CAPSULE | Freq: Three times a day (TID) | ORAL | Status: DC

## 2016-05-01 NOTE — Progress Notes (Signed)
By signing my name below, I, [name], attest that this documentation has been prepared under the direction and in the presence of Cristian Ray, MD.  Electronically Signed: Verlee Benitez, Medical Scribe. 05/01/2016. 1:39 PM. Subjective:    Patient ID: [name], male    DOB: [birth year], 55 y.o.   MRN: [id number]  HPI Chief Complaint  Patient presents with  . URI  . Cough    HPI Comments: [name] is a 56 y.o. male with a  PMHx of DM, and hyperlipidemia who presents to the [facility] complaining of cough and URI onset 3 days ago. His last office visit was in January. Pt reports it started out in his chest and it progressed to nasal and facial congestion over the past couple of days. Pt took mucinex DM to relieve him of his symptons. Pt states he has sick contacts at work. Pt denies fever, trouble breathing, activity change, or appetite change. Pt doesn't have any allergies to abx.  Patient Active Problem List   Diagnosis Date Noted  . Elevated LFTs 09/20/2013  . Annual physical exam 04/21/2012  . Thyromegaly 04/21/2012  . SKIN LESION 01/13/2010  . Diabetes type 2, controlled ([place]) 06/12/2007  . HYPERLIPIDEMIA 01/04/2007  . Hypertension 01/04/2007   Past Medical History  Diagnosis Date  . Hyperlipidemia   . Hypertension   . Diabetes mellitus 2008  . Fatty liver 2008    Fatty liver, saw GI     Past Surgical History  Procedure Laterality Date  . No past surgeries     No Known Allergies Prior to Admission medications   Medication Sig Start Date End Date Taking? Authorizing Provider  aspirin 325 MG tablet Take 325 mg by mouth daily.      Historical Provider, MD  atorvastatin (LIPITOR) 10 MG tablet Take 1 tablet (10 mg total) by mouth at bedtime. 01/09/16   [name], MD  blood glucose meter kit and supplies Dispense based on patient and insurance preference. Use up to four times daily as directed. (FOR ICD-9 250.00, 250.01).  07/04/15   [name], MD  fish oil-omega-3 fatty acids 1000 MG capsule Take 4 g by mouth daily.     Historical Provider, MD  glucose blood test strip Use to test blood sugar up to 4 times daily as directed. Dx code: E11.9 07/07/15   [name], MD  lisinopril-hydrochlorothiazide (PRINZIDE,ZESTORETIC) 10-12.5 MG per tablet Take 1 tablet by mouth daily. 07/04/15   [name], MD  metFORMIN (GLUCOPHAGE) 500 MG tablet Take 1 tablet (500 mg total) by mouth 2 (two) times daily with a meal. 01/09/16   [name], MD  multivitamin-iron-minerals-folic acid (CENTRUM) chewable tablet Chew 1 tablet by mouth daily.      Historical Provider, MD  Niacin CR 1000 MG TBCR Take 1 tablet by mouth daily.    Historical Provider, MD   Social History   Social History  . Marital Status: Single    Spouse Name: N/A  . Number of Children: 0  . Years of Education: N/A   Occupational History  . [employer]   .     Social History Main Topics  . Smoking status: Never Smoker   . Smokeless tobacco: Never Used  . Alcohol Use: No  . Drug Use: No  . Sexual Activity: Not on file   Other Topics Concern  . Not on file   Social History Narrative   Lives by himself  Review of Systems  Constitutional: Negative for fever, activity change and appetite change.  HENT: Positive for congestion.   Respiratory: Positive for cough. Negative for shortness of breath.    Objective:  BP 122/80 mmHg  Pulse 107  Temp(Src) 98.9 F (37.2 C) (Oral)  Resp 18  Ht 5' 6"  (1.676 m)  Wt 195 lb (88.451 kg)  BMI 31.49 kg/m2  SpO2 96%  Physical Exam  Constitutional: He is oriented to person, place, and time. He appears well-developed and well-nourished.  HENT:  Head: Normocephalic and atraumatic.  Right Ear: Tympanic membrane, external ear and ear canal normal.  Left Ear: External ear and ear canal normal. Tympanic membrane is injected and erythematous.  Nose: No rhinorrhea.  Mouth/Throat:  Oropharynx is clear and moist and mucous membranes are normal. No oropharyngeal exudate or posterior oropharyngeal erythema.  Ear: clear to yellow fluid behind left tm Dry skin in canal bilaterally with some dry cerumen on the right Sinuses nontender  Eyes: Conjunctivae are normal. Pupils are equal, round, and reactive to light.  Neck: Neck supple.  Cardiovascular: Normal rate, regular rhythm, normal heart sounds and intact distal pulses.   No murmur heard. Pulmonary/Chest: Effort normal and breath sounds normal. He has no wheezes. He has no rhonchi. He has no rales.  Abdominal: Soft. There is no tenderness.  Lymphadenopathy:    He has no cervical adenopathy.  Neurological: He is alert and oriented to person, place, and time.  Skin: Skin is warm and dry. No rash noted.  Psychiatric: He has a normal mood and affect. His behavior is normal.  Vitals reviewed.  Assessment & Plan:   [name] is a 55 y.o. male Acute left otitis media, recurrence not specified, unspecified otitis media type - Plan: amoxicillin (AMOXIL) 500 MG capsule  Acute upper respiratory infection  Suspected viral upper respiratory infection. Possible early left otitis media, but may be due to virus as well.   -Mucinex or Mucinex DM, saline nasal spray, Tylenol if needed  -if ear pain not improving the next 24-48 hours, can start amoxicillin that was printed today. Side effects discussed, RTC precautions.  Meds ordered this encounter  Medications  . amoxicillin (AMOXIL) 500 MG capsule    Sig: Take 1 capsule (500 mg total) by mouth 3 (three) times daily.    Dispense:  30 capsule    Refill:  0   Patient Instructions       IF you received an x-Benitez today, you will receive an invoice from [facility]. Please contact [facility] at [phone] with questions or concerns regarding your invoice.   IF you received labwork today, you will receive an invoice from [facility]. Please contact [facility] at [phone] with questions or concerns regarding your invoice.   Our billing staff will not be able to assist you with questions regarding bills from these companies.  You will be contacted with the lab results as soon as they are available. The fastest way to get your results is to activate your My Chart account. Instructions are located on the last page of this paperwork. If you have not heard from [place] regarding the results in 2 weeks, please contact this office.    Your symptoms appear to be due to a virus or upper respiratory infection. He did have some findings of a possible early infection in the left ear, but can decide on starting antibiotics in the next 1-2 days if that ear is not improving. For now,  Tylenol as needed, Mucinex or Mucinex DM as needed for cough, and saline or salt water nasal spray a few times per day for nasal congestion. Make sure you're drinking plenty of fluids.Return to the clinic or go to the nearest emergency room if any of your symptoms worsen or new symptoms occur.   Upper Respiratory Infection, Adult Most upper respiratory infections (URIs) are a viral infection of the air passages leading to the lungs. A URI affects the nose, throat, and upper air passages. The most common type of URI is nasopharyngitis and is typically referred to as "the common cold." URIs run their course and usually go away on their own. Most of the time, a URI does not require medical attention, but sometimes a bacterial infection in the upper airways can follow a viral infection. This is called a secondary infection. Sinus and middle ear infections are common types of secondary upper respiratory infections. Bacterial pneumonia can also complicate a URI. A URI can worsen asthma and chronic obstructive pulmonary disease (COPD). Sometimes, these complications can require emergency medical care and may be life threatening.  CAUSES Almost all URIs are caused  by viruses. A virus is a type of germ and can spread from one person to another.  RISKS FACTORS You may be at risk for a URI if:   You smoke.   You have chronic heart or lung disease.  You have a weakened defense (immune) system.   You are very young or very old.   You have nasal allergies or asthma.  You work in crowded or poorly ventilated areas.  You work in health care facilities or schools. SIGNS AND SYMPTOMS  Symptoms typically develop 2-3 days after you come in contact with a cold virus. Most viral URIs last 7-10 days. However, viral URIs from the influenza virus (flu virus) can last 14-18 days and are typically more severe. Symptoms may include:   Runny or stuffy (congested) nose.   Sneezing.   Cough.   Sore throat.   Headache.   Fatigue.   Fever.   Loss of appetite.   Pain in your forehead, behind your eyes, and over your cheekbones (sinus pain).  Muscle aches.  DIAGNOSIS  Your health care provider may diagnose a URI by:  Physical exam.  Tests to check that your symptoms are not due to another condition such as:  Strep throat.  Sinusitis.  Pneumonia.  Asthma. TREATMENT  A URI goes away on its own with time. It cannot be cured with medicines, but medicines may be prescribed or recommended to relieve symptoms. Medicines may help:  Reduce your fever.  Reduce your cough.  Relieve nasal congestion. HOME CARE INSTRUCTIONS   Take medicines only as directed by your health care provider.   Gargle warm saltwater or take cough drops to comfort your throat as directed by your health care provider.  Use a warm mist humidifier or inhale steam from a shower to increase air moisture. This may make it easier to breathe.  Drink enough fluid to keep your urine clear or pale yellow.   Eat soups and other clear broths and maintain good nutrition.   Rest as needed.   Return to work when your temperature has returned to normal or as your  health care provider advises. You may need to stay home longer to avoid infecting others. You can also use a face mask and careful hand washing to prevent spread of the virus.  Increase the usage of your inhaler if  you have asthma.   Do not use any tobacco products, including cigarettes, chewing tobacco, or electronic cigarettes. If you need help quitting, ask your health care provider. PREVENTION  The best way to protect yourself from getting a cold is to practice good hygiene.   Avoid oral or hand contact with people with cold symptoms.   Wash your hands often if contact occurs.  There is no clear evidence that vitamin C, vitamin E, echinacea, or exercise reduces the chance of developing a cold. However, it is always recommended to get plenty of rest, exercise, and practice good nutrition.  SEEK MEDICAL CARE IF:   You are getting worse rather than better.   Your symptoms are not controlled by medicine.   You have chills.  You have worsening shortness of breath.  You have brown or red mucus.  You have yellow or brown nasal discharge.  You have pain in your face, especially when you bend forward.  You have a fever.  You have swollen neck glands.  You have pain while swallowing.  You have white areas in the back of your throat. SEEK IMMEDIATE MEDICAL CARE IF:   You have severe or persistent:  Headache.  Ear pain.  Sinus pain.  Chest pain.  You have chronic lung disease and any of the following:  Wheezing.  Prolonged cough.  Coughing up blood.  A change in your usual mucus.  You have a stiff neck.  You have changes in your:  Vision.  Hearing.  Thinking.  Mood. MAKE SURE YOU:   Understand these instructions.  Will watch your condition.  Will get help right away if you are not doing well or get worse.   This information is not intended to replace advice given to you by your health care provider. Make sure you discuss any questions you have  with your health care provider.   Document Released: 05/22/2001 Document Revised: 04/12/2015 Document Reviewed: 03/03/2014 Elsevier Interactive Patient Education 2016 Elsevier Inc.  Otitis Media, Adult Otitis media is redness, soreness, and inflammation of the middle ear. Otitis media may be caused by allergies or, most commonly, by infection. Often it occurs as a complication of the common cold. SIGNS AND SYMPTOMS Symptoms of otitis media may include:  Earache.  Fever.  Ringing in your ear.  Headache.  Leakage of fluid from the ear. DIAGNOSIS To diagnose otitis media, your health care provider will examine your ear with an otoscope. This is an instrument that allows your health care provider to see into your ear in order to examine your eardrum. Your health care provider also will ask you questions about your symptoms. TREATMENT  Typically, otitis media resolves on its own within 3-5 days. Your health care provider may prescribe medicine to ease your symptoms of pain. If otitis media does not resolve within 5 days or is recurrent, your health care provider may prescribe antibiotic medicines if he or she suspects that a bacterial infection is the cause. HOME CARE INSTRUCTIONS   If you were prescribed an antibiotic medicine, finish it all even if you start to feel better.  Take medicines only as directed by your health care provider.  Keep all follow-up visits as directed by your health care provider. SEEK MEDICAL CARE IF:  You have otitis media only in one ear, or bleeding from your nose, or both.  You notice a lump on your neck.  You are not getting better in 3-5 days.  You feel worse instead of better. SEEK  IMMEDIATE MEDICAL CARE IF:   You have pain that is not controlled with medicine.  You have swelling, redness, or pain around your ear or stiffness in your neck.  You notice that part of your face is paralyzed.  You notice that the bone behind your ear (mastoid) is  tender when you touch it. MAKE SURE YOU:   Understand these instructions.  Will watch your condition.  Will get help right away if you are not doing well or get worse.   This information is not intended to replace advice given to you by your health care provider. Make sure you discuss any questions you have with your health care provider.   Document Released: 08/31/2004 Document Revised: 12/17/2014 Document Reviewed: 06/23/2013 Elsevier Interactive Patient Education [employer].      I personally performed the services described in this documentation, which was scribed in my presence. The recorded information has been reviewed and considered, and addended by me as needed.

## 2016-05-01 NOTE — Patient Instructions (Addendum)
IF you received an x-ray today, you will receive an invoice from [facility]. Please contact [facility] at [phone] with questions or concerns regarding your invoice.   IF you received labwork today, you will receive an invoice from [employer]. Please contact [facility] at [phone] with questions or concerns regarding your invoice.   Our billing staff will not be able to assist you with questions regarding bills from these companies.  You will be contacted with the lab results as soon as they are available. The fastest way to get your results is to activate your My Chart account. Instructions are located on the last page of this paperwork. If you have not heard from [place] regarding the results in 2 weeks, please contact this office.    Your symptoms appear to be due to a virus or upper respiratory infection. He did have some findings of a possible early infection in the left ear, but can decide on starting antibiotics in the next 1-2 days if that ear is not improving. For now, Tylenol as needed, Mucinex or Mucinex DM as needed for cough, and saline or salt water nasal spray a few times per day for nasal congestion. Make sure you're drinking plenty of fluids.Return to the clinic or go to the nearest emergency room if any of your symptoms worsen or new symptoms occur.   Upper Respiratory Infection, Adult Most upper respiratory infections (URIs) are a viral infection of the air passages leading to the lungs. A URI affects the nose, throat, and upper air passages. The most common type of URI is nasopharyngitis and is typically referred to as "the common cold." URIs run their course and usually go away on their own. Most of the time, a URI does not require medical attention, but sometimes a bacterial infection in the upper airways can follow a viral infection. This is called a secondary infection. Sinus and middle ear infections are common types of  secondary upper respiratory infections. Bacterial pneumonia can also complicate a URI. A URI can worsen asthma and chronic obstructive pulmonary disease (COPD). Sometimes, these complications can require emergency medical care and may be life threatening.  CAUSES Almost all URIs are caused by viruses. A virus is a type of germ and can spread from one person to another.  RISKS FACTORS You may be at risk for a URI if:   You smoke.   You have chronic heart or lung disease.  You have a weakened defense (immune) system.   You are very young or very old.   You have nasal allergies or asthma.  You work in crowded or poorly ventilated areas.  You work in health care facilities or schools. SIGNS AND SYMPTOMS  Symptoms typically develop 2-3 days after you come in contact with a cold virus. Most viral URIs last 7-10 days. However, viral URIs from the influenza virus (flu virus) can last 14-18 days and are typically more severe. Symptoms may include:   Runny or stuffy (congested) nose.   Sneezing.   Cough.   Sore throat.   Headache.   Fatigue.   Fever.   Loss of appetite.   Pain in your forehead, behind your eyes, and over your cheekbones (sinus pain).  Muscle aches.  DIAGNOSIS  Your health care provider may diagnose a URI by:  Physical exam.  Tests to check that your symptoms are not due to another condition such as:  Strep throat.  Sinusitis.  Pneumonia.  Asthma. TREATMENT  A URI goes away on its own with time. It cannot be cured with medicines, but medicines may be prescribed or recommended to relieve symptoms. Medicines may help:  Reduce your fever.  Reduce your cough.  Relieve nasal congestion. HOME CARE INSTRUCTIONS   Take medicines only as directed by your health care provider.   Gargle warm saltwater or take cough drops to comfort your throat as directed by your health care provider.  Use a warm mist humidifier or inhale steam from a  shower to increase air moisture. This may make it easier to breathe.  Drink enough fluid to keep your urine clear or pale yellow.   Eat soups and other clear broths and maintain good nutrition.   Rest as needed.   Return to work when your temperature has returned to normal or as your health care provider advises. You may need to stay home longer to avoid infecting others. You can also use a face mask and careful hand washing to prevent spread of the virus.  Increase the usage of your inhaler if you have asthma.   Do not use any tobacco products, including cigarettes, chewing tobacco, or electronic cigarettes. If you need help quitting, ask your health care provider. PREVENTION  The best way to protect yourself from getting a cold is to practice good hygiene.   Avoid oral or hand contact with people with cold symptoms.   Wash your hands often if contact occurs.  There is no clear evidence that vitamin C, vitamin E, echinacea, or exercise reduces the chance of developing a cold. However, it is always recommended to get plenty of rest, exercise, and practice good nutrition.  SEEK MEDICAL CARE IF:   You are getting worse rather than better.   Your symptoms are not controlled by medicine.   You have chills.  You have worsening shortness of breath.  You have brown or red mucus.  You have yellow or brown nasal discharge.  You have pain in your face, especially when you bend forward.  You have a fever.  You have swollen neck glands.  You have pain while swallowing.  You have white areas in the back of your throat. SEEK IMMEDIATE MEDICAL CARE IF:   You have severe or persistent:  Headache.  Ear pain.  Sinus pain.  Chest pain.  You have chronic lung disease and any of the following:  Wheezing.  Prolonged cough.  Coughing up blood.  A change in your usual mucus.  You have a stiff neck.  You have changes in  your:  Vision.  Hearing.  Thinking.  Mood. MAKE SURE YOU:   Understand these instructions.  Will watch your condition.  Will get help right away if you are not doing well or get worse.   This information is not intended to replace advice given to you by your health care provider. Make sure you discuss any questions you have with your health care provider.   Document Released: 05/22/2001 Document Revised: 04/12/2015 Document Reviewed: 03/03/2014 Elsevier Interactive Patient Education 2016 Elsevier Inc.  Otitis Media, Adult Otitis media is redness, soreness, and inflammation of the middle ear. Otitis media may be caused by allergies or, most commonly, by infection. Often it occurs as a complication of the common cold. SIGNS AND SYMPTOMS Symptoms of otitis media may include:  Earache.  Fever.  Ringing in your ear.  Headache.  Leakage of fluid from the ear. DIAGNOSIS To diagnose otitis media, your health care provider will examine your ear  with an otoscope. This is an instrument that allows your health care provider to see into your ear in order to examine your eardrum. Your health care provider also will ask you questions about your symptoms. TREATMENT  Typically, otitis media resolves on its own within 3-5 days. Your health care provider may prescribe medicine to ease your symptoms of pain. If otitis media does not resolve within 5 days or is recurrent, your health care provider may prescribe antibiotic medicines if he or she suspects that a bacterial infection is the cause. HOME CARE INSTRUCTIONS   If you were prescribed an antibiotic medicine, finish it all even if you start to feel better.  Take medicines only as directed by your health care provider.  Keep all follow-up visits as directed by your health care provider. SEEK MEDICAL CARE IF:  You have otitis media only in one ear, or bleeding from your nose, or both.  You notice a lump on your neck.  You are not  getting better in 3-5 days.  You feel worse instead of better. SEEK IMMEDIATE MEDICAL CARE IF:   You have pain that is not controlled with medicine.  You have swelling, redness, or pain around your ear or stiffness in your neck.  You notice that part of your face is paralyzed.  You notice that the bone behind your ear (mastoid) is tender when you touch it. MAKE SURE YOU:   Understand these instructions.  Will watch your condition.  Will get help right away if you are not doing well or get worse.   This information is not intended to replace advice given to you by your health care provider. Make sure you discuss any questions you have with your health care provider.   Document Released: 08/31/2004 Document Revised: 12/17/2014 Document Reviewed: 06/23/2013 Elsevier Interactive Patient Education [employer].

## 2016-06-11 ENCOUNTER — Other Ambulatory Visit: Payer: Self-pay | Admitting: Family Medicine

## 2016-07-08 ENCOUNTER — Other Ambulatory Visit: Payer: Self-pay | Admitting: Family Medicine

## 2016-07-08 DIAGNOSIS — E785 Hyperlipidemia, unspecified: Secondary | ICD-10-CM

## 2016-07-08 DIAGNOSIS — E119 Type 2 diabetes mellitus without complications: Secondary | ICD-10-CM

## 2016-07-11 ENCOUNTER — Ambulatory Visit: Payer: BLUE CROSS/BLUE SHIELD | Admitting: Family Medicine

## 2016-07-12 ENCOUNTER — Ambulatory Visit (INDEPENDENT_AMBULATORY_CARE_PROVIDER_SITE_OTHER): Payer: BLUE CROSS/BLUE SHIELD | Admitting: Family Medicine

## 2016-07-12 ENCOUNTER — Encounter: Payer: Self-pay | Admitting: Family Medicine

## 2016-07-12 VITALS — BP 120/80 | HR 94 | Temp 98.1°F | Ht 65.25 in | Wt 189.0 lb

## 2016-07-12 DIAGNOSIS — E785 Hyperlipidemia, unspecified: Secondary | ICD-10-CM | POA: Diagnosis not present

## 2016-07-12 DIAGNOSIS — R945 Abnormal results of liver function studies: Secondary | ICD-10-CM

## 2016-07-12 DIAGNOSIS — R7989 Other specified abnormal findings of blood chemistry: Secondary | ICD-10-CM

## 2016-07-12 DIAGNOSIS — Z8041 Family history of malignant neoplasm of ovary: Secondary | ICD-10-CM | POA: Diagnosis not present

## 2016-07-12 DIAGNOSIS — Z8481 Family history of carrier of genetic disease: Secondary | ICD-10-CM

## 2016-07-12 DIAGNOSIS — E119 Type 2 diabetes mellitus without complications: Secondary | ICD-10-CM | POA: Diagnosis not present

## 2016-07-12 LAB — COMPLETE METABOLIC PANEL WITH GFR
ALBUMIN: 4.6 g/dL (ref 3.6–5.1)
ALK PHOS: 65 U/L (ref 40–115)
ALT: 60 U/L — AB (ref 9–46)
AST: 37 U/L — AB (ref 10–35)
BILIRUBIN TOTAL: 0.8 mg/dL (ref 0.2–1.2)
BUN: 12 mg/dL (ref 7–25)
CO2: 28 mmol/L (ref 20–31)
Calcium: 10 mg/dL (ref 8.6–10.3)
Chloride: 100 mmol/L (ref 98–110)
Creat: 0.87 mg/dL (ref 0.70–1.33)
Glucose, Bld: 114 mg/dL — ABNORMAL HIGH (ref 65–99)
POTASSIUM: 4.2 mmol/L (ref 3.5–5.3)
SODIUM: 139 mmol/L (ref 135–146)
TOTAL PROTEIN: 7.5 g/dL (ref 6.1–8.1)

## 2016-07-12 LAB — LIPID PANEL
CHOL/HDL RATIO: 4.7 ratio (ref <=5.0)
CHOLESTEROL: 192 mg/dL (ref 125–200)
HDL: 41 mg/dL (ref >=40)
LDL Cholesterol: 109 mg/dL (ref <130)
TRIGLYCERIDES: 212 mg/dL — AB (ref <150)
VLDL: 42 mg/dL — ABNORMAL HIGH (ref <30)

## 2016-07-12 NOTE — Progress Notes (Signed)
By signing my name below, I, Mesha Guinyard, attest that this documentation has been prepared under the direction and in the presence of Merri Ray, MD.  Electronically Signed: Verlee Monte, Medical Scribe. 07/12/16. 10:47 AM.  Subjective:    Patient ID: [name], male    DOB: [birth year], 55 y.o.   MRN: [id number]  HPI Chief Complaint  Patient presents with   Follow-up    6 mo follow up   Medication Refill    Lipitor,  Lisinopril, Metformin    Diabetes    sugars running 92-110 - takes 1-2xdaily    HPI Comments: [name] is a 55 y.o. male who presents to the [facility] for HTN, HLD, and DM follow-up. Pt is fasting today.  Dermatology: Pt is followed by [facility] and plans to go back next week for sun damage. Pt was told it's best to trest sun damage on his face in the fall.  Prostate Cancer: Pt's sister died 02/15/2016 of ovarian CA, and she was BRCA-2 gene positive. He was told he has a slight risk at developing CA from this gene. Pt would like to get screened for genetic CA testing. Pt's last prostate CA screening was Oct 2015 with nl result.  DM: He was stable in Jan. continued on Metformin 500 mg BID. Pt's blood sugars have been running 90-110 at home. Lab Results  Component Value Date   HGBA1C 6.7 01/09/2016   Lab Results  Component Value Date   MICROALBUR <0.2 10/10/2015   HLD: With his elevated LTFs he had a negative Hep C testing, a nl ultrasound of the liver. Recommended to hold the Lipitor to see if this was causing elevated liver test. How ever has not has his LFT's checked in this time. Pt continued taking Lipitor after his office visit, but stopped taking it this past month. Pt denies n/v, abdominal pain, or yellowing of the eyes or skin. Lab Results  Component Value Date   ALT 90 (H) 01/09/2016   AST 74 (H) 01/09/2016   ALKPHOS 67 01/09/2016   BILITOT 0.8 01/09/2016   Lab Results  Component Value Date   CHOL 124 (L) 10/10/2015   HDL 34 (L) 10/10/2015   LDLCALC 53 10/10/2015   LDLDIRECT 58.4 06/15/2011   TRIG 184 (H) 10/10/2015   CHOLHDL 3.6 10/10/2015    Patient Active Problem List   Diagnosis Date Noted   Elevated LFTs 09/20/2013   Annual physical exam 04/21/2012   Thyromegaly 04/21/2012   SKIN LESION 01/13/2010   Diabetes type 2, controlled ([place]) 06/12/2007   HYPERLIPIDEMIA 01/04/2007   Hypertension 01/04/2007   Past Medical History:  Diagnosis Date   Diabetes mellitus 02/14/2007   Fatty liver Feb 14, 2007   Fatty liver, saw GI     Hyperlipidemia    Hypertension    Past Surgical History:  Procedure Laterality Date   NO PAST SURGERIES     No Known Allergies Prior to Admission medications   Medication Sig Start Date End Date Taking? Authorizing Provider  amoxicillin (AMOXIL) 500 MG capsule Take 1 capsule (500 mg total) by mouth 3 (three) times daily. 05/01/16   [name], MD  aspirin 325 MG tablet Take 325 mg by mouth daily.      Historical Provider, MD  atorvastatin (LIPITOR) 10 MG tablet TAKE 1 TABLET AT BEDTIME 07/09/16   [name], MD  blood glucose meter kit and supplies Dispense based on patient and insurance preference. Use up  to four times daily as directed. (FOR ICD-9 250.00, 250.01). 07/04/15   [name], MD  fish oil-omega-3 fatty acids 1000 MG capsule Take 4 g by mouth daily.     Historical Provider, MD  glucose blood test strip Use to test blood sugar up to 4 times daily as directed. Dx code: E11.9 07/07/15   [name], MD  lisinopril-hydrochlorothiazide (PRINZIDE,ZESTORETIC) 10-12.5 MG tablet TAKE 1 TABLET DAILY 06/13/16   [name], MD  metFORMIN (GLUCOPHAGE) 500 MG tablet TAKE 1 TABLET TWICE A DAY WITH MEALS 07/09/16   [name], MD  multivitamin-iron-minerals-folic acid (CENTRUM) chewable tablet Chew 1 tablet by mouth daily.      Historical Provider, MD  Niacin CR 1000 MG TBCR Take 1 tablet by mouth daily.    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  Eyes: Negative for visual disturbance.  Respiratory: Negative for cough, chest tightness and shortness of breath.   Cardiovascular: Negative for chest pain, palpitations and leg swelling.  Gastrointestinal: Negative for abdominal pain, blood in stool, nausea and vomiting.  Skin: Negative for color change.  Neurological: Negative for dizziness, light-headedness and headaches.   Objective:  Physical Exam  Constitutional: He is oriented to person, place, and time. He appears well-developed and well-nourished. No distress.  HENT:  Head: Normocephalic and atraumatic.  Eyes: Conjunctivae and EOM are normal. Pupils are equal, round, and reactive to light.  Neck: Neck supple. No JVD present. Carotid bruit is not present.  Cardiovascular: Normal rate, regular rhythm and normal heart sounds.   No murmur heard. Pulmonary/Chest: Effort normal and breath sounds normal. He has no rales.  Musculoskeletal: He exhibits no edema.  Neurological: He is alert and oriented to person, place, and time.  Skin: Skin is warm and dry.  Psychiatric: He has a normal mood and affect. His behavior is normal.  Nursing note and vitals reviewed.  BP 120/80 (BP Location: Right Arm, Patient  Position: Sitting, Cuff Size: Large)    Pulse 94    Temp 98.1 F (36.7 C) (Oral)    Ht 5' 5.25" (1.657 m)    Wt 189 lb (85.7 kg)    SpO2 97%    BMI 31.21 kg/m  Assessment & Plan:   [name] is a 55 y.o. male Type 2 diabetes mellitus without complication, without long-term current use of insulin ([place]) - Plan: HM Diabetes Foot Exam, Hemoglobin A1C  -Check A1c. Continue metformin same dose for now.  Elevated LFTs - Plan: COMPLETE METABOLIC PANEL WITH GFR  -Repeat CMP.  Hold on statin for now.  Family history of ovarian cancer - Plan: Ambulatory referral to Genetics Family history of BRCA2 gene positive - Plan: Ambulatory referral to Genetics  -Will refer to genetic counselor to decide testing. See email after visit. Plans on having this done in January 2018.  Hyperlipidemia - Plan: COMPLETE METABOLIC PANEL WITH GFR, Lipid panel  -Check lipids, CMP. Holding on statin right now with elevated LFTs.  No orders of the defined types were placed in this encounter.  Patient Instructions   I will refer you to genetic counselor to discuss different testing. Okay to hold off on Lipitor for now until we see cholesterol and liver test readings. Follow up in 3 months for physical. We can discuss cancer screening further at that time.    IF you received an x-ray today, you will receive an invoice from [facility]. Please contact [facility] at [phone] with questions or concerns regarding your invoice.   IF you received labwork today, you will receive an invoice from [employer]. Please contact [facility] at [phone] with questions or concerns regarding your invoice.   Our billing staff will not be able to assist you with questions regarding bills from these companies.  You will be contacted with the lab results as soon as they are available. The fastest way to get your results is to activate your My Chart account. Instructions are located on  the last page of this paperwork. If you have not heard from [place] regarding the results in 2 weeks, please contact this office.       I personally performed the services described in this documentation, which was scribed in my presence. The recorded information has been reviewed and considered, and addended by me as needed.   Signed,   Merri Ray, MD [facility].  07/14/16 10:37 PM

## 2016-07-12 NOTE — Patient Instructions (Addendum)
I will refer you to genetic counselor to discuss different testing. Okay to hold off on Lipitor for now until we see cholesterol and liver test readings. Follow up in 3 months for physical. We can discuss cancer screening further at that time.    IF you received an x-ray today, you will receive an invoice from [facility]. Please contact [facility] at [phone] with questions or concerns regarding your invoice.   IF you received labwork today, you will receive an invoice from [employer]. Please contact [facility] at [phone] with questions or concerns regarding your invoice.   Our billing staff will not be able to assist you with questions regarding bills from these companies.  You will be contacted with the lab results as soon as they are available. The fastest way to get your results is to activate your My Chart account. Instructions are located on the last page of this paperwork. If you have not heard from [place] regarding the results in 2 weeks, please contact this office.

## 2016-07-13 LAB — HEMOGLOBIN A1C
Hgb A1c MFr Bld: 6.3 % — ABNORMAL HIGH (ref <5.7)
Mean Plasma Glucose: 134 mg/dL

## 2016-08-13 ENCOUNTER — Encounter: Payer: Self-pay | Admitting: Family Medicine

## 2016-08-13 ENCOUNTER — Other Ambulatory Visit: Payer: Self-pay | Admitting: Family Medicine

## 2016-08-13 DIAGNOSIS — E119 Type 2 diabetes mellitus without complications: Secondary | ICD-10-CM

## 2016-08-16 ENCOUNTER — Telehealth: Payer: Self-pay

## 2016-08-16 NOTE — Telephone Encounter (Signed)
Per patient from [facility] "Yes Dr.Greene I Sent A Request For Refill On METFORMIN Must Have Been A Mix Up I Only Got 60 Pills I Get My Meds In 76 Day Supply I Have Some In Another Bottle Not Many So I Need This Refilled ASAP Just The METFORMIN Only [facility] Thank You"

## 2016-08-17 MED ORDER — METFORMIN HCL 500 MG PO TABS: 500.0000 mg | ORAL_TABLET | Freq: Two times a day (BID) | ORAL | 0 refills | Status: DC

## 2016-08-21 NOTE — Telephone Encounter (Signed)
rx was sent in

## 2016-09-11 ENCOUNTER — Other Ambulatory Visit: Payer: Self-pay | Admitting: Family Medicine

## 2016-09-25 LAB — HM DIABETES EYE EXAM

## 2016-10-11 IMAGING — US US ABDOMEN LIMITED
1 series · 14 of 25 positions shown · non-contrast
Comparison: 11/21/2006

CLINICAL DATA: Abnormal liver function tests.

EXAM:
US ABDOMEN LIMITED - RIGHT UPPER QUADRANT

[Series 1: us abdomen limited · 0.32mm/px · 14 of 43 slices shown]
[im 1/43]
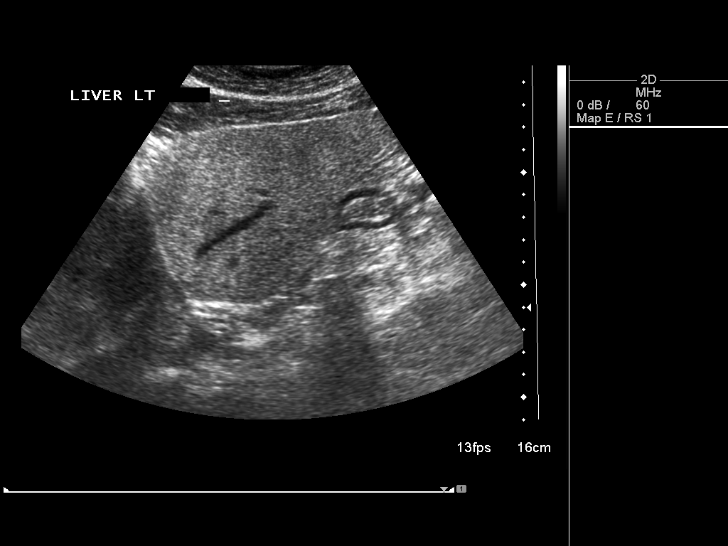
[im 4/43]
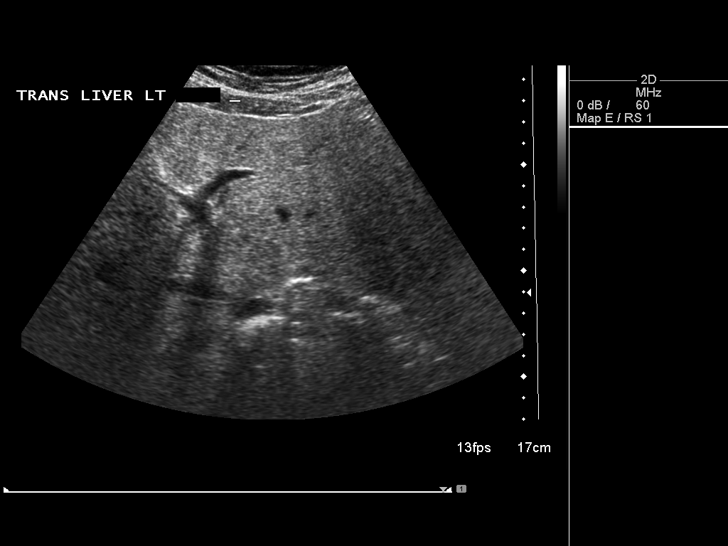
[im 8/43]
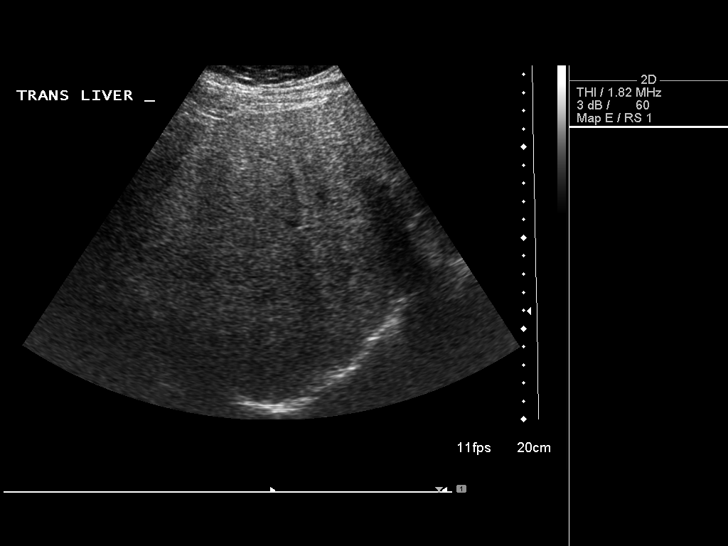
[im 11/43]
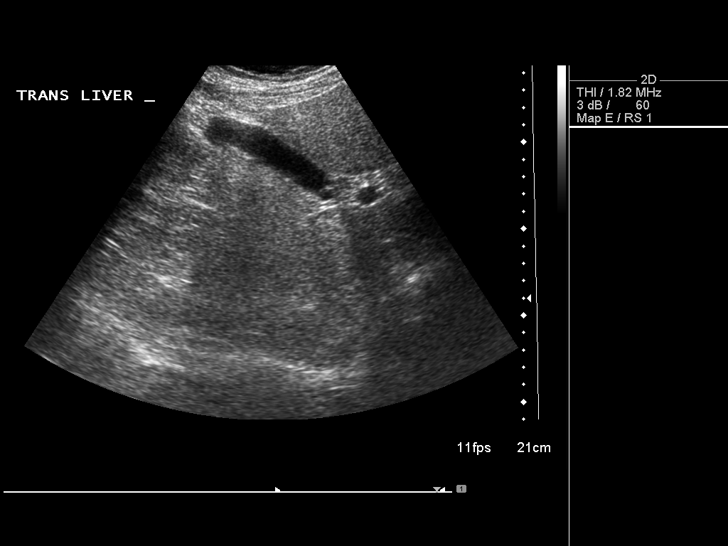
[im 15/43]
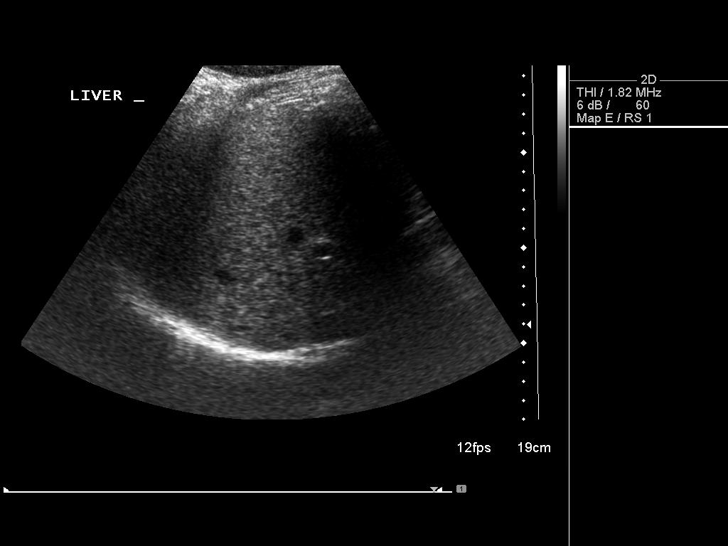
[im 16/43]
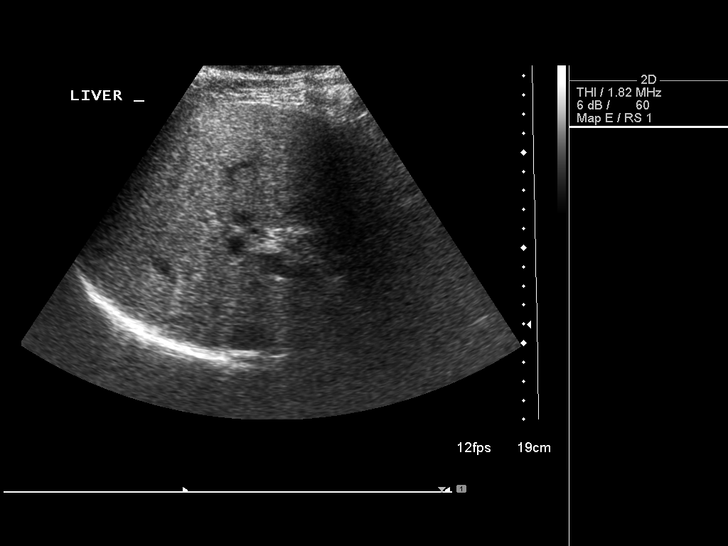
[im 20/43]
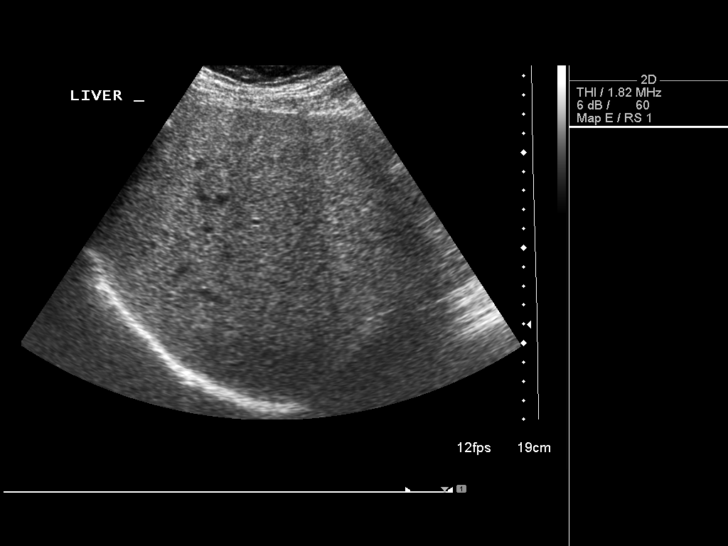
[im 23/43]
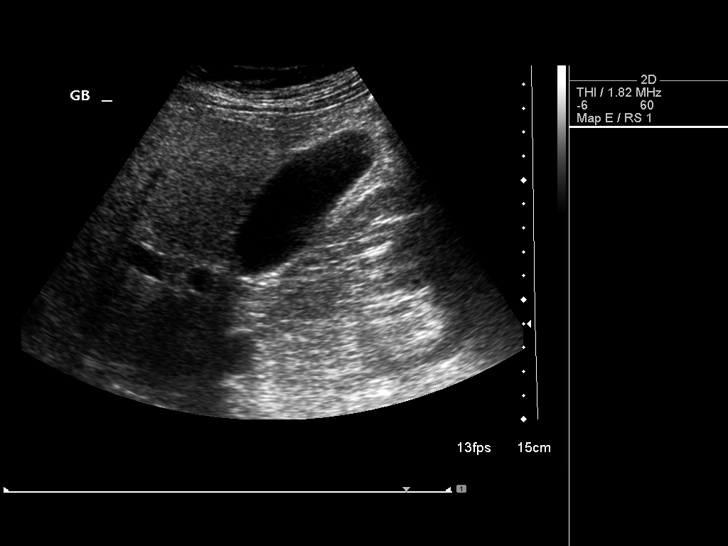
[im 27/43]
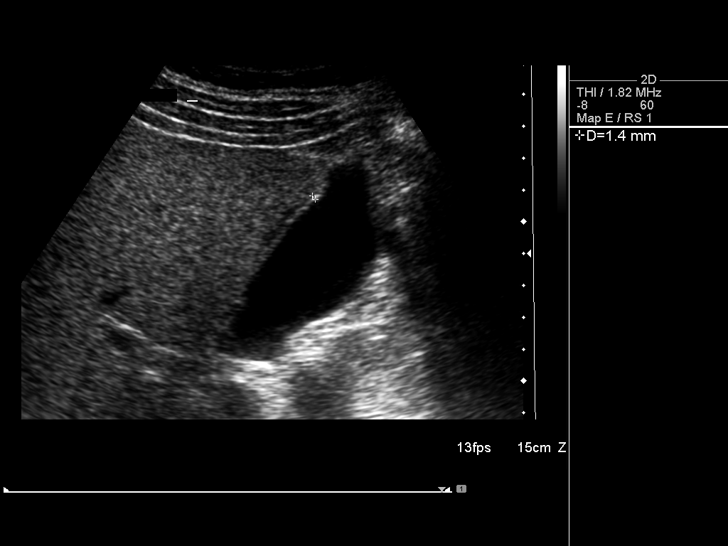
[im 29/43]
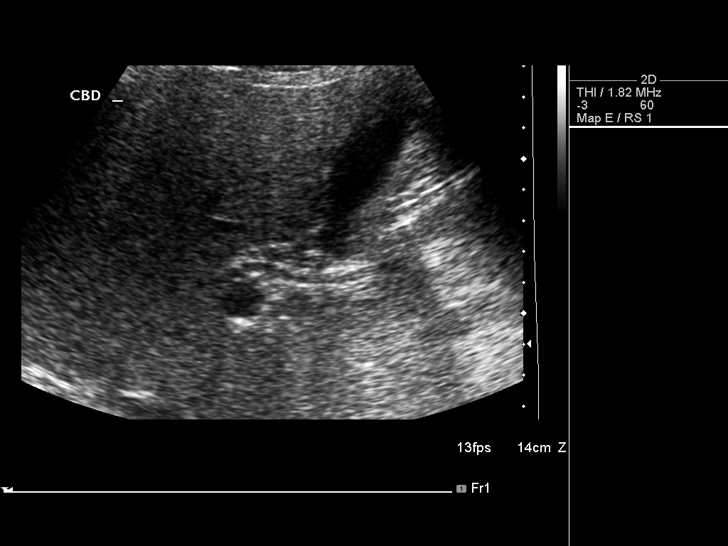
[im 32/43]
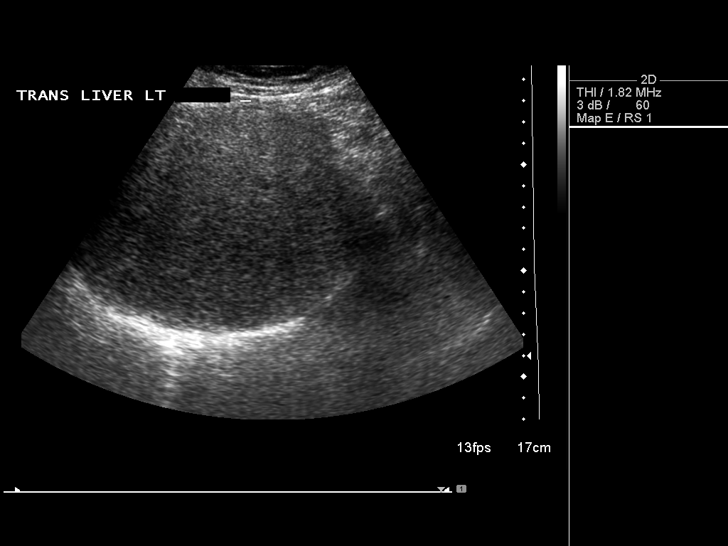
[im 36/43]
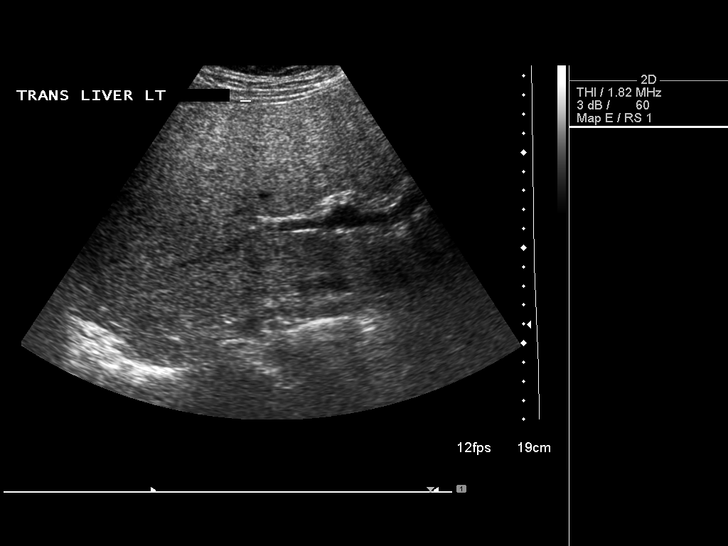
[im 39/43]
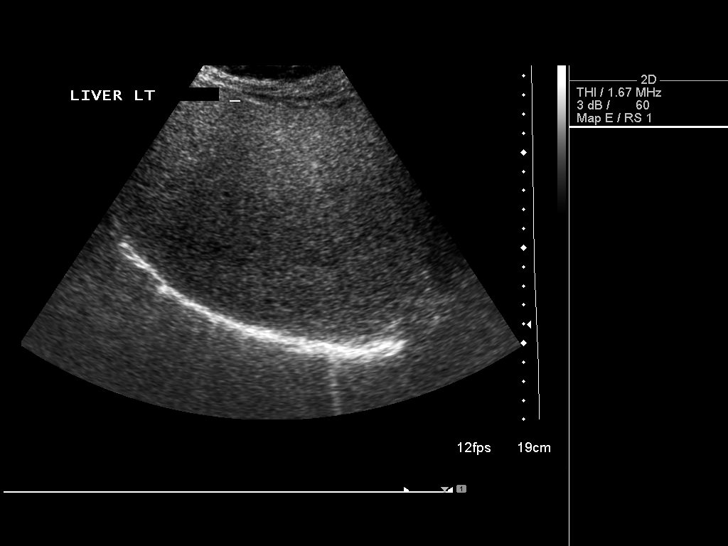
[im 43/43]
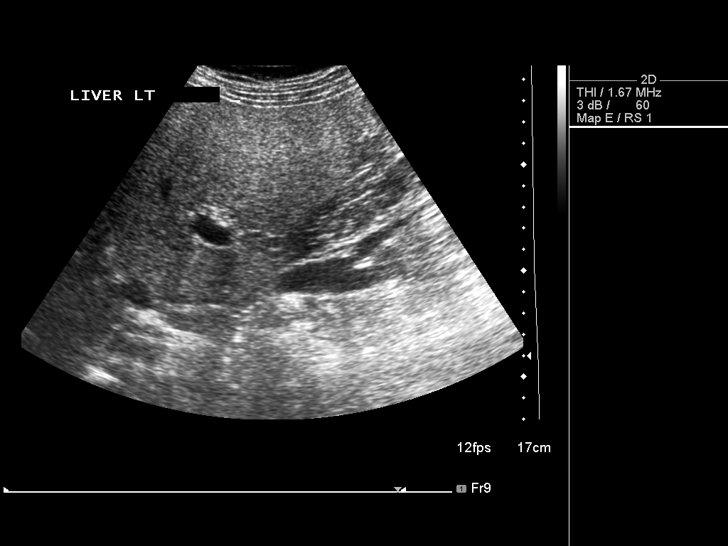

[14 of 25 positions shown; findings below may reference images not displayed]

FINDINGS: Gallbladder:

No gallstones or wall thickening visualized. No sonographic Murphy
sign noted by sonographer.

Common bile duct:

Diameter: 3.1 mm, normal

Liver:

No focal lesions.  Echogenicity within normal limits.
IMPRESSION: Normal ultrasound.

## 2016-10-18 ENCOUNTER — Ambulatory Visit (INDEPENDENT_AMBULATORY_CARE_PROVIDER_SITE_OTHER): Payer: BLUE CROSS/BLUE SHIELD | Admitting: Family Medicine

## 2016-10-18 ENCOUNTER — Encounter: Payer: Self-pay | Admitting: Family Medicine

## 2016-10-18 VITALS — BP 126/70 | HR 88 | Temp 97.9°F | Resp 18 | Ht 65.25 in | Wt 191.0 lb

## 2016-10-18 DIAGNOSIS — R945 Abnormal results of liver function studies: Secondary | ICD-10-CM

## 2016-10-18 DIAGNOSIS — I10 Essential (primary) hypertension: Secondary | ICD-10-CM | POA: Diagnosis not present

## 2016-10-18 DIAGNOSIS — E119 Type 2 diabetes mellitus without complications: Secondary | ICD-10-CM | POA: Diagnosis not present

## 2016-10-18 DIAGNOSIS — Z0000 Encounter for general adult medical examination without abnormal findings: Secondary | ICD-10-CM | POA: Diagnosis not present

## 2016-10-18 DIAGNOSIS — R7989 Other specified abnormal findings of blood chemistry: Secondary | ICD-10-CM | POA: Diagnosis not present

## 2016-10-18 DIAGNOSIS — Z1329 Encounter for screening for other suspected endocrine disorder: Secondary | ICD-10-CM

## 2016-10-18 DIAGNOSIS — Z23 Encounter for immunization: Secondary | ICD-10-CM

## 2016-10-18 DIAGNOSIS — Z125 Encounter for screening for malignant neoplasm of prostate: Secondary | ICD-10-CM

## 2016-10-18 DIAGNOSIS — E785 Hyperlipidemia, unspecified: Secondary | ICD-10-CM

## 2016-10-18 DIAGNOSIS — H6123 Impacted cerumen, bilateral: Secondary | ICD-10-CM | POA: Diagnosis not present

## 2016-10-18 LAB — COMPLETE METABOLIC PANEL WITH GFR
ALBUMIN: 4.9 g/dL (ref 3.6–5.1)
ALK PHOS: 57 U/L (ref 40–115)
ALT: 42 U/L (ref 9–46)
AST: 31 U/L (ref 10–35)
BUN: 12 mg/dL (ref 7–25)
CO2: 28 mmol/L (ref 20–31)
CREATININE: 0.86 mg/dL (ref 0.70–1.33)
Calcium: 10.2 mg/dL (ref 8.6–10.3)
Chloride: 98 mmol/L (ref 98–110)
GFR, Est African American: >89 mL/min (ref >=60)
GFR, Est Non African American: >89 mL/min (ref >=60)
GLUCOSE: 118 mg/dL — AB (ref 65–99)
POTASSIUM: 4 mmol/L (ref 3.5–5.3)
SODIUM: 139 mmol/L (ref 135–146)
Total Bilirubin: 0.8 mg/dL (ref 0.2–1.2)
Total Protein: 7.7 g/dL (ref 6.1–8.1)

## 2016-10-18 LAB — PSA: PSA: 0.9 ng/mL (ref <=4.0)

## 2016-10-18 LAB — HEMOGLOBIN A1C
HEMOGLOBIN A1C: 5.9 % — AB (ref <5.7)
Mean Plasma Glucose: 123 mg/dL

## 2016-10-18 LAB — MICROALBUMIN, URINE

## 2016-10-18 LAB — LIPID PANEL
CHOL/HDL RATIO: 5.2 ratio — AB (ref <5.0)
Cholesterol: 212 mg/dL — ABNORMAL HIGH (ref <200)
HDL: 41 mg/dL (ref >40)
LDL Cholesterol: 129 mg/dL — ABNORMAL HIGH
Triglycerides: 208 mg/dL — ABNORMAL HIGH (ref <150)
VLDL: 42 mg/dL — AB (ref <30)

## 2016-10-18 LAB — TSH: TSH: 2.38 m[IU]/L (ref 0.40–4.50)

## 2016-10-18 NOTE — Patient Instructions (Addendum)
For diabetes, no change in medications for now, I will check your A1c and urine test for protein. For blood pressure, no change in medications. I will check some lab work. Prostate cancer test was checked, keep follow-up with dermatologist as planned, and colonoscopy in 2019. Let me know in January if you need a referral to the genetic counselor to discuss if other testing is needed with your family history of the BRCA2 gene If your liver tests are elevated again, I will likely refer you to a gastroenterologist. We can continue to hold off on Lipitor for now until I see both your cholesterol and liver tests. If we decide to restart the Lipitor, I would want to see you back just for labs only to repeat your liver tests on Lipitor within a few weeks. Again, we can decide this once we see cholesterol result  Keeping you healthy  Get these tests  Blood pressure- Have your blood pressure checked once a year by your healthcare provider.  Normal blood pressure is 120/80  Weight- Have your body mass index (BMI) calculated to screen for obesity.  BMI is a measure of body fat based on height and weight. You can also calculate your own BMI at [employer]si.  Cholesterol- Have your cholesterol checked every year.  Diabetes- Have your blood sugar checked regularly if you have high blood pressure, high cholesterol, have a family history of diabetes or if you are overweight.  Screening for Colon Cancer- Colonoscopy starting at age 2.  Screening may begin sooner depending on your family history and other health conditions. Follow up colonoscopy as directed by your Gastroenterologist.  Screening for Prostate Cancer- Both blood work (PSA) and a rectal exam help screen for Prostate Cancer.  Screening begins at age 42 with African-American men and at age 49 with Caucasian men.  Screening may begin sooner depending on your family history.  Take these medicines  Aspirin- One aspirin daily can help  prevent Heart disease and Stroke.  Flu shot- Every fall.  Tetanus- Every 10 years.  Zostavax- Once after the age of 45 to prevent Shingles.  Pneumonia shot- Once after the age of 12; if you are younger than 85, ask your healthcare provider if you need a Pneumonia shot.  Take these steps  Don't smoke- If you do smoke, talk to your doctor about quitting.  For tips on how to quit, go to www.smokefree.gov or call 1-800-QUIT-NOW.  Be physically active- Exercise 5 days a week for at least 30 minutes.  If you are not already physically active start slow and gradually work up to 30 minutes of moderate physical activity.  Examples of moderate activity include walking briskly, mowing the yard, dancing, swimming, bicycling, etc.  Eat a healthy diet- Eat a variety of healthy food such as fruits, vegetables, low fat milk, low fat cheese, yogurt, lean meant, poultry, fish, beans, tofu, etc. For more information go to www.thenutritionsource.org  Drink alcohol in moderation- Limit alcohol intake to less than two drinks a day. Never drink and drive.  Dentist- Brush and floss twice daily; visit your dentist twice a year.  Depression- Your emotional health is as important as your physical health. If you're feeling down, or losing interest in things you would normally enjoy please talk to your healthcare provider.  Eye exam- Visit your eye doctor every year.  Safe sex- If you may be exposed to a sexually transmitted infection, use a condom.  Seat belts- Seat belts can save your life; always wear  one.  Smoke/Carbon Monoxide detectors- These detectors need to be installed on the appropriate level of your home.  Replace batteries at least once a year.  Skin cancer- When out in the sun, cover up and use sunscreen 15 SPF or higher.  Violence- If anyone is threatening you, please tell your healthcare provider.  Living Will/ Health care power of attorney- Speak with your healthcare provider and  family.   IF you received an x-ray today, you will receive an invoice from [facility]. Please contact [facility] Radiology at [phone] with questions or concerns regarding your invoice.   IF you received labwork today, you will receive an invoice from [employer]. Please contact [facility] at [phone] with questions or concerns regarding your invoice.   Our billing staff will not be able to assist you with questions regarding bills from these companies.  You will be contacted with the lab results as soon as they are available. The fastest way to get your results is to activate your My Chart account. Instructions are located on the last page of this paperwork. If you have not heard from [place] regarding the results in 2 weeks, please contact this office.

## 2016-10-18 NOTE — Progress Notes (Signed)
By signing my name below, I, Mesha Guinyard, attest that this documentation has been prepared under the direction and in the presence of Merri Ray, MD.  Electronically Signed: Verlee Monte, Medical Scribe. 10/18/16. 9:05 AM.  Subjective:    Patient ID: [name], male    DOB: March 04, 1961, 55 y.o.   MRN: [id number]  HPI Chief Complaint  Patient presents with  . Annual Exam    HPI Comments: [name] is a 55 y.o. male with a PMHx of Elevated LFTs and thyromegaly who presents to the [facility] for a complete physical. Last visit with me was in Aug, here for physical today.  DM: Remained on same dose as last visit. He was stable in Aug and continued on metformin 530m BID. Compliant with metformin and denies experiencing any negative side effects on this medication. Lab Results  Component Value Date   HGBA1C 6.3 (H) 07/12/2016   Lab Results  Component Value Date   MICROALBUR <0.2 10/10/2015   Elevated LFTs: Hx of neg Hep C testing and nl liver [place] He was off lipitor for 1 month prior to last visit with some improvement with LFTs, but still elevated. Lab Results  Component Value Date   ALT 60 (H) 07/12/2016   AST 37 (H) 07/12/2016   ALKPHOS 65 07/12/2016   BILITOT 0.8 07/12/2016   HTN: On lisinopril-HCTZ 10-12.523m Takes ASA QD. Compliant with lisinopril-HCTZ and denies experiencing any negative side effects while on this medication. Lab Results  Component Value Date   CREATININE 0.87 07/12/2016   BP Readings from Last 3 Encounters:  10/18/16 126/70  07/12/16 120/80  05/01/16 122/80   HLD: Prev on lipitor 1042mD, but had been off for 1 month when last checked in Aug. Has not been taking lipitor since last visit. Lab Results  Component Value Date   CHOL 192 07/12/2016   HDL 41 07/12/2016   LDLCALC 109 07/12/2016   LDLDIRECT 58.4 06/15/2011   TRIG 212 (H) 07/12/2016   CHOLHDL 4.7 07/12/2016   Cancer Screening: Prostate  CA: There is a FHx of BRCA-2 with sister having ovarian CA. He was referred to a genetic counselor for testing; plans on having this done in Jan. Last prostate screening was Oct 2015 with nl results. Pt has not set up a date for genetic counseling. Lab Results  Component Value Date   PSA 0.93 09/20/2014   PSA 0.83 09/17/2013   PSA 0.70 04/21/2012  Colon CA: Colonoscopy in 07/2013, Dr. StaFuller Planlanned for repeat in 5 years as tubular adenoma. Skin CA: [facility] Went to his dermatologist 09/25/2016 and was told he had sun damage on his face. Was given cream to start using the first week of Dec for 3 weeks and have a follow-up at the end of the 3 weeks.  Immunizations: Pt would like to get his flu vaccine today. Immunization History  Administered Date(s) Administered  . Influenza Split 09/15/2012  . Influenza Whole 09/09/2009, 08/10/2010  . Influenza,inj,Quad PF,36+ Mos 09/17/2013, 09/20/2014, 10/10/2015, 10/18/2016  . Pneumococcal Conjugate-13 09/20/2014  . Pneumococcal Polysaccharide-23 10/01/2007  . Td 02/25/2004  . Tdap 03/18/2014   Vision: Ophthalmologist visit 09/25/2016; no diabetic retinopathy found.  Visual Acuity Screening   Right eye Left eye Both eyes  Without correction:     With correction: _0   Dentist: Hasn't been to dentist in a while.  Exercise: 3x a week for at least 150 mins total.  Depression  Screening: Depression screen [facility] 2/9 10/18/2016 07/12/2016 05/01/2016 01/09/2016 10/10/2015  Decreased Interest 0 0 0 0 0  Down, Depressed, Hopeless 0 0 0 0 0  PHQ - 2 Score 0 0 0 0 0   Ears: Pt occasionally uses q-tips in his ears.  History thyromegaly Lab Results  Component Value Date   TSH 1.57 03/18/2014      Patient Active Problem List   Diagnosis Date Noted  . Elevated LFTs 09/20/2013  . Annual physical exam 04/21/2012  . Thyromegaly 04/21/2012  . SKIN LESION 01/13/2010  . Diabetes type 2, controlled ([place]) 06/12/2007  .  HYPERLIPIDEMIA 01/04/2007  . Hypertension 01/04/2007   Past Medical History:  Diagnosis Date  . Diabetes mellitus 2008  . Fatty liver 2008   Fatty liver, saw GI    . Hyperlipidemia   . Hypertension    Past Surgical History:  Procedure Laterality Date  . NO PAST SURGERIES     No Known Allergies Prior to Admission medications   Medication Sig Start Date End Date Taking? Authorizing Provider  aspirin 325 MG tablet Take 325 mg by mouth daily.     Yes Historical Provider, MD  atorvastatin (LIPITOR) 10 MG tablet TAKE 1 TABLET AT BEDTIME 07/09/16  Yes [name], MD  blood glucose meter kit and supplies Dispense based on patient and insurance preference. Use up to four times daily as directed. (FOR ICD-9 250.00, 250.01). 07/04/15  Yes [name], MD  fish oil-omega-3 fatty acids 1000 MG capsule Take 4 g by mouth daily.    Yes Historical Provider, MD  glucose blood test strip Use to test blood sugar up to 4 times daily as directed. Dx code: E11.9 07/07/15  Yes [name], MD  lisinopril-hydrochlorothiazide [facility]) 10-12.5 MG tablet TAKE 1 TABLET DAILY 09/11/16  Yes [name], MD  metFORMIN (GLUCOPHAGE) 500 MG tablet Take 1 tablet (500 mg total) by mouth 2 (two) times daily with a meal. 08/17/16  Yes [name], MD  multivitamin-iron-minerals-folic acid (CENTRUM) chewable tablet Chew 1 tablet by mouth daily.     Yes Historical Provider, MD   Social History   Social History  . Marital status: Single    Spouse name: N/A  . Number of children: 0  . Years of education: N/A   Occupational History  . [employer]   .  [employer]   Social History Main Topics  . Smoking status: Never Smoker  . Smokeless tobacco: Never Used  . Alcohol use No  . Drug use: No  . Sexual activity: Not on file   Other Topics Concern  . Not on file   Social History Narrative   Lives by himself                Review of Systems  13 point ROS is negative. Objective:    Physical Exam  Constitutional: He is oriented to person, place, and time. He appears well-developed and well-nourished.  HENT:  Head: Normocephalic and atraumatic.  Right Ear: External ear normal.  Left Ear: External ear normal.  Mouth/Throat: Oropharynx is clear and moist.  Moderate cerumen on the right  Eyes: Conjunctivae and EOM are normal. Pupils are equal, round, and reactive to light.  Neck: Normal range of motion. Neck supple. No thyromegaly present.  Cardiovascular: Normal rate, regular rhythm, normal heart sounds and intact distal pulses.   Pulmonary/Chest: Effort normal and breath sounds normal. No respiratory distress. He has no wheezes.  Abdominal: Soft.  He exhibits no distension. There is no tenderness. Hernia confirmed negative in the right inguinal area and confirmed negative in the left inguinal area.  Genitourinary: Prostate normal.  Musculoskeletal: Normal range of motion. He exhibits no edema or tenderness.  Lymphadenopathy:    He has no cervical adenopathy.  Neurological: He is alert and oriented to person, place, and time. He has normal reflexes.  Skin: Skin is warm and dry. No rash noted.  Small patch of scaly skin on right cheek area, right anterior knee.  Few scattered nevi  Psychiatric: He has a normal mood and affect. His behavior is normal.  Vitals reviewed.  BP 126/70 (BP Location: Left Arm, Patient Position: Sitting, Cuff Size: Small)   Pulse 88   Temp 97.9 F (36.6 C) (Oral)   Resp 18   Ht 5' 5.25" (1.657 m)   Wt 191 lb (86.6 kg)   SpO2 96%   BMI 31.54 kg/m    Assessment & Plan:  [name] is a 55 y.o. male Annual physical exam  --anticipatory guidance as below in AVS, screening labs above. Health maintenance items as above in HPI discussed/recommended as applicable.   Need for prophylactic vaccination and inoculation against influenza - Plan: Flu Vaccine QUAD 36+ mos IM given  Type 2 diabetes mellitus without complication, without  long-term current use of insulin (HCC) - Plan: Microalbumin, urine, Hemoglobin A1c  - Previously controlled. Check A1c, urine microalbumin, no change in meds for now.  Essential hypertension - Plan: COMPLETE METABOLIC PANEL WITH GFR  -Stable, no change in lisinopril HCTZ dosing, tolerating without new side effects.  Elevated LFTs - Plan: COMPLETE METABOLIC PANEL WITH GFR  - Previous workup with ultrasound, right upper quadrant without concerning findings, hep C testing, somewhat improved last visit off of Lipitor. Continue to hold statin, repeat LFTs, and if still elevated can have gastroenterology eval to look into other workup.  -Will check lipids, and if we decide to restart statin, would want to have LFTs rechecked within a few weeks of restarting statin to make sure that does not cause further increase.  Screening for prostate cancer - Plan: PSA  - We discussed pros and cons of prostate cancer screening, and after this discussion, he chose to have screening done. PSA obtained, and no concerning findings on DRE.   Hyperlipidemia, unspecified hyperlipidemia type - Plan: Lipid panel  - Repeat lipids, determine wrist with HA calculator, but if considering restarting statin, close follow-up of LFTs  Screening for thyroid disorder - Plan: TSH  -History of thyromegaly, last TSH normal. Repeat TSH  Excess cerumen in ear canal  -right greater than left, no obstruction at present. Avoid Q-tips, RTC precautions if decreased hearing and consider lavage in office.  No orders of the defined types were placed in this encounter.  Patient Instructions   For diabetes, no change in medications for now, I will check your A1c and urine test for protein. For blood pressure, no change in medications. I will check some lab work. Prostate cancer test was checked, keep follow-up with dermatologist as planned, and colonoscopy in 2019. Let me know in January if you need a referral to the genetic counselor to  discuss if other testing is needed with your family history of the BRCA2 gene If your liver tests are elevated again, I will likely refer you to a gastroenterologist. We can continue to hold off on Lipitor for now until I see both your cholesterol and liver tests. If we decide to  restart the Lipitor, I would want to see you back just for labs only to repeat your liver tests on Lipitor within a few weeks. Again, we can decide this once we see cholesterol result  Keeping you healthy  Get these tests  Blood pressure- Have your blood pressure checked once a year by your healthcare provider.  Normal blood pressure is 120/80  Weight- Have your body mass index (BMI) calculated to screen for obesity.  BMI is a measure of body fat based on height and weight. You can also calculate your own BMI at [email or web address].[email or web address].  Cholesterol- Have your cholesterol checked every year.  Diabetes- Have your blood sugar checked regularly if you have high blood pressure, high cholesterol, have a family history of diabetes or if you are overweight.  Screening for Colon Cancer- Colonoscopy starting at age 76.  Screening may begin sooner depending on your family history and other health conditions. Follow up colonoscopy as directed by your Gastroenterologist.  Screening for Prostate Cancer- Both blood work (PSA) and a rectal exam help screen for Prostate Cancer.  Screening begins at age 68 with African-American men and at age 59 with Caucasian men.  Screening may begin sooner depending on your family history.  Take these medicines  Aspirin- One aspirin daily can help prevent Heart disease and Stroke.  Flu shot- Every fall.  Tetanus- Every 10 years.  Zostavax- Once after the age of 37 to prevent Shingles.  Pneumonia shot- Once after the age of 72; if you are younger than 71, ask your healthcare provider if you need a Pneumonia shot.  Take these steps  Don't smoke- If you do smoke, talk to your doctor about  quitting.  For tips on how to quit, go to www.smokefree.gov or call 1-800-QUIT-NOW.  Be physically active- Exercise 5 days a week for at least 30 minutes.  If you are not already physically active start slow and gradually work up to 30 minutes of moderate physical activity.  Examples of moderate activity include walking briskly, mowing the yard, dancing, swimming, bicycling, etc.  Eat a healthy diet- Eat a variety of healthy food such as fruits, vegetables, low fat milk, low fat cheese, yogurt, lean meant, poultry, fish, beans, tofu, etc. For more information go to www.thenutritionsource.org  Drink alcohol in moderation- Limit alcohol intake to less than two drinks a day. Never drink and drive.  Dentist- Brush and floss twice daily; visit your dentist twice a year.  Depression- Your emotional health is as important as your physical health. If you're feeling down, or losing interest in things you would normally enjoy please talk to your healthcare provider.  Eye exam- Visit your eye doctor every year.  Safe sex- If you may be exposed to a sexually transmitted infection, use a condom.  Seat belts- Seat belts can save your life; always wear one.  Smoke/Carbon Monoxide detectors- These detectors need to be installed on the appropriate level of your home.  Replace batteries at least once a year.  Skin cancer- When out in the sun, cover up and use sunscreen 15 SPF or higher.  Violence- If anyone is threatening you, please tell your healthcare provider.  Living Will/ Health care power of attorney- Speak with your healthcare provider and family.   IF you received an x-ray today, you will receive an invoice from [facility]. Please contact [facility] at [phone] with questions or concerns regarding your invoice.   IF you received labwork today, you will receive an invoice from [facility]. Please contact [facility] at [phone] with questions or  concerns regarding your invoice.   Our billing staff will not be able to assist you with questions regarding bills from these companies.  You will be contacted with the lab results as soon as they are available. The fastest way to get your results is to activate your My Chart account. Instructions are located on the last page of this paperwork. If you have not heard from [place] regarding the results in 2 weeks, please contact this office.       I personally performed the services described in this documentation, which was scribed in my presence. The recorded information has been reviewed and considered, and addended by me as needed.   Signed,   Merri Ray, MD [facility].  10/18/16 9:35 AM

## 2016-10-26 NOTE — Addendum Note (Signed)
Addended by: Merri Ray R on: 10/26/2016 01:09 PM   Modules accepted: Orders

## 2016-10-29 ENCOUNTER — Encounter: Payer: Self-pay | Admitting: Family Medicine

## 2016-11-02 NOTE — Telephone Encounter (Signed)
Dr Vonna Kotyk instr's are copied here: If your liver tests are elevated again, I will likely refer you to a gastroenterologist. We can continue to hold off on Lipitor for now until I see both your cholesterol and liver tests. If we decide to restart the Lipitor, I would want to see you back just for labs only to repeat your liver tests on Lipitor within a few weeks.  Had clerical cancel pt's Jan 4 appt and notified pt on MyChart.

## 2016-11-07 ENCOUNTER — Encounter: Payer: Self-pay | Admitting: Genetic Counselor

## 2016-11-07 ENCOUNTER — Telehealth: Payer: Self-pay | Admitting: Genetic Counselor

## 2016-11-07 NOTE — Telephone Encounter (Signed)
Pt confirmed appt, verified demo and insurance, mailed pt letter, in basket referring provider appt.

## 2016-11-16 ENCOUNTER — Other Ambulatory Visit: Payer: Self-pay | Admitting: Family Medicine

## 2016-11-16 DIAGNOSIS — E119 Type 2 diabetes mellitus without complications: Secondary | ICD-10-CM

## 2016-11-17 NOTE — Telephone Encounter (Signed)
10/18/16 patient was stable on meds, advised to continue [place] to refill per protocol

## 2016-11-21 ENCOUNTER — Other Ambulatory Visit: Payer: Self-pay | Admitting: Family Medicine

## 2016-11-21 DIAGNOSIS — E785 Hyperlipidemia, unspecified: Secondary | ICD-10-CM

## 2016-12-06 ENCOUNTER — Other Ambulatory Visit: Payer: BLUE CROSS/BLUE SHIELD

## 2016-12-06 DIAGNOSIS — R945 Abnormal results of liver function studies: Principal | ICD-10-CM

## 2016-12-06 DIAGNOSIS — R7989 Other specified abnormal findings of blood chemistry: Secondary | ICD-10-CM

## 2016-12-07 LAB — HEPATIC FUNCTION PANEL
ALK PHOS: 87 IU/L (ref 39–117)
ALT: 56 IU/L — AB (ref 0–44)
AST: 36 IU/L (ref 0–40)
Albumin: 4.5 g/dL (ref 3.5–5.5)
Bilirubin Total: 0.3 mg/dL (ref 0.0–1.2)
Bilirubin, Direct: 0.1 mg/dL (ref 0.00–0.40)
Total Protein: 6.9 g/dL (ref 6.0–8.5)

## 2016-12-10 ENCOUNTER — Other Ambulatory Visit: Payer: Self-pay | Admitting: Family Medicine

## 2016-12-13 ENCOUNTER — Ambulatory Visit: Payer: BLUE CROSS/BLUE SHIELD | Admitting: Family Medicine

## 2016-12-24 ENCOUNTER — Encounter: Payer: Self-pay | Admitting: Genetic Counselor

## 2016-12-24 ENCOUNTER — Ambulatory Visit (HOSPITAL_BASED_OUTPATIENT_CLINIC_OR_DEPARTMENT_OTHER): Payer: BLUE CROSS/BLUE SHIELD | Admitting: Genetic Counselor

## 2016-12-24 ENCOUNTER — Other Ambulatory Visit: Payer: BLUE CROSS/BLUE SHIELD

## 2016-12-24 DIAGNOSIS — Z315 Encounter for genetic counseling: Secondary | ICD-10-CM

## 2016-12-24 DIAGNOSIS — Z8041 Family history of malignant neoplasm of ovary: Secondary | ICD-10-CM

## 2016-12-24 DIAGNOSIS — Z8481 Family history of carrier of genetic disease: Secondary | ICD-10-CM | POA: Insufficient documentation

## 2016-12-24 DIAGNOSIS — Z8042 Family history of malignant neoplasm of prostate: Secondary | ICD-10-CM | POA: Insufficient documentation

## 2016-12-24 NOTE — Progress Notes (Signed)
REFERRING PROVIDER: [name], MD [address] [place], [place] [address]  PRIMARY PROVIDER:  [name], MD  PRIMARY REASON FOR VISIT:  1. Family history of ovarian cancer   2. Family history of prostate cancer   3. Family history of BRCA2 gene positive      HISTORY OF PRESENT ILLNESS:   [name], a 56 y.o. male, was seen for a [place] cancer genetics consultation at the request of Dr. Carlota Raspberry due to a family history of cancer.  [name] presents to clinic today to discuss the possibility of a hereditary predisposition to cancer, genetic testing, and to further clarify his future cancer risks, as well as potential cancer risks for family members.  [name] is a 56 y.o. male with no personal history of cancer.  His sister was diagnosed with ovarian cancer and was found to have a BRCA2 mutation.  Another sister was tested and was found to be negative.  [name] is seen today to undergo genetic testing for the familial BRCA2 mutation.  CANCER HISTORY:   No history exists.      Past Medical History:  Diagnosis Date  . Diabetes mellitus 2008  . Family history of BRCA2 gene positive   . Family history of ovarian cancer   . Family history of prostate cancer   . Fatty liver 2008   Fatty liver, saw GI    . Hyperlipidemia   . Hypertension     Past Surgical History:  Procedure Laterality Date  . NO PAST SURGERIES      Social History   Social History  . Marital status: Single    Spouse name: N/A  . Number of children: 0  . Years of education: N/A   Occupational History  . [employer]   .  [employer]   Social History Main Topics  . Smoking status: Never Smoker  . Smokeless tobacco: Never Used  . Alcohol use No  . Drug use: No  . Sexual activity: Not Asked   Other Topics Concern  . None   Social History Narrative   Lives by himself                  FAMILY HISTORY:  We obtained a detailed, 4-generation family history.  Significant diagnoses are  listed below: Family History  Problem Relation Age of Onset  . Diabetes Father   . Lung cancer Father     smoker  . Stroke Mother   . Ovarian cancer Sister 75  . BRCA 1/2 Sister     BRCA2 pos  . Leukemia Maternal Aunt   . Stomach cancer Maternal Grandfather   . Prostate cancer Paternal Grandfather   . Melanoma Sister   . BRCA 1/2 Sister     BRCA2 negative  . Leukemia Cousin     maternal first cousin  . Heart attack Neg Hx   . Colon cancer Neg Hx     Mr. Plancarte does not have children.  He has three sisters.  One sister was diagnosed with melanoma, another sister died from ovarian cancer and was found to have a BRCA2 mutation, and a third sister is healthy and underwent genetic testing and was found to be negative for the known family mutation ([name]) in  BRCA2 mutation.  The patient's mother died at 84 from a stroke, and his father died at 45 from lung cancer.  His father has one sister who is alive and never had cancer.  His mother  died of a heart attack and his father died from prostate cancer.  The patient's mother had two brothers and three sisters.  One sister died at 71 from leukemia and a brother had a son who died from leukemia.  The patient's maternal grandfather had stomach cancer.  There is no other reported family history of cancer.   Patient's maternal ancestors are of [place] descent, and paternal ancestors are of [place] and [place] descent. There is no reported Ashkenazi Jewish ancestry. There is no known consanguinity.  GENETIC COUNSELING ASSESSMENT: [name] is a 56 y.o. male with a family history of cancer and a known family mutation in BRCA2 which is somewhat suggestive of a hereditary cancer syndrome and predisposition to cancer. We, therefore, discussed and recommended the following at today's visit.   DISCUSSION: We discussed that about 20% of ovarian cancer is due to hereditary causes, most commonly BRCA mutations.  The patient's sister was found to have a  BRCA2 mutation.  Based on the family history it appears that it could be coming from his father's side of the family.  We discussed that letting extended family members know of the test result would be important so that others may put in place preventive measure if they are positive.    Based on his sister's testing, [name] is at 50% risk of also having the [facility] in BRCA2.  We discussed that if he were positive we would recommend performing self breast exams, make sure that he has his prostate cancer screening on time, and have yearly skin exams for melanoma.  We reviewed the characteristics, features and inheritance patterns of hereditary cancer syndromes. We also discussed genetic testing, including the appropriate family members to test, the process of testing, insurance coverage and turn-around-time for results. We discussed the implications of a negative, positive and/or variant of uncertain significant result. We recommended [name] pursue genetic testing for the [facility] in BRCA2.     Based on [name] family history of cancer, he meets medical criteria for genetic testing. Despite that he meets criteria, he may still have an out of pocket cost. We discussed that if his out of pocket cost for testing is over $100, the laboratory will call and confirm whether he wants to proceed with testing.  If the out of pocket cost of testing is less than $100 he will be billed by the genetic testing laboratory.   PLAN: After considering the risks, benefits, and limitations, [name]  provided informed consent to pursue genetic testing and the blood sample was sent to [facility] for analysis of the [facility] in BRCA2. Results should be available within approximately 2-3 weeks' time, at which point they will be disclosed by telephone to [name], as will any additional recommendations warranted by these results. [name] will receive a summary of his genetic counseling visit and a copy of his results once available.  This information will also be available in Epic. We encouraged [name] to remain in contact with cancer genetics annually so that we can continuously update the family history and inform him of any changes in cancer genetics and testing that may be of benefit for his family. Mr. Damore questions were answered to his satisfaction today. Our contact information was provided should additional questions or concerns arise.  Lastly, we encouraged Mr. Lacount to remain in contact with cancer genetics annually so that we can continuously update the family history and inform him of any changes in cancer genetics  and testing that may be of benefit for this family.   Mr.  Dumlao questions were answered to his satisfaction today. Our contact information was provided should additional questions or concerns arise. Thank you for the referral and allowing [place] to share in the care of your patient.   Karen P. Florene Glen, [place], [facility] Certified Genetic Counselor [email or web address] phone: [phone]  The patient was seen for a total of 30 minutes in face-to-face genetic counseling.  This patient was discussed with Drs. Magrinat, Lindi Adie and/or Burr Medico who agrees with the above.    _______________________________________________________________________ For Office Staff:  Number of people involved in session: 1 Was an Intern/ student involved with case: no

## 2017-01-07 ENCOUNTER — Encounter: Payer: Self-pay | Admitting: Genetic Counselor

## 2017-01-07 DIAGNOSIS — Z1501 Genetic susceptibility to malignant neoplasm of breast: Secondary | ICD-10-CM | POA: Insufficient documentation

## 2017-01-07 DIAGNOSIS — Z1379 Encounter for other screening for genetic and chromosomal anomalies: Secondary | ICD-10-CM | POA: Insufficient documentation

## 2017-01-07 DIAGNOSIS — Z1509 Genetic susceptibility to other malignant neoplasm: Secondary | ICD-10-CM

## 2017-01-10 ENCOUNTER — Telehealth: Payer: Self-pay | Admitting: Genetic Counselor

## 2017-01-10 NOTE — Telephone Encounter (Signed)
Patient asked that I call him at work.  I LM with co worker that I had called and to please call me back.  Did not state where I was calling from.

## 2017-01-14 ENCOUNTER — Encounter: Payer: BLUE CROSS/BLUE SHIELD | Admitting: Genetic Counselor

## 2017-01-17 ENCOUNTER — Encounter: Payer: Self-pay | Admitting: Family Medicine

## 2017-01-17 ENCOUNTER — Ambulatory Visit (INDEPENDENT_AMBULATORY_CARE_PROVIDER_SITE_OTHER): Payer: BLUE CROSS/BLUE SHIELD | Admitting: Family Medicine

## 2017-01-17 VITALS — BP 126/84 | HR 89 | Temp 97.8°F | Resp 18 | Ht 65.25 in | Wt 193.0 lb

## 2017-01-17 DIAGNOSIS — E119 Type 2 diabetes mellitus without complications: Secondary | ICD-10-CM | POA: Diagnosis not present

## 2017-01-17 DIAGNOSIS — E785 Hyperlipidemia, unspecified: Secondary | ICD-10-CM | POA: Diagnosis not present

## 2017-01-17 DIAGNOSIS — I10 Essential (primary) hypertension: Secondary | ICD-10-CM

## 2017-01-17 DIAGNOSIS — R7989 Other specified abnormal findings of blood chemistry: Secondary | ICD-10-CM | POA: Diagnosis not present

## 2017-01-17 DIAGNOSIS — R945 Abnormal results of liver function studies: Secondary | ICD-10-CM

## 2017-01-17 MED ORDER — LISINOPRIL-HYDROCHLOROTHIAZIDE 10-12.5 MG PO TABS: 1.0000 | ORAL_TABLET | Freq: Every day | ORAL | 1 refills | Status: DC

## 2017-01-17 MED ORDER — ATORVASTATIN CALCIUM 10 MG PO TABS: 10.0000 mg | ORAL_TABLET | Freq: Every day | ORAL | 1 refills | Status: DC

## 2017-01-17 MED ORDER — METFORMIN HCL 500 MG PO TABS: 500.0000 mg | ORAL_TABLET | Freq: Two times a day (BID) | ORAL | 1 refills | Status: DC

## 2017-01-17 NOTE — Patient Instructions (Addendum)
  Restart Lipitor, return for lab only visit in 4-6 weeks. We can determine the control of cholesterol as well as liver tests at that time. If the liver tests significantly increase, it may be due to your cholesterol medication. We will also check your diabetes test at that time.   Let me know if the genetic counselor recommends any other specific testing or change in interval of cancer screening. This can also be discussed with oncology if needed.  Plan on lab visit in the next 4-6 weeks, then office visit in 6 months. Sooner if needed.    IF you received an x-ray today, you will receive an invoice from [facility]. Please contact [facility] at [phone] with questions or concerns regarding your invoice.   IF you received labwork today, you will receive an invoice from [place]. Please contact [facility] at [phone] with questions or concerns regarding your invoice.   Our billing staff will not be able to assist you with questions regarding bills from these companies.  You will be contacted with the lab results as soon as they are available. The fastest way to get your results is to activate your My Chart account. Instructions are located on the last page of this paperwork. If you have not heard from [place] regarding the results in 2 weeks, please contact this office.

## 2017-01-17 NOTE — Progress Notes (Signed)
By signing my name below, I, Mesha Guinyard, attest that this documentation has been prepared under the direction and in the presence of Merri Ray, MD.  Electronically Signed: Verlee Monte, Medical Scribe. 01/17/17. 9:43 AM.  Subjective:    Patient ID: [name], male    DOB: [birth year], 56 y.o.   MRN: [id number]  HPI Chief Complaint  Patient presents with  . Follow-up    FROM PHYSICAL    HPI Comments: [name] is a 56 y.o. male who presents to the [facility] for follow-up. He was last seen Dec 28th.  Elevated LFTs: Levels were nl 3 months ago (off statin then), elevated 6 months ago. He had a reassuring ultrasound as well as a negative Hep B and C in the past. Lab Results  Component Value Date   ALT 56 (H) 12/06/2016   AST 36 12/06/2016   ALKPHOS 87 12/06/2016   BILITOT 0.3 12/06/2016   HLD: Recommended restart of lipitor 10 mg QD in Dec. Pt ran out of lipitor 2-3 weeks ago and couldn't get a refill. Denies myalgias, abdominal pain, nausea, or other negative side effects when he was taking lipitor. Lab Results  Component Value Date   CHOL 212 (H) 10/18/2016   HDL 41 10/18/2016   LDLCALC 129 (H) 10/18/2016   LDLDIRECT 58.4 06/15/2011   TRIG 208 (H) 10/18/2016   CHOLHDL 5.2 (H) 10/18/2016   Diabetes: Continued on metformin. Compliant with metformin BID. Denies diarrhea, abdominal pain or other negative side effects from his medication. Lab Results  Component Value Date   HGBA1C 5.9 (H) 10/18/2016   HTN: Controlled last visit. BP has been ranging 120-125/80-85. Lab Results  Component Value Date   CREATININE 0.86 10/18/2016   Skin on face: Pt was told he had sun damage and was given efudex cream BID Jan 29th to take for 3 weeks and last week he received mometasone cream BID PRN for the burning side effects of efudex. Pt has also been using a moisturizing cream with sun screen. Pt is followed by a dermatologist with an annual  appt.  CA screening: Pt had genetic CA screening done and he tested positive for BRCA2 gene. Pt has an upcoming appt with them. Denies noticing new breast nodules/masses. No known change in cancer screening recommendations.   Patient Active Problem List   Diagnosis Date Noted  . Genetic testing 01/07/2017  . BRCA2 positive 01/07/2017  . Family history of ovarian cancer   . Family history of prostate cancer   . Family history of BRCA2 gene positive   . Elevated LFTs 09/20/2013  . Annual physical exam 04/21/2012  . Thyromegaly 04/21/2012  . SKIN LESION 01/13/2010  . Diabetes type 2, controlled ([place]) 06/12/2007  . HYPERLIPIDEMIA 01/04/2007  . Hypertension 01/04/2007   Past Medical History:  Diagnosis Date  . Diabetes mellitus 2008  . Family history of BRCA2 gene positive   . Family history of ovarian cancer   . Family history of prostate cancer   . Fatty liver 2008   Fatty liver, saw GI    . Hyperlipidemia   . Hypertension    Past Surgical History:  Procedure Laterality Date  . NO PAST SURGERIES     No Known Allergies Prior to Admission medications   Medication Sig Start Date End Date Taking? Authorizing Provider  aspirin 325 MG tablet Take 325 mg by mouth daily.     Yes Historical Provider, MD  atorvastatin (LIPITOR) 10 MG  tablet TAKE 1 TABLET AT BEDTIME 07/09/16  Yes [name], MD  blood glucose meter kit and supplies Dispense based on patient and insurance preference. Use up to four times daily as directed. (FOR ICD-9 250.00, 250.01). 07/04/15  Yes [name], MD  fish oil-omega-3 fatty acids 1000 MG capsule Take 4 g by mouth daily.    Yes Historical Provider, MD  glucose blood test strip Use to test blood sugar up to 4 times daily as directed. Dx code: E11.9 07/07/15  Yes [name], MD  lisinopril-hydrochlorothiazide (PRINZIDE,ZESTORETIC) 10-12.5 MG tablet TAKE 1 TABLET DAILY 12/10/16  Yes [name], MD  metFORMIN (GLUCOPHAGE) 500 MG tablet TAKE 1  TABLET TWICE A DAY WITH MEALS 11/17/16  Yes [name], MD  multivitamin-iron-minerals-folic acid (CENTRUM) chewable tablet Chew 1 tablet by mouth daily.     Yes Historical Provider, MD   Social History   Social History  . Marital status: Single    Spouse name: N/A  . Number of children: 0  . Years of education: N/A   Occupational History  . [employer]   .  [employer]   Social History Main Topics  . Smoking status: Never Smoker  . Smokeless tobacco: Never Used  . Alcohol use No  . Drug use: No  . Sexual activity: Not on file   Other Topics Concern  . Not on file   Social History Narrative   Lives by himself                Review of Systems  Gastrointestinal: Negative for abdominal pain, diarrhea and nausea.  Musculoskeletal: Negative for myalgias.  Skin: Positive for color change (side effect from dermatology medication). Negative for rash.   Objective:  Physical Exam  Constitutional: He is oriented to person, place, and time. He appears well-developed and well-nourished.  HENT:  Head: Normocephalic and atraumatic.  Eyes: EOM are normal. Pupils are equal, round, and reactive to light.  Neck: No JVD present. Carotid bruit is not present.  Cardiovascular: Normal rate, regular rhythm and normal heart sounds.  Exam reveals no friction rub.   No murmur heard. Pulmonary/Chest: Effort normal and breath sounds normal. No respiratory distress. He has no wheezes. He has no rales.  Abdominal: Soft. There is no hepatomegaly (liver non palpable). There is no tenderness. There is no guarding.  Musculoskeletal: He exhibits no edema.  Neurological: He is alert and oriented to person, place, and time.  Skin: Skin is warm and dry.  Psychiatric: He has a normal mood and affect.  Vitals reviewed.  BP 126/84 (BP Location: Right Arm, Patient Position: Sitting, Cuff Size: Small)   Pulse 89   Temp 97.8 F (36.6 C) (Oral)   Resp 18   Ht 5' 5.25" (1.657 m)   Wt 193 lb (87.5 kg)    SpO2 95%   BMI 31.87 kg/m  Assessment & Plan:   [name] is a 56 y.o. male Hyperlipidemia, unspecified hyperlipidemia type - Plan: atorvastatin (LIPITOR) 10 MG tablet, Lipid panel, Comprehensive metabolic panel  - Off Lipitor today. Restart Lipitor, repeat lipid and liver testing in approximately 6 weeks. If elevated LFTs, consider statin as cause. Previous workup was reassuring.  Controlled type 2 diabetes mellitus without complication, without long-term current use of insulin ([place]) - Plan: metFORMIN (GLUCOPHAGE) 500 MG tablet, Hemoglobin A1C  - As the control. Continue metformin 500 mg twice a day, lab visit in 6 weeks for A1c and other lab  work.  recheck in 6 months  Essential hypertension - Plan: lisinopril-hydrochlorothiazide (PRINZIDE,ZESTORETIC) 10-12.5 MG tablet, Comprehensive metabolic panel  - stable, continue same meds, plan on labs in 6 weeks.  Elevated LFTs - Plan: Comprehensive metabolic panel  - mild elevation of one LFT last visit. Possibly due to statin, but will check again once he restarts his statin. Previous workup reassuring  Meds ordered this encounter  Medications  . metFORMIN (GLUCOPHAGE) 500 MG tablet    Sig: Take 1 tablet (500 mg total) by mouth 2 (two) times daily with a meal.    Dispense:  180 tablet    Refill:  1  . lisinopril-hydrochlorothiazide (PRINZIDE,ZESTORETIC) 10-12.5 MG tablet    Sig: Take 1 tablet by mouth daily.    Dispense:  90 tablet    Refill:  1  . atorvastatin (LIPITOR) 10 MG tablet    Sig: Take 1 tablet (10 mg total) by mouth at bedtime.    Dispense:  90 tablet    Refill:  1   Patient Instructions    Restart Lipitor, return for lab only visit in 4-6 weeks. We can determine the control of cholesterol as well as liver tests at that time. If the liver tests significantly increase, it may be due to your cholesterol medication. We will also check your diabetes test at that time.   Let me know if the genetic counselor  recommends any other specific testing or change in interval of cancer screening. This can also be discussed with oncology if needed.  Plan on lab visit in the next 4-6 weeks, then office visit in 6 months. Sooner if needed.    IF you received an x-ray today, you will receive an invoice from [facility]. Please contact [facility] at [phone] with questions or concerns regarding your invoice.   IF you received labwork today, you will receive an invoice from [place]. Please contact [facility] at [phone] with questions or concerns regarding your invoice.   Our billing staff will not be able to assist you with questions regarding bills from these companies.  You will be contacted with the lab results as soon as they are available. The fastest way to get your results is to activate your My Chart account. Instructions are located on the last page of this paperwork. If you have not heard from [place] regarding the results in 2 weeks, please contact this office.      I personally performed the services described in this documentation, which was scribed in my presence. The recorded information has been reviewed and considered for accuracy and completeness, addended by me as needed, and agree with information above.  Signed,   Merri Ray, MD [facility] [place].  01/17/17 9:59 AM

## 2017-01-18 ENCOUNTER — Encounter: Payer: Self-pay | Admitting: Genetic Counselor

## 2017-01-18 ENCOUNTER — Ambulatory Visit (HOSPITAL_BASED_OUTPATIENT_CLINIC_OR_DEPARTMENT_OTHER): Payer: BLUE CROSS/BLUE SHIELD | Admitting: Genetic Counselor

## 2017-01-18 DIAGNOSIS — Z1379 Encounter for other screening for genetic and chromosomal anomalies: Secondary | ICD-10-CM

## 2017-01-18 DIAGNOSIS — Z8041 Family history of malignant neoplasm of ovary: Secondary | ICD-10-CM | POA: Diagnosis not present

## 2017-01-18 DIAGNOSIS — Z800 Family history of malignant neoplasm of digestive organs: Secondary | ICD-10-CM

## 2017-01-18 DIAGNOSIS — Z801 Family history of malignant neoplasm of trachea, bronchus and lung: Secondary | ICD-10-CM

## 2017-01-18 DIAGNOSIS — Z1509 Genetic susceptibility to other malignant neoplasm: Principal | ICD-10-CM

## 2017-01-18 DIAGNOSIS — Z1501 Genetic susceptibility to malignant neoplasm of breast: Secondary | ICD-10-CM

## 2017-01-18 DIAGNOSIS — Z8042 Family history of malignant neoplasm of prostate: Secondary | ICD-10-CM | POA: Diagnosis not present

## 2017-01-18 DIAGNOSIS — Z315 Encounter for genetic counseling: Secondary | ICD-10-CM

## 2017-01-18 DIAGNOSIS — Z8481 Family history of carrier of genetic disease: Secondary | ICD-10-CM

## 2017-01-18 DIAGNOSIS — Z85820 Personal history of malignant melanoma of skin: Secondary | ICD-10-CM

## 2017-01-18 NOTE — Progress Notes (Signed)
GENETIC TEST RESULTS   Patient Name: [name] Patient Age: 56 y.o. Encounter Date: 01/18/2017  Referring Provider: Ranell Patrick. Carlota Raspberry, MD  Nena Polio, MD    Mr. Sullenger was seen in the [place] clinic on January 18, 2017 due to a family history of cancer, known familial mutation and concern regarding a hereditary predisposition to cancer in the family. Please refer to the prior Genetics clinic note for more information regarding Mr. Hohn medical and family histories and our assessment at the time.   FAMILY HISTORY:  We obtained a detailed, 4-generation family history.  Significant diagnoses are listed below: Family History  Problem Relation Age of Onset  . Diabetes Father   . Lung cancer Father     smoker  . Stroke Mother   . Ovarian cancer Sister 50  . BRCA 1/2 Sister     BRCA2 pos  . Leukemia Maternal Aunt   . Stomach cancer Maternal Grandfather   . Prostate cancer Paternal Grandfather   . Melanoma Sister   . BRCA 1/2 Sister     BRCA2 negative  . Leukemia Cousin     maternal first cousin  . Heart attack Neg Hx   . Colon cancer Neg Hx     Mr. Kiehn does not have children.  He has three sisters.  One sister was diagnosed with melanoma, another sister died from ovarian cancer and was found to have a BRCA2 mutation, and a third sister is healthy and underwent genetic testing and was found to be negative for the known family mutation ([name]) in  BRCA2 mutation.  The patient's mother died at 23 from a stroke, and his father died at 81 from lung cancer.  His father has one sister who is alive and never had cancer.  His mother died of a heart attack and his father died from prostate cancer.  The patient's mother had two brothers and three sisters.  One sister died at 82 from leukemia and a brother had a son who died from leukemia.  The patient's maternal grandfather had stomach cancer.  There is no other reported family history of cancer.  Patient's maternal  ancestors are of [place] descent, and paternal ancestors are of [place] and [place] descent. There is no reported Ashkenazi Jewish ancestry. There is no known consanguinity.  GENETIC TESTING:  At the time of Mr. Walling visit, we recommended he pursue genetic testing for the specific BRCA2 mutation that was identified in the family. The genetic testing January 04, 2017 through the BRCA2 targeted variant test offered by [facility] identified a single, heterozygous pathogenic gene mutation called BRCA2, [id number] ([id number]).   RISK REDUCTION: For men who harbor BRCA mutations, the general risk for cancer seems to be only slightly increased. We recommend that your physician perform a clinical breast exam yearly. Alternatively, you can be seen in the [facility]. A referral from a primary care physician to the cancer center will be needed.  Finally, if a breast mass is noticed, Mr. Hedding should be sure and have a physician evaluate it.   With regard to screening for other types of cancer, we estimate Mr. Ullom risk for prostate cancer to be increased over that of the general population risk. Therefore, prostate cancer screening should begin at age 57 for both PSA and digital rectal exams (DRE).  Lastly, we suggest adhering faithfully to the general population screening recommendations including annual colon cancer screening starting at the age of 45.  FAMILY MEMBERS: It is important that all of Mr. Kushnir relatives (both men and women) know of the presence of this gene mutation. Site-specific genetic testing can sort out who in the family is at risk and who is not.   Mr. Himebaugh siblings have a 50% chance to have inherited this mutation. We recommend they have genetic testing for this same mutation, as identifying the presence of this mutation would allow them to also take advantage of risk-reducing measures.   We discussed letting more extended relatives know about the risk for  inheriting the BRCA mutation.  There is a lack of family history of cancer, although Mr. Goedken paternal grandfather had prostate cancer, so the BRCA2 mutation could be coming from the paternal side of the family.  Therefore, letting his paternal male cousin who has two daughters, know about this mutation as well as his maternal cousins could help identify family members who are at risk for developing BRCA related cancers.  SUPPORT AND RESOURCES: If Mr. Manon is interested in BRCA-specific information and support, there are two groups, Facing Our Risk (www.facingourrisk.com) and Bright Pink (www.brightpink.org) which some people have found useful. They provide opportunities to speak with other individuals from high-risk families. To locate genetic counselors in other cities, visit the website of the [employer] (ArtistMovie.se) and Secretary/administrator for a Social worker by zip code.  I also provided information about the [facility] cancer registry from [facility].  We discussed that this is a registry where researchers go to identify patients who qualify for research they are interested in. Additionally, this registry sends bi-annual newsletters with updates about hereditary cancer syndrome genes.  This is a good way to passively get updated on cancer genetics.  We encouraged Mr. Leino to remain in contact with [place] on an annual basis so we can update his personal and family histories, and let him know of advances in cancer genetics that may benefit the family. Our contact number was provided. Mr. Weekley questions were answered to his satisfaction today, and he knows he is welcome to call anytime with additional questions.   Karen P. Florene Glen, [place], [facility] Certified Genetic Counselor [email or web address] phone: [phone]

## 2017-01-24 ENCOUNTER — Ambulatory Visit: Payer: BLUE CROSS/BLUE SHIELD | Admitting: Family Medicine

## 2017-03-01 ENCOUNTER — Other Ambulatory Visit: Payer: BLUE CROSS/BLUE SHIELD

## 2017-03-01 DIAGNOSIS — E785 Hyperlipidemia, unspecified: Secondary | ICD-10-CM

## 2017-03-01 DIAGNOSIS — R7989 Other specified abnormal findings of blood chemistry: Secondary | ICD-10-CM

## 2017-03-01 DIAGNOSIS — I10 Essential (primary) hypertension: Secondary | ICD-10-CM

## 2017-03-01 DIAGNOSIS — R945 Abnormal results of liver function studies: Secondary | ICD-10-CM

## 2017-03-02 LAB — COMPREHENSIVE METABOLIC PANEL
A/G RATIO: 2.1 (ref 1.2–2.2)
ALBUMIN: 4.8 g/dL (ref 3.5–5.5)
ALK PHOS: 78 IU/L (ref 39–117)
ALT: 79 IU/L — ABNORMAL HIGH (ref 0–44)
AST: 57 IU/L — ABNORMAL HIGH (ref 0–40)
BILIRUBIN TOTAL: 0.5 mg/dL (ref 0.0–1.2)
BUN / CREAT RATIO: 14 (ref 9–20)
BUN: 13 mg/dL (ref 6–24)
CHLORIDE: 99 mmol/L (ref 96–106)
CO2: 29 mmol/L (ref 18–29)
Calcium: 10.1 mg/dL (ref 8.7–10.2)
Creatinine, Ser: 0.93 mg/dL (ref 0.76–1.27)
GFR calc non Af Amer: 93 mL/min/{1.73_m2} (ref >59)
GFR, EST AFRICAN AMERICAN: 107 mL/min/{1.73_m2} (ref >59)
GLUCOSE: 139 mg/dL — AB (ref 65–99)
Globulin, Total: 2.3 g/dL (ref 1.5–4.5)
POTASSIUM: 5.2 mmol/L (ref 3.5–5.2)
Sodium: 143 mmol/L (ref 134–144)
TOTAL PROTEIN: 7.1 g/dL (ref 6.0–8.5)

## 2017-03-02 LAB — LIPID PANEL
CHOL/HDL RATIO: 3.4 ratio (ref 0.0–5.0)
CHOLESTEROL TOTAL: 131 mg/dL (ref 100–199)
HDL: 39 mg/dL — AB (ref >39)
LDL Calculated: 63 mg/dL (ref 0–99)
Triglycerides: 145 mg/dL (ref 0–149)
VLDL Cholesterol Cal: 29 mg/dL (ref 5–40)

## 2017-07-16 ENCOUNTER — Other Ambulatory Visit: Payer: Self-pay | Admitting: Family Medicine

## 2017-07-16 DIAGNOSIS — I10 Essential (primary) hypertension: Secondary | ICD-10-CM

## 2017-07-16 DIAGNOSIS — E785 Hyperlipidemia, unspecified: Secondary | ICD-10-CM

## 2017-07-18 ENCOUNTER — Ambulatory Visit (INDEPENDENT_AMBULATORY_CARE_PROVIDER_SITE_OTHER): Payer: BLUE CROSS/BLUE SHIELD | Admitting: Family Medicine

## 2017-07-18 ENCOUNTER — Encounter: Payer: Self-pay | Admitting: Family Medicine

## 2017-07-18 VITALS — BP 129/82 | HR 80 | Temp 98.1°F | Resp 14 | Ht 65.0 in | Wt 192.0 lb

## 2017-07-18 DIAGNOSIS — R7989 Other specified abnormal findings of blood chemistry: Secondary | ICD-10-CM

## 2017-07-18 DIAGNOSIS — I10 Essential (primary) hypertension: Secondary | ICD-10-CM | POA: Diagnosis not present

## 2017-07-18 DIAGNOSIS — E785 Hyperlipidemia, unspecified: Secondary | ICD-10-CM | POA: Diagnosis not present

## 2017-07-18 DIAGNOSIS — E119 Type 2 diabetes mellitus without complications: Secondary | ICD-10-CM

## 2017-07-18 DIAGNOSIS — R945 Abnormal results of liver function studies: Principal | ICD-10-CM

## 2017-07-18 MED ORDER — ATORVASTATIN CALCIUM 10 MG PO TABS: 10.0000 mg | ORAL_TABLET | Freq: Every day | ORAL | 1 refills | Status: AC

## 2017-07-18 MED ORDER — LISINOPRIL-HYDROCHLOROTHIAZIDE 10-12.5 MG PO TABS: 1.0000 | ORAL_TABLET | Freq: Every day | ORAL | 1 refills | Status: DC

## 2017-07-18 MED ORDER — ATORVASTATIN CALCIUM 10 MG PO TABS: 10.0000 mg | ORAL_TABLET | Freq: Every day | ORAL | 1 refills | Status: DC

## 2017-07-18 MED ORDER — METFORMIN HCL 500 MG PO TABS: 500.0000 mg | ORAL_TABLET | Freq: Two times a day (BID) | ORAL | 1 refills | Status: DC

## 2017-07-18 MED ORDER — METFORMIN HCL 500 MG PO TABS: 500.0000 mg | ORAL_TABLET | Freq: Two times a day (BID) | ORAL | 1 refills | Status: AC

## 2017-07-18 NOTE — Patient Instructions (Addendum)
No med changes. Follow up within 6 months for a physical. Let me know if there are questions in the meantime.     IF you received an x-ray today, you will receive an invoice from [facility]. Please contact [facility] at [phone] with questions or concerns regarding your invoice.   IF you received labwork today, you will receive an invoice from [place]. Please contact [facility] at [phone] with questions or concerns regarding your invoice.   Our billing staff will not be able to assist you with questions regarding bills from these companies.  You will be contacted with the lab results as soon as they are available. The fastest way to get your results is to activate your My Chart account. Instructions are located on the last page of this paperwork. If you have not heard from [place] regarding the results in 2 weeks, please contact this office.

## 2017-07-18 NOTE — Progress Notes (Signed)
By signing my name below, I, Mesha Guinyard, attest that this documentation has been prepared under the direction and in the presence of Merri Ray, MD.  Electronically Signed: Verlee Monte, Medical Scribe. 07/18/17. 8:15 AM.  Subjective:    Patient ID: [name], male    DOB: [birth year], 56 y.o.   MRN: [id number]  HPI Chief Complaint  Patient presents with  . Medication Refill    all medications  . Follow-up    states here for checkup    HPI Comments: [name] is a 56 y.o. male who presents to Primary Care at [facility] for follow-up. Pt is fasting.  DM: He was last seen Feb 8th. He was asked to return in 4-6 weeks for lab only visit. He did have other labs done, but it appears A1c was not drawn in March.  He takes metformin 500 mg BID. Pt is compliant with metformin. Pt has seen his ophthalmologist within the past year. Denies experiencing negative side effects from metformin. Lab Results  Component Value Date   HGBA1C 5.9 (H) 10/18/2016   Lab Results  Component Value Date   MICROALBUR <0.2 10/18/2016   HTN: He's on lisinopril-HCTZ, and ASA QD. Pt is compliant with lisinopril-HCTZ and denies SOB, light-headedness, chest pain, cough, leg swelling, or other negative side effetcs. Lab Results  Component Value Date   CREATININE 0.93 03/01/2017   HLD: Borderline LFTs in the past, improving. Takes lipitor 10 mg. Pt is compliant with lipitor and denies myalgias, or other negative side effects. Lab Results  Component Value Date   CHOL 131 03/01/2017   HDL 39 (L) 03/01/2017   LDLCALC 63 03/01/2017   LDLDIRECT 58.4 06/15/2011   TRIG 145 03/01/2017   CHOLHDL 3.4 03/01/2017   Lab Results  Component Value Date   ALT 79 (H) 03/01/2017   AST 57 (H) 03/01/2017   ALKPHOS 78 03/01/2017   BILITOT 0.5 03/01/2017   Health Maintenance: Pt has follow-up appt with his dermatologist Aug 23rd. Denies jaundice, N/V , or abdominal pain. Immunization History    Administered Date(s) Administered  . Influenza Split 09/15/2012  . Influenza Whole 09/09/2009, 08/10/2010  . Influenza,inj,Quad PF,36+ Mos 09/17/2013, 09/20/2014, 10/10/2015, 10/18/2016  . Pneumococcal Conjugate-13 09/20/2014  . Pneumococcal Polysaccharide-23 10/01/2007  . Td 02/25/2004  . Tdap 03/18/2014   Patient Active Problem List   Diagnosis Date Noted  . Genetic testing 01/07/2017  . BRCA2 positive 01/07/2017  . Family history of ovarian cancer   . Family history of prostate cancer   . Family history of BRCA2 gene positive   . Elevated LFTs 09/20/2013  . Annual physical exam 04/21/2012  . Thyromegaly 04/21/2012  . SKIN LESION 01/13/2010  . Diabetes type 2, controlled ([place]) 06/12/2007  . HYPERLIPIDEMIA 01/04/2007  . Hypertension 01/04/2007   Past Medical History:  Diagnosis Date  . BRCA2 positive   . Diabetes mellitus 2008  . Family history of BRCA2 gene positive   . Family history of ovarian cancer   . Family history of prostate cancer   . Fatty liver 2008   Fatty liver, saw GI    . Hyperlipidemia   . Hypertension    Past Surgical History:  Procedure Laterality Date  . NO PAST SURGERIES     No Known Allergies Prior to Admission medications   Medication Sig Start Date End Date Taking? Authorizing Provider  aspirin 325 MG tablet Take 325 mg by mouth daily.     Yes [provider]  atorvastatin (  LIPITOR) 10 MG tablet TAKE 1 TABLET AT BEDTIME 07/16/17  Yes [name], MD  blood glucose meter kit and supplies Dispense based on patient and insurance preference. Use up to four times daily as directed. (FOR ICD-9 250.00, 250.01). 07/04/15  Yes [name], MD  fish oil-omega-3 fatty acids 1000 MG capsule Take 4 g by mouth daily.    Yes [provider]  glucose blood test strip Use to test blood sugar up to 4 times daily as directed. Dx code: E11.9 07/07/15  Yes [name], MD  lisinopril-hydrochlorothiazide (PRINZIDE,ZESTORETIC)  10-12.5 MG tablet TAKE 1 TABLET DAILY 07/16/17  Yes [name], MD  metFORMIN (GLUCOPHAGE) 500 MG tablet Take 1 tablet (500 mg total) by mouth 2 (two) times daily with a meal. 01/17/17  Yes [name], MD  multivitamin-iron-minerals-folic acid (CENTRUM) chewable tablet Chew 1 tablet by mouth daily.     Yes [provider]   Social History   Social History  . Marital status: Single    Spouse name: N/A  . Number of children: 0  . Years of education: N/A   Occupational History  . [employer]   .  [employer]   Social History Main Topics  . Smoking status: Never Smoker  . Smokeless tobacco: Never Used  . Alcohol use No  . Drug use: No  . Sexual activity: Not on file   Other Topics Concern  . Not on file   Social History Narrative   Lives by himself                Review of Systems  Constitutional: Negative for fatigue and unexpected weight change.  Eyes: Negative for visual disturbance.  Respiratory: Negative for cough, chest tightness and shortness of breath.   Cardiovascular: Negative for chest pain, palpitations and leg swelling.  Gastrointestinal: Negative for abdominal pain, blood in stool, nausea and vomiting.  Musculoskeletal: Negative for myalgias.  Skin: Negative for color change.  Neurological: Negative for dizziness, light-headedness and headaches.   Objective:  Physical Exam  Constitutional: He appears well-developed and well-nourished. No distress.  HENT:  Head: Normocephalic and atraumatic.  Eyes: Conjunctivae are normal.  Neck: Neck supple.  Cardiovascular: Normal rate, regular rhythm and normal heart sounds.  Exam reveals no gallop and no friction rub.   No murmur heard. Pulmonary/Chest: Effort normal and breath sounds normal. No respiratory distress. He has no wheezes. He has no rales.  Musculoskeletal: He exhibits no edema.  Neurological: He is alert.  Skin: Skin is warm and dry.  Psychiatric: He has a normal mood and affect. His  behavior is normal.  Nursing note and vitals reviewed.   Vitals:   07/18/17 0807  BP: 129/82  Pulse: 80  Resp: 14  Temp: 98.1 F (36.7 C)  TempSrc: Oral  SpO2: 96%  Weight: 192 lb (87.1 kg)  Height: 5' 5"  (1.651 m)  Body mass index is 31.95 kg/m. Assessment & Plan:   [name] is a 56 y.o. male LFT elevation - Plan: Comprehensive metabolic panel  - Asymptomatic. Previous improving, but he is taking a statin. Check LFTs. Avoid Tylenol, alcohol for now.  Controlled type 2 diabetes mellitus without complication, without long-term current use of insulin ([place]) - Plan: Hemoglobin A1c, metFORMIN (GLUCOPHAGE) 500 MG tablet, DISCONTINUED: metFORMIN (GLUCOPHAGE) 500 MG tablet  -Check A1c, continue metformin same dose as tolerating that medication.  Essential hypertension - Plan: Comprehensive metabolic panel, lisinopril-hydrochlorothiazide (PRINZIDE,ZESTORETIC) 10-12.5 MG tablet, DISCONTINUED:  lisinopril-hydrochlorothiazide (PRINZIDE,ZESTORETIC) 10-12.5 MG tablet  -Stable, check labs, continue same doses.  Hyperlipidemia, unspecified hyperlipidemia type - Plan: Lipid panel, atorvastatin (LIPITOR) 10 MG tablet, DISCONTINUED: atorvastatin (LIPITOR) 10 MG tablet  -Check labs, continue Lipitor same dose as tolerating  Meds ordered this encounter  Medications  . DISCONTD: metFORMIN (GLUCOPHAGE) 500 MG tablet    Sig: Take 1 tablet (500 mg total) by mouth 2 (two) times daily with a meal.    Dispense:  180 tablet    Refill:  1  . DISCONTD: lisinopril-hydrochlorothiazide (PRINZIDE,ZESTORETIC) 10-12.5 MG tablet    Sig: Take 1 tablet by mouth daily.    Dispense:  90 tablet    Refill:  1  . DISCONTD: atorvastatin (LIPITOR) 10 MG tablet    Sig: Take 1 tablet (10 mg total) by mouth at bedtime.    Dispense:  90 tablet    Refill:  1  . atorvastatin (LIPITOR) 10 MG tablet    Sig: Take 1 tablet (10 mg total) by mouth at bedtime.    Dispense:  90 tablet    Refill:  1  .  lisinopril-hydrochlorothiazide (PRINZIDE,ZESTORETIC) 10-12.5 MG tablet    Sig: Take 1 tablet by mouth daily.    Dispense:  90 tablet    Refill:  1  . metFORMIN (GLUCOPHAGE) 500 MG tablet    Sig: Take 1 tablet (500 mg total) by mouth 2 (two) times daily with a meal.    Dispense:  180 tablet    Refill:  1   Patient Instructions   No med changes. Follow up within 6 months for a physical. Let me know if there are questions in the meantime.     IF you received an x-ray today, you will receive an invoice from [facility]. Please contact [facility] at [phone] with questions or concerns regarding your invoice.   IF you received labwork today, you will receive an invoice from [place]. Please contact [facility] at [phone] with questions or concerns regarding your invoice.   Our billing staff will not be able to assist you with questions regarding bills from these companies.  You will be contacted with the lab results as soon as they are available. The fastest way to get your results is to activate your My Chart account. Instructions are located on the last page of this paperwork. If you have not heard from [place] regarding the results in 2 weeks, please contact this office.       I personally performed the services described in this documentation, which was scribed in my presence. The recorded information has been reviewed and considered for accuracy and completeness, addended by me as needed, and agree with information above.  Signed,   Merri Ray, MD Primary Care at [place].  07/19/17 10:30 AM

## 2017-07-19 LAB — COMPREHENSIVE METABOLIC PANEL
A/G RATIO: 1.9 (ref 1.2–2.2)
ALT: 71 IU/L — ABNORMAL HIGH (ref 0–44)
AST: 54 IU/L — ABNORMAL HIGH (ref 0–40)
Albumin: 4.5 g/dL (ref 3.5–5.5)
Alkaline Phosphatase: 87 IU/L (ref 39–117)
BUN/Creatinine Ratio: 13 (ref 9–20)
BUN: 10 mg/dL (ref 6–24)
Bilirubin Total: 0.6 mg/dL (ref 0.0–1.2)
CALCIUM: 10.4 mg/dL — AB (ref 8.7–10.2)
CO2: 26 mmol/L (ref 20–29)
CREATININE: 0.78 mg/dL (ref 0.76–1.27)
Chloride: 99 mmol/L (ref 96–106)
GFR, EST AFRICAN AMERICAN: 117 mL/min/{1.73_m2} (ref >59)
GFR, EST NON AFRICAN AMERICAN: 102 mL/min/{1.73_m2} (ref >59)
GLOBULIN, TOTAL: 2.4 g/dL (ref 1.5–4.5)
Glucose: 169 mg/dL — ABNORMAL HIGH (ref 65–99)
POTASSIUM: 4.8 mmol/L (ref 3.5–5.2)
SODIUM: 144 mmol/L (ref 134–144)
TOTAL PROTEIN: 6.9 g/dL (ref 6.0–8.5)

## 2017-07-19 LAB — LIPID PANEL
CHOL/HDL RATIO: 3.4 ratio (ref 0.0–5.0)
Cholesterol, Total: 118 mg/dL (ref 100–199)
HDL: 35 mg/dL — AB (ref >39)
LDL CALC: 55 mg/dL (ref 0–99)
Triglycerides: 140 mg/dL (ref 0–149)
VLDL Cholesterol Cal: 28 mg/dL (ref 5–40)

## 2017-07-19 LAB — HEMOGLOBIN A1C
Est. average glucose Bld gHb Est-mCnc: 180 mg/dL
HEMOGLOBIN A1C: 7.9 % — AB (ref 4.8–5.6)

## 2017-08-04 ENCOUNTER — Encounter: Payer: Self-pay | Admitting: Family Medicine

## 2017-08-07 ENCOUNTER — Encounter: Payer: Self-pay | Admitting: Family Medicine

## 2017-08-18 ENCOUNTER — Encounter: Payer: Self-pay | Admitting: Family Medicine

## 2017-10-14 ENCOUNTER — Other Ambulatory Visit: Payer: Self-pay | Admitting: Family Medicine

## 2017-10-14 DIAGNOSIS — E785 Hyperlipidemia, unspecified: Secondary | ICD-10-CM

## 2017-10-29 ENCOUNTER — Encounter: Payer: Self-pay | Admitting: Family Medicine

## 2017-10-29 ENCOUNTER — Emergency Department (HOSPITAL_COMMUNITY): Payer: BLUE CROSS/BLUE SHIELD

## 2017-10-29 ENCOUNTER — Other Ambulatory Visit: Payer: Self-pay

## 2017-10-29 ENCOUNTER — Inpatient Hospital Stay (HOSPITAL_COMMUNITY)
Admission: EM | Admit: 2017-10-29 | Discharge: 2017-11-06 | DRG: 982 | Disposition: A | Discharge status: Home / Self Care | Payer: BLUE CROSS/BLUE SHIELD | Attending: Internal Medicine | Admitting: Internal Medicine

## 2017-10-29 ENCOUNTER — Ambulatory Visit: Payer: BLUE CROSS/BLUE SHIELD | Admitting: Family Medicine

## 2017-10-29 ENCOUNTER — Encounter (HOSPITAL_COMMUNITY): Payer: Self-pay | Admitting: Emergency Medicine

## 2017-10-29 VITALS — BP 100/66 | HR 109 | Temp 98.1°F | Resp 17 | Ht 66.5 in | Wt 174.0 lb

## 2017-10-29 DIAGNOSIS — R42 Dizziness and giddiness: Secondary | ICD-10-CM

## 2017-10-29 DIAGNOSIS — E119 Type 2 diabetes mellitus without complications: Secondary | ICD-10-CM

## 2017-10-29 DIAGNOSIS — IMO0002 Reserved for concepts with insufficient information to code with codable children: Secondary | ICD-10-CM

## 2017-10-29 DIAGNOSIS — Z801 Family history of malignant neoplasm of trachea, bronchus and lung: Secondary | ICD-10-CM

## 2017-10-29 DIAGNOSIS — M549 Dorsalgia, unspecified: Secondary | ICD-10-CM

## 2017-10-29 DIAGNOSIS — R000 Tachycardia, unspecified: Secondary | ICD-10-CM | POA: Diagnosis not present

## 2017-10-29 DIAGNOSIS — K2210 Ulcer of esophagus without bleeding: Secondary | ICD-10-CM | POA: Diagnosis present

## 2017-10-29 DIAGNOSIS — D62 Acute posthemorrhagic anemia: Secondary | ICD-10-CM | POA: Diagnosis present

## 2017-10-29 DIAGNOSIS — E785 Hyperlipidemia, unspecified: Secondary | ICD-10-CM | POA: Diagnosis present

## 2017-10-29 DIAGNOSIS — C170 Malignant neoplasm of duodenum: Secondary | ICD-10-CM | POA: Diagnosis present

## 2017-10-29 DIAGNOSIS — R079 Chest pain, unspecified: Secondary | ICD-10-CM | POA: Diagnosis not present

## 2017-10-29 DIAGNOSIS — C787 Secondary malignant neoplasm of liver and intrahepatic bile duct: Secondary | ICD-10-CM

## 2017-10-29 DIAGNOSIS — K264 Chronic or unspecified duodenal ulcer with hemorrhage: Secondary | ICD-10-CM | POA: Diagnosis not present

## 2017-10-29 DIAGNOSIS — K8689 Other specified diseases of pancreas: Secondary | ICD-10-CM | POA: Diagnosis present

## 2017-10-29 DIAGNOSIS — Z7982 Long term (current) use of aspirin: Secondary | ICD-10-CM | POA: Diagnosis not present

## 2017-10-29 DIAGNOSIS — Z800 Family history of malignant neoplasm of digestive organs: Secondary | ICD-10-CM | POA: Diagnosis not present

## 2017-10-29 DIAGNOSIS — I10 Essential (primary) hypertension: Secondary | ICD-10-CM

## 2017-10-29 DIAGNOSIS — E118 Type 2 diabetes mellitus with unspecified complications: Secondary | ICD-10-CM | POA: Diagnosis not present

## 2017-10-29 DIAGNOSIS — K869 Disease of pancreas, unspecified: Secondary | ICD-10-CM | POA: Diagnosis not present

## 2017-10-29 DIAGNOSIS — K208 Other esophagitis: Secondary | ICD-10-CM | POA: Diagnosis not present

## 2017-10-29 DIAGNOSIS — K269 Duodenal ulcer, unspecified as acute or chronic, without hemorrhage or perforation: Secondary | ICD-10-CM | POA: Diagnosis not present

## 2017-10-29 DIAGNOSIS — R229 Localized swelling, mass and lump, unspecified: Secondary | ICD-10-CM

## 2017-10-29 DIAGNOSIS — Z806 Family history of leukemia: Secondary | ICD-10-CM

## 2017-10-29 DIAGNOSIS — Z8042 Family history of malignant neoplasm of prostate: Secondary | ICD-10-CM

## 2017-10-29 DIAGNOSIS — Z8041 Family history of malignant neoplasm of ovary: Secondary | ICD-10-CM

## 2017-10-29 DIAGNOSIS — E669 Obesity, unspecified: Secondary | ICD-10-CM | POA: Diagnosis present

## 2017-10-29 DIAGNOSIS — C250 Malignant neoplasm of head of pancreas: Secondary | ICD-10-CM | POA: Diagnosis present

## 2017-10-29 DIAGNOSIS — Z8249 Family history of ischemic heart disease and other diseases of the circulatory system: Secondary | ICD-10-CM

## 2017-10-29 DIAGNOSIS — Z6827 Body mass index (BMI) 27.0-27.9, adult: Secondary | ICD-10-CM | POA: Diagnosis not present

## 2017-10-29 DIAGNOSIS — R188 Other ascites: Secondary | ICD-10-CM | POA: Diagnosis present

## 2017-10-29 DIAGNOSIS — E876 Hypokalemia: Secondary | ICD-10-CM | POA: Diagnosis present

## 2017-10-29 DIAGNOSIS — Z808 Family history of malignant neoplasm of other organs or systems: Secondary | ICD-10-CM

## 2017-10-29 DIAGNOSIS — C772 Secondary and unspecified malignant neoplasm of intra-abdominal lymph nodes: Secondary | ICD-10-CM | POA: Diagnosis present

## 2017-10-29 DIAGNOSIS — K769 Liver disease, unspecified: Secondary | ICD-10-CM

## 2017-10-29 DIAGNOSIS — D72829 Elevated white blood cell count, unspecified: Secondary | ICD-10-CM

## 2017-10-29 DIAGNOSIS — Z833 Family history of diabetes mellitus: Secondary | ICD-10-CM

## 2017-10-29 DIAGNOSIS — K921 Melena: Secondary | ICD-10-CM | POA: Diagnosis present

## 2017-10-29 DIAGNOSIS — Z1501 Genetic susceptibility to malignant neoplasm of breast: Secondary | ICD-10-CM

## 2017-10-29 DIAGNOSIS — K922 Gastrointestinal hemorrhage, unspecified: Secondary | ICD-10-CM | POA: Diagnosis present

## 2017-10-29 DIAGNOSIS — I959 Hypotension, unspecified: Secondary | ICD-10-CM | POA: Diagnosis not present

## 2017-10-29 DIAGNOSIS — E860 Dehydration: Secondary | ICD-10-CM

## 2017-10-29 DIAGNOSIS — K760 Fatty (change of) liver, not elsewhere classified: Secondary | ICD-10-CM | POA: Diagnosis present

## 2017-10-29 DIAGNOSIS — R1013 Epigastric pain: Secondary | ICD-10-CM | POA: Diagnosis not present

## 2017-10-29 DIAGNOSIS — D649 Anemia, unspecified: Secondary | ICD-10-CM

## 2017-10-29 DIAGNOSIS — Z23 Encounter for immunization: Secondary | ICD-10-CM

## 2017-10-29 DIAGNOSIS — C259 Malignant neoplasm of pancreas, unspecified: Secondary | ICD-10-CM | POA: Diagnosis not present

## 2017-10-29 DIAGNOSIS — Z791 Long term (current) use of non-steroidal anti-inflammatories (NSAID): Secondary | ICD-10-CM

## 2017-10-29 DIAGNOSIS — R195 Other fecal abnormalities: Secondary | ICD-10-CM

## 2017-10-29 DIAGNOSIS — Z79899 Other long term (current) drug therapy: Secondary | ICD-10-CM

## 2017-10-29 DIAGNOSIS — R935 Abnormal findings on diagnostic imaging of other abdominal regions, including retroperitoneum: Secondary | ICD-10-CM | POA: Diagnosis not present

## 2017-10-29 DIAGNOSIS — R1084 Generalized abdominal pain: Secondary | ICD-10-CM | POA: Diagnosis not present

## 2017-10-29 DIAGNOSIS — Z7984 Long term (current) use of oral hypoglycemic drugs: Secondary | ICD-10-CM

## 2017-10-29 DIAGNOSIS — R634 Abnormal weight loss: Secondary | ICD-10-CM | POA: Diagnosis not present

## 2017-10-29 DIAGNOSIS — Z823 Family history of stroke: Secondary | ICD-10-CM

## 2017-10-29 DIAGNOSIS — K3189 Other diseases of stomach and duodenum: Secondary | ICD-10-CM | POA: Diagnosis not present

## 2017-10-29 DIAGNOSIS — C779 Secondary and unspecified malignant neoplasm of lymph node, unspecified: Secondary | ICD-10-CM | POA: Diagnosis not present

## 2017-10-29 LAB — BASIC METABOLIC PANEL
ANION GAP: 9 (ref 5–15)
BUN: 36 mg/dL — ABNORMAL HIGH (ref 6–20)
CALCIUM: 9.5 mg/dL (ref 8.9–10.3)
CHLORIDE: 101 mmol/L (ref 101–111)
CO2: 23 mmol/L (ref 22–32)
Creatinine, Ser: 1.17 mg/dL (ref 0.61–1.24)
GFR calc non Af Amer: >60 mL/min (ref >60)
GLUCOSE: 111 mg/dL — AB (ref 65–99)
POTASSIUM: 5.1 mmol/L (ref 3.5–5.1)
Sodium: 133 mmol/L — ABNORMAL LOW (ref 135–145)

## 2017-10-29 LAB — POCT CBC
Granulocyte percent: 84.2 %G — AB (ref 37–80)
HEMATOCRIT: 25.6 % — AB (ref 43.5–53.7)
HEMOGLOBIN: 8.1 g/dL — AB (ref 14.1–18.1)
LYMPH, POC: 1.7 (ref 0.6–3.4)
MCH, POC: 25.8 pg — AB (ref 27–31.2)
MCHC: 31.6 g/dL — AB (ref 31.8–35.4)
MCV: 81.7 fL (ref 80–97)
MID (CBC): 0.4 (ref 0–0.9)
MPV: 6.7 fL (ref 0–99.8)
POC GRANULOCYTE: 10.9 — AB (ref 2–6.9)
POC LYMPH %: 12.8 % (ref 10–50)
POC MID %: 3 % (ref 0–12)
Platelet Count, POC: 403 10*3/uL (ref 142–424)
RBC: 3.13 M/uL — AB (ref 4.69–6.13)
RDW, POC: 15.1 %
WBC: 13 10*3/uL — AB (ref 4.6–10.2)

## 2017-10-29 LAB — CBC WITH DIFFERENTIAL/PLATELET
Basophils Absolute: 0 10*3/uL (ref 0.0–0.1)
Basophils Relative: 0 %
Eosinophils Absolute: 0.1 10*3/uL (ref 0.0–0.7)
Eosinophils Relative: 1 %
HEMATOCRIT: 24.4 % — AB (ref 39.0–52.0)
Hemoglobin: 7.6 g/dL — ABNORMAL LOW (ref 13.0–17.0)
LYMPHS PCT: 12 %
Lymphs Abs: 1.4 10*3/uL (ref 0.7–4.0)
MCH: 26.1 pg (ref 26.0–34.0)
MCHC: 31.1 g/dL (ref 30.0–36.0)
MCV: 83.8 fL (ref 78.0–100.0)
MONO ABS: 0.7 10*3/uL (ref 0.1–1.0)
MONOS PCT: 6 %
NEUTROS ABS: 9.5 10*3/uL — AB (ref 1.7–7.7)
Neutrophils Relative %: 81 %
Platelets: 335 10*3/uL (ref 150–400)
RBC: 2.91 MIL/uL — ABNORMAL LOW (ref 4.22–5.81)
RDW: 14.6 % (ref 11.5–15.5)
WBC: 11.7 10*3/uL — ABNORMAL HIGH (ref 4.0–10.5)

## 2017-10-29 LAB — MAGNESIUM: MAGNESIUM: 2 mg/dL (ref 1.7–2.4)

## 2017-10-29 LAB — COMPREHENSIVE METABOLIC PANEL
ALT: 23 U/L (ref 17–63)
AST: 44 U/L — ABNORMAL HIGH (ref 15–41)
Albumin: 3.2 g/dL — ABNORMAL LOW (ref 3.5–5.0)
Alkaline Phosphatase: 113 U/L (ref 38–126)
Anion gap: 14 (ref 5–15)
BUN: 36 mg/dL — ABNORMAL HIGH (ref 6–20)
CHLORIDE: 102 mmol/L (ref 101–111)
CO2: 18 mmol/L — AB (ref 22–32)
CREATININE: 1.18 mg/dL (ref 0.61–1.24)
Calcium: 9.7 mg/dL (ref 8.9–10.3)
Glucose, Bld: 119 mg/dL — ABNORMAL HIGH (ref 65–99)
POTASSIUM: 6 mmol/L — AB (ref 3.5–5.1)
SODIUM: 134 mmol/L — AB (ref 135–145)
Total Bilirubin: 0.8 mg/dL (ref 0.3–1.2)
Total Protein: 6.9 g/dL (ref 6.5–8.1)

## 2017-10-29 LAB — ABO/RH: ABO/RH(D): O POS

## 2017-10-29 LAB — RETICULOCYTES
RBC.: 2.93 MIL/uL — AB (ref 4.22–5.81)
RETIC COUNT ABSOLUTE: 70.3 10*3/uL (ref 19.0–186.0)
RETIC CT PCT: 2.4 % (ref 0.4–3.1)

## 2017-10-29 LAB — CBC
HEMATOCRIT: 25 % — AB (ref 39.0–52.0)
HEMOGLOBIN: 7.8 g/dL — AB (ref 13.0–17.0)
MCH: 25.7 pg — AB (ref 26.0–34.0)
MCHC: 31.2 g/dL (ref 30.0–36.0)
MCV: 82.5 fL (ref 78.0–100.0)
PLATELETS: 356 10*3/uL (ref 150–400)
RBC: 3.03 MIL/uL — AB (ref 4.22–5.81)
RDW: 14.7 % (ref 11.5–15.5)
WBC: 13.5 10*3/uL — AB (ref 4.0–10.5)

## 2017-10-29 LAB — GLUCOSE, CAPILLARY: GLUCOSE-CAPILLARY: 115 mg/dL — AB (ref 65–99)

## 2017-10-29 LAB — TROPONIN I: Troponin I: <0.03 ng/mL (ref <0.03)

## 2017-10-29 LAB — HEMOGLOBIN A1C
Hgb A1c MFr Bld: 6.6 % — ABNORMAL HIGH (ref 4.8–5.6)
Mean Plasma Glucose: 142.72 mg/dL

## 2017-10-29 LAB — IFOBT (OCCULT BLOOD): IFOBT: NEGATIVE

## 2017-10-29 LAB — LIPASE, BLOOD: Lipase: 45 U/L (ref 11–51)

## 2017-10-29 LAB — OCCULT BLOOD X 1 CARD TO LAB, STOOL: FECAL OCCULT BLD: POSITIVE — AB

## 2017-10-29 MED ORDER — SODIUM CHLORIDE 0.9 % IV SOLN
INTRAVENOUS | Status: DC
  Administered 2017-10-30 – 2017-11-02 (×11): via INTRAVENOUS

## 2017-10-29 MED ORDER — SODIUM CHLORIDE 0.9 % IV BOLUS (SEPSIS)
1000.0000 mL | Freq: Once | INTRAVENOUS | Status: AC
  Administered 2017-10-29: 1000 mL via INTRAVENOUS

## 2017-10-29 MED ORDER — IOPAMIDOL (ISOVUE-300) INJECTION 61%
INTRAVENOUS | Status: AC
  Administered 2017-10-29: 100 mL via INTRAVENOUS
  Filled 2017-10-29: qty 100

## 2017-10-29 MED ORDER — SODIUM CHLORIDE 0.9 % IV SOLN
80.0000 mg | Freq: Once | INTRAVENOUS | Status: AC
  Administered 2017-10-29: 80 mg via INTRAVENOUS
  Filled 2017-10-29: qty 80

## 2017-10-29 MED ORDER — ADULT MULTIVITAMIN W/MINERALS CH
1.0000 | ORAL_TABLET | Freq: Every day | ORAL | Status: DC
  Administered 2017-10-31 – 2017-11-06 (×6): 1 via ORAL
  Filled 2017-10-29 (×6): qty 1

## 2017-10-29 MED ORDER — SODIUM CHLORIDE 0.9 % IV SOLN
8.0000 mg/h | INTRAVENOUS | Status: AC
  Administered 2017-10-30 – 2017-11-01 (×5): 8 mg/h via INTRAVENOUS
  Filled 2017-10-29 (×12): qty 80

## 2017-10-29 MED ORDER — SORBITOL 70 % SOLN: 30.0000 mL | Freq: Every day | Status: DC | PRN

## 2017-10-29 MED ORDER — ONDANSETRON HCL 4 MG/2ML IJ SOLN: 4.0000 mg | Freq: Four times a day (QID) | INTRAMUSCULAR | Status: DC | PRN

## 2017-10-29 MED ORDER — ACETAMINOPHEN 650 MG RE SUPP: 650.0000 mg | Freq: Four times a day (QID) | RECTAL | Status: DC | PRN

## 2017-10-29 MED ORDER — INSULIN ASPART 100 UNIT/ML ~~LOC~~ SOLN
0.0000 [IU] | Freq: Three times a day (TID) | SUBCUTANEOUS | Status: DC
  Administered 2017-11-03 – 2017-11-04 (×3): 1 [IU] via SUBCUTANEOUS

## 2017-10-29 MED ORDER — ACETAMINOPHEN 325 MG PO TABS
650.0000 mg | ORAL_TABLET | Freq: Four times a day (QID) | ORAL | Status: DC | PRN
  Administered 2017-11-03: 650 mg via ORAL
  Filled 2017-10-29: qty 2

## 2017-10-29 MED ORDER — OXYMETAZOLINE HCL 0.05 % NA SOLN
1.0000 | Freq: Two times a day (BID) | NASAL | Status: DC | PRN
  Administered 2017-10-30: 1 via NASAL
  Filled 2017-10-29: qty 15

## 2017-10-29 MED ORDER — INSULIN ASPART 100 UNIT/ML ~~LOC~~ SOLN: 0.0000 [IU] | Freq: Every day | SUBCUTANEOUS | Status: DC

## 2017-10-29 MED ORDER — TRAMADOL HCL 50 MG PO TABS: 50.0000 mg | ORAL_TABLET | Freq: Four times a day (QID) | ORAL | Status: DC | PRN

## 2017-10-29 MED ORDER — CENTRUM PO CHEW: 1.0000 | CHEWABLE_TABLET | Freq: Every day | ORAL | Status: DC

## 2017-10-29 MED ORDER — PANTOPRAZOLE SODIUM 40 MG IV SOLR
40.0000 mg | Freq: Two times a day (BID) | INTRAVENOUS | Status: DC
  Administered 2017-11-02 – 2017-11-06 (×9): 40 mg via INTRAVENOUS
  Filled 2017-10-29 (×9): qty 40

## 2017-10-29 MED ORDER — ONDANSETRON HCL 4 MG PO TABS: 4.0000 mg | ORAL_TABLET | Freq: Four times a day (QID) | ORAL | Status: DC | PRN

## 2017-10-29 MED ORDER — INFLUENZA VAC SPLIT QUAD 0.5 ML IM SUSY
0.5000 mL | PREFILLED_SYRINGE | INTRAMUSCULAR | Status: AC
  Administered 2017-10-30: 0.5 mL via INTRAMUSCULAR
  Filled 2017-10-29: qty 0.5

## 2017-10-29 NOTE — ED Triage Notes (Signed)
Patient reports having fatigue and dark stools for about week. Patient seen at [facility] and was told possible ulcer. Patient denies any blood thinners.

## 2017-10-29 NOTE — Progress Notes (Signed)
Subjective:  By signing my name below, I, [name], attest that this documentation has been prepared under the direction and in the presence of Cristian Agreste, MD Electronically Signed: Ladene Artist, ED Scribe 10/29/2017 at 9:02 AM.   Patient ID: [name], male    DOB: 1961/04/26, 57 y.o.   MRN: [id number]  Chief Complaint  Patient presents with  . Follow-up    labs   HPI [name] is a 56 y.o. male who presents to Primary Care at [facility] for follow-up on lab work. Last seen 8/9 for medication refills. Has had a h/o of LFT elevation.  Lab Results  Component Value Date   ALT 71 (H) 07/18/2017   AST 54 (H) 07/18/2017   ALKPHOS 87 07/18/2017   BILITOT 0.6 07/18/2017  Recommended to avoid alcohol and Tylenol. Abdominal US 12/2015 that was normal. LFTs were stable with previous reading of AFT 57, ALT 79 8 months ago.   DM Lab Results  Component Value Date   HGBA1C 7.9 (H) 07/18/2017   Lab Results  Component Value Date   MICROALBUR <0.2 10/18/2016  We increased his Metformin after last visit to 1000 mg in the morning and 500 mg at night. Pt has noticed decrease appetite since increasing Metformin. He has also noticed some light-headedness in the morning x 1-1.5 week which eases off after sitting for a while. Pt checks his blood glucose during those times with readings around 92 and BP averaging 129/80-85.    He also reports fleeting, non-radiating cp lasting 1-2 minutes with sob on exertion that seems to occur once every 3 weeks. He has noticed sob with working that resolves with rest. He also reports intermittent upper abdominal pain with occasional heartburn, dark stools over the past week with the last episode being yesterday, pallor, back pain that he describes as "someone's knee being in the middle of his back". No known fall or injury. Pt takes 1 Aleve tablet every few days. Denies nausea, vomiting. Mother passed from CHF, first noticed in her 49s.  He  did take his lisinopril/hctz this am.  Feels about the same this morning as he has all week, denies any new lightheadedness, dizziness, or worsening fatigue this morning.   Patient Active Problem List   Diagnosis Date Noted  . Genetic testing 01/07/2017  . BRCA2 positive 01/07/2017  . Family history of ovarian cancer   . Family history of prostate cancer   . Family history of BRCA2 gene positive   . Elevated LFTs 09/20/2013  . Annual physical exam 04/21/2012  . Thyromegaly 04/21/2012  . SKIN LESION 01/13/2010  . Diabetes type 2, controlled ([place]) 06/12/2007  . HYPERLIPIDEMIA 01/04/2007  . Hypertension 01/04/2007   Past Medical History:  Diagnosis Date  . BRCA2 positive   . Diabetes mellitus 2008  . Family history of BRCA2 gene positive   . Family history of ovarian cancer   . Family history of prostate cancer   . Fatty liver 2008   Fatty liver, saw GI    . Hyperlipidemia   . Hypertension    Past Surgical History:  Procedure Laterality Date  . NO PAST SURGERIES     No Known Allergies Prior to Admission medications   Medication Sig Start Date End Date Taking? Authorizing Provider  aspirin 325 MG tablet Take 325 mg by mouth daily.      [provider]  atorvastatin (LIPITOR) 10 MG tablet Take 1 tablet (10 mg total) by mouth at  bedtime. 07/18/17   Cristian Agreste, MD  blood glucose meter kit and supplies Dispense based on patient and insurance preference. Use up to four times daily as directed. (FOR ICD-9 250.00, 250.01). 07/04/15   Cristian Agreste, MD  fish oil-omega-3 fatty acids 1000 MG capsule Take 4 g by mouth daily.     [provider]  glucose blood test strip Use to test blood sugar up to 4 times daily as directed. Dx code: E11.9 07/07/15   Cristian Agreste, MD  lisinopril-hydrochlorothiazide (PRINZIDE,ZESTORETIC) 10-12.5 MG tablet Take 1 tablet by mouth daily. 07/18/17   Cristian Agreste, MD  metFORMIN (GLUCOPHAGE) 500 MG tablet Take 1 tablet (500 mg  total) by mouth 2 (two) times daily with a meal. 07/18/17   Cristian Agreste, MD  multivitamin-iron-minerals-folic acid (CENTRUM) chewable tablet Chew 1 tablet by mouth daily.      [provider]   Social History   Socioeconomic History  . Marital status: Single    Spouse name: Not on file  . Number of children: 0  . Years of education: Not on file  . Highest education level: Not on file  Social Needs  . Financial resource strain: Not on file  . Food insecurity - worry: Not on file  . Food insecurity - inability: Not on file  . Transportation needs - medical: Not on file  . Transportation needs - non-medical: Not on file  Occupational History  . Occupation: [employer]    Employer: [employer]  Tobacco Use  . Smoking status: Never Smoker  . Smokeless tobacco: Never Used  Substance and Sexual Activity  . Alcohol use: No  . Drug use: No  . Sexual activity: Not on file  Other Topics Concern  . Not on file  Social History Narrative   Lives by himself                Review of Systems  Constitutional: Positive for appetite change.  Respiratory: Positive for shortness of breath.   Cardiovascular: Positive for chest pain.  Gastrointestinal: Positive for abdominal pain. Negative for nausea and vomiting.  Musculoskeletal: Positive for back pain.  Skin: Positive for pallor.  Neurological: Positive for light-headedness.      Objective:   Physical Exam  Constitutional: He is oriented to person, place, and time. He appears well-developed and well-nourished.  HENT:  Head: Normocephalic and atraumatic.  Mouth/Throat: Mucous membranes are pale (slightly).  Eyes: EOM are normal. Pupils are equal, round, and reactive to light.  Neck: No JVD present. Carotid bruit is not present.  Cardiovascular: Normal rate, regular rhythm and normal heart sounds.  No murmur heard. Pulmonary/Chest: Effort normal and breath sounds normal. He has no rales.  Genitourinary:  Genitourinary  Comments: Rectal exam: No blood seen.  Musculoskeletal: He exhibits no edema.  Mid back: nontender.  Neurological: He is alert and oriented to person, place, and time.  Skin: Skin is warm and dry.  Psychiatric: He has a normal mood and affect.  Vitals reviewed.  Vitals:   10/29/17 0825  BP: 122/72  Pulse: (!) 109  Resp: 17  Temp: 98.1 F (36.7 C)  TempSrc: Oral  SpO2: 98%  Weight: 174 lb (78.9 kg)  Height: 5' 6.5" (1.689 m)   Results for orders placed or performed in visit on 10/29/17  POCT CBC  Result Value Ref Range   WBC 13.0 (A) 4.6 - 10.2 K/uL   Lymph, poc 1.7 0.6 - 3.4   POC  LYMPH PERCENT 12.8 10 - 50 %L   MID (cbc) 0.4 0 - 0.9   POC MID % 3.0 0 - 12 %M   POC Granulocyte 10.9 (A) 2 - 6.9   Granulocyte percent 84.2 (A) 37 - 80 %G   RBC 3.13 (A) 4.69 - 6.13 M/uL   Hemoglobin 8.1 (A) 14.1 - 18.1 g/dL   HCT, POC 25.6 (A) 43.5 - 53.7 %   MCV 81.7 80 - 97 fL   MCH, POC 25.8 (A) 27 - 31.2 pg   MCHC 31.6 (A) 31.8 - 35.4 g/dL   RDW, POC 15.1 %   Platelet Count, POC 403 142 - 424 K/uL   MPV 6.7 0 - 99.8 fL  IFOBT POC (occult bld, rslt in office)  Result Value Ref Range   IFOBT Negative    Hemoglobin 15.8 in October 2015.   minimal stool for hemocult.  EKG reading done by Cristian Agreste, MD: sinus rhythm rate 100. No acute findings.   Orthostatic VS for the past 24 hrs (Last 3 readings):  BP- Lying Pulse- Lying BP- Sitting Pulse- Sitting BP- Standing at 0 minutes Pulse- Standing at 0 minutes  10/29/17 0943 94/65 95 93/66 96 94/64 110       10:15 AM BP100/66, denies dizziness.  Assessment & Plan:  Rebekah Sprinkle is a 56 y.o. male Type 2 diabetes mellitus without complication, without long-term current use of insulin ([place]) - Plan: Hemoglobin A1c  - Tolerating metformin at current dose. Check A1c.  Tachycardia - Plan: EKG 12-Lead, Orthostatic vital signs Lightheadedness - Plan: POCT CBC, EKG 12-Lead, Orthostatic vital signs, IFOBT POC (occult bld, rslt in  office), IFOBT POC (occult bld, rslt in office) Abdominal pain, epigastric - Plan: POCT CBC, IFOBT POC (occult bld, rslt in office), IFOBT POC (occult bld, rslt in office) Dark stools - Plan: IFOBT POC (occult bld, rslt in office), IFOBT POC (occult bld, rslt in office) Anemia, unspecified type  -One week history of lightheadedness, intermittent abdominal pain, epigastric and upper back pain. Dark stools during that time, denies syncope and only slight lightheadedness with standing. No acute changes past few days. Anemic with hemoglobin 8.1, dropped from 15.8 a few years ago. Symptoms concerning for possible gastrointestinal bleeding, and may be cause of intermittent upper back pain with radiating pain.  Intermittent NSAIDs, no other known cause.  - Further evaluation needed through the emergency room for possible hospital admission, and evaluation of possible gastrointestinal bleeding  - Borderline low blood pressures with orthostatics, but repeat pressure is stable, denies lightheadedness or dizziness with standing in the office including with ambulation in the office. Agreed to allow private vehicle to emergency room. Advised if any change in symptoms to pull over and call 911.  -10:26 AM Report given to the charge nurse at [facility].  Upper back pain Chest pain, unspecified type - Plan: EKG 12-Lead  - Rare chest pain with exertion, asymptomatic at present, no acute findings on EKG. Advised to discuss an emergency room, and may need outpatient cardiology follow-up given history of diabetes, hypertension and age as risk factors.  - Upper back nontender, and again as above may have radiating pain from intestinal issues.  Over 40 minutes of time with history, repeat eval and coordination of care - greater than 50% counseling. Sent to ER due to possible emergent anemia or potential GI bleed.   No orders of the defined types were placed in this encounter.  Patient Instructions  IF you  received an x-ray today, you will receive an invoice from [facility]. Please contact [facility] at [phone] with questions or concerns regarding your invoice.   IF you received labwork today, you will receive an invoice from [place]. Please contact [facility] at [phone] with questions or concerns regarding your invoice.   Our billing staff will not be able to assist you with questions regarding bills from these companies.  You will be contacted with the lab results as soon as they are available. The fastest way to get your results is to activate your My Chart account. Instructions are located on the last page of this paperwork. If you have not heard from [place] regarding the results in 2 weeks, please contact this office.

## 2017-10-29 NOTE — ED Provider Notes (Signed)
[facility] DEPT Provider Note   CSN: [id number] Arrival date & time: 10/29/17  1108     History   Chief Complaint Chief Complaint  Patient presents with  . dark stool  . Fatigue    HPI [name] is a 56 y.o. male presenting with one-week history of weakness, dark stools, and dizziness on standing.  Patient states that for the past week, he has been feeling poorly.  He was seen at his primary care today, and told to come to the emergency room for further evaluation.  At his doctor's office, he was reported to be hypotensive.  He does not know the number.  Patient states that for the past week, he has had postprandial epigastric pain.  This occurs about 30 minutes after eating.  He has never had these symptoms before or pain in his abdomen like this.  He denies history of GERD.  He states he has been taking Pepto-Bismol pills intermittently over the past week, which has improved his abdominal pain.  Patient states that over the past 2 months, he has lost approximately 20 pounds unintentionally, stating he has had a very poor appetite.  Just prior to appetite change, patient had an increase in his metformin dosage from 1000-1500.  Patient states he does not believe he looks pale, however he states his doctor was concerned about his pallor.  He denies fevers, chills, chest pain, shortness of breath, nausea, vomiting, lower abdominal pain, urinary symptoms.  He denies bright red blood in his stool.  He had a colonoscopy 5 years ago with Dr. Fuller Plan from [place], where he had 1 polyp that was removed. He is schedueld for repeat colonoscopy next year.  Patient tested positive for BRCA2 mutation in February.  HPI  Past Medical History:  Diagnosis Date  . BRCA2 positive   . Diabetes mellitus 2008  . Family history of BRCA2 gene positive   . Family history of ovarian cancer   . Family history of prostate cancer   . Fatty liver 2008   Fatty liver, saw GI     . Hyperlipidemia   . Hypertension     Patient Active Problem List   Diagnosis Date Noted  . Genetic testing 01/07/2017  . BRCA2 positive 01/07/2017  . Family history of ovarian cancer   . Family history of prostate cancer   . Family history of BRCA2 gene positive   . Elevated LFTs 09/20/2013  . Annual physical exam 04/21/2012  . Thyromegaly 04/21/2012  . SKIN LESION 01/13/2010  . Diabetes type 2, controlled ([place]) 06/12/2007  . HYPERLIPIDEMIA 01/04/2007  . Hypertension 01/04/2007    Past Surgical History:  Procedure Laterality Date  . NO PAST SURGERIES         Home Medications    Prior to Admission medications   Medication Sig Start Date End Date Taking? Authorizing Provider  aspirin 325 MG tablet Take 325 mg by mouth daily.      [provider]  atorvastatin (LIPITOR) 10 MG tablet Take 1 tablet (10 mg total) by mouth at bedtime. 07/18/17   [name], MD  blood glucose meter kit and supplies Dispense based on patient and insurance preference. Use up to four times daily as directed. (FOR ICD-9 250.00, 250.01). 07/04/15   [name], MD  fish oil-omega-3 fatty acids 1000 MG capsule Take 4 g by mouth daily.     [provider]  glucose blood test strip Use to test blood  sugar up to 4 times daily as directed. Dx code: E11.9 07/07/15   [name], MD  lisinopril-hydrochlorothiazide (PRINZIDE,ZESTORETIC) 10-12.5 MG tablet Take 1 tablet by mouth daily. 07/18/17   [name], MD  metFORMIN (GLUCOPHAGE) 500 MG tablet Take 1 tablet (500 mg total) by mouth 2 (two) times daily with a meal. 07/18/17   [name], MD  multivitamin-iron-minerals-folic acid (CENTRUM) chewable tablet Chew 1 tablet by mouth daily.      [provider]    Family History Family History  Problem Relation Age of Onset  . Diabetes Father   . Lung cancer Father        smoker  . Stroke Mother   . Ovarian cancer Sister 32  . BRCA 1/2 Sister         BRCA2 pos  . Leukemia Maternal Aunt   . Stomach cancer Maternal Grandfather   . Prostate cancer Paternal Grandfather   . Melanoma Sister   . BRCA 1/2 Sister        BRCA2 negative  . Leukemia Cousin        maternal first cousin  . Heart attack Neg Hx   . Colon cancer Neg Hx     Social History Social History   Tobacco Use  . Smoking status: Never Smoker  . Smokeless tobacco: Never Used  Substance Use Topics  . Alcohol use: No  . Drug use: No     Allergies   Patient has no known allergies.   Review of Systems Review of Systems  Constitutional: Positive for appetite change and unexpected weight change.  Gastrointestinal: Positive for abdominal pain (post-prandial) and blood in stool (dark stools).  Neurological: Positive for dizziness (when standing) and weakness.  All other systems reviewed and are negative.    Physical Exam Updated Vital Signs BP (!) 105/57 (BP Location: Left Arm)   Pulse 95   Temp 98.2 F (36.8 C) (Oral)   Resp 16   SpO2 100%   Physical Exam  Constitutional: He is oriented to person, place, and time. He appears well-developed and well-nourished. No distress.  HENT:  Head: Normocephalic and atraumatic.  Mouth/Throat: Mucous membranes are pale and dry.  Eyes: EOM are normal.  Neck: Normal range of motion.  Cardiovascular: Normal rate, regular rhythm and intact distal pulses.  HR slightly elevated, upper 90's  Pulmonary/Chest: Effort normal and breath sounds normal. No respiratory distress. He has no wheezes.  Abdominal: Soft. He exhibits no distension and no mass. There is tenderness (epigastric). There is no rebound and no guarding.  Tenderness palpation of the epigastric area.  No rigidity, distention, or guarding.  Genitourinary: Rectal exam shows guaiac positive stool.  Genitourinary Comments: No gross blood seen on rectal exam.  Chaperone present.  Musculoskeletal: Normal range of motion.  Neurological: He is alert and oriented to  person, place, and time.  Skin: Skin is warm and dry. There is pallor.  Psychiatric: He has a normal mood and affect.  Nursing note and vitals reviewed.    ED Treatments / Results  Labs (all labs ordered are listed, but only abnormal results are displayed) Labs Reviewed  COMPREHENSIVE METABOLIC PANEL - Abnormal; Notable for the following components:      Result Value   Sodium 134 (*)    Potassium 6.0 (*)    CO2 18 (*)    Glucose, Bld 119 (*)    BUN 36 (*)    Albumin 3.2 (*)    AST 44 (*)  All other components within normal limits  CBC - Abnormal; Notable for the following components:   WBC 13.5 (*)    RBC 3.03 (*)    Hemoglobin 7.8 (*)    HCT 25.0 (*)    MCH 25.7 (*)    All other components within normal limits  BASIC METABOLIC PANEL - Abnormal; Notable for the following components:   Sodium 133 (*)    Glucose, Bld 111 (*)    BUN 36 (*)    All other components within normal limits  OCCULT BLOOD X 1 CARD TO LAB, STOOL - Abnormal; Notable for the following components:   Fecal Occult Bld POSITIVE (*)    All other components within normal limits  RETICULOCYTES - Abnormal; Notable for the following components:   RBC. 2.93 (*)    All other components within normal limits  CBC WITH DIFFERENTIAL/PLATELET - Abnormal; Notable for the following components:   WBC 11.7 (*)    RBC 2.91 (*)    Hemoglobin 7.6 (*)    HCT 24.4 (*)    Neutro Abs 9.5 (*)    All other components within normal limits  HEMOGLOBIN A1C - Abnormal; Notable for the following components:   Hgb A1c MFr Bld 6.6 (*)    All other components within normal limits  GLUCOSE, CAPILLARY - Abnormal; Notable for the following components:   Glucose-Capillary 115 (*)    All other components within normal limits  URINE CULTURE  LIPASE, BLOOD  TROPONIN I  MAGNESIUM  VITAMIN B12  FOLATE  IRON AND TIBC  FERRITIN  URINALYSIS, ROUTINE W REFLEX MICROSCOPIC  CBC WITH DIFFERENTIAL/PLATELET  CANCER ANTIGEN 19-9  HIV  ANTIBODY (ROUTINE TESTING)  COMPREHENSIVE METABOLIC PANEL  PROTIME-INR  CBC WITH DIFFERENTIAL/PLATELET  CBC WITH DIFFERENTIAL/PLATELET  POC OCCULT BLOOD, ED  TYPE AND SCREEN  ABO/RH    EKG  EKG Interpretation None       Radiology Dg Chest 2 View  Result Date: 10/29/2017 CLINICAL DATA:  Hypotension. EXAM: CHEST  2 VIEW COMPARISON:  None. FINDINGS: The heart size and mediastinal contours are within normal limits. Both lungs are clear. The visualized skeletal structures are unremarkable. IMPRESSION: No active cardiopulmonary disease. Electronically Signed   By: Ashley Royalty M.D.   On: 10/29/2017 17:56   Ct Abdomen Pelvis W Contrast  Result Date: 10/29/2017 CLINICAL DATA:  Fatigue for 1 week and epigastric pain with weight loss EXAM: CT ABDOMEN AND PELVIS WITH CONTRAST TECHNIQUE: Multidetector CT imaging of the abdomen and pelvis was performed using the standard protocol following bolus administration of intravenous contrast. CONTRAST:  165m ISOVUE-300 IOPAMIDOL (ISOVUE-300) INJECTION 61% COMPARISON:  None. FINDINGS: Lower chest: Lung bases are free of acute infiltrate or sizable effusion. Hepatobiliary: The gallbladder is within normal limits. The liver demonstrates multiple hypodense lesions with peripheral enhancement most consistent with metastatic disease. These do not demonstrate significant enhancement on delayed images. The largest of these in the left lobe of the liver measures approximately 5.1 cm. The largest of these in the right lobe of the liver measures approximately 5.8 cm. No biliary ductal dilatation is seen. Pancreas: The body and tail of the pancreas are within normal limits. In the region of the pancreatic head however in the head as prominent with suggestion of underlying mass lesion. This would correspond with the changes seen in the liver. There is suggestion of mass lesion measures approximately 5 cm in greatest dimension. Some associated adjacent necrotic appearing  lymph nodes are seen in the region of the  celiac axis (15 mm) as well as in the periaortic region and mesentery (12 mm). Peripancreatic (2.1 cm) and gastrohepatic (11 mm) adenopathy is noted as well. An adjacent duodenal diverticulum is seen with both air and fluid within. Spleen: Normal in size without focal abnormality. Adrenals/Urinary Tract: The adrenal glands are within normal limits. The kidneys demonstrates no obstructive change. Large nonobstructing right renal stone is seen measuring approximately 10 mm. The bladder is partially distended. Stomach/Bowel: Bilateral inguinal hernias are identified with loops of small bowel within the right inguinal hernia and loops of sigmoid colon in the left inguinal hernia. No obstructive changes are seen. The appendix is within normal limits. Scattered diverticular change is noted in the colon without diverticulitis. Large duodenal diverticulum is noted adjacent to the head of the pancreas. Vascular/Lymphatic: Atherosclerotic calcifications are identified. Additionally there are changes of lymphadenopathy along the right common iliac artery measuring approximately 12 mm in short axis. Periaortic and intra-aortocaval lymph nodes are seen as well as the previously described changes in the region of the gastrohepatic ligament as well as the peripancreatic region. Some of these nodes are intimately associated with suggested pancreatic head mass. The largest of these measures approximately 2.1 cm in short axis with central necrosis. Reproductive: Prostate is unremarkable. Other: No abdominal wall hernia or abnormality. No abdominopelvic ascites. Musculoskeletal: No acute or significant osseous findings. IMPRESSION: Changes consistent with hepatic metastatic disease and likely primary pancreatic mass in the region of the head of the pancreas. Surrounding associated lymphadenopathy is noted as described. Tissue sampling of the liver lesions is recommended. Additional workup for  distant metastatic disease is recommended as well. Nonobstructing right renal stone These results were called by telephone at the time of interpretation on 10/29/2017 at 2:52 pm to [facility] , who verbally acknowledged these results. Electronically Signed   By: Inez Catalina M.D.   On: 10/29/2017 14:56    Procedures Procedures (including critical care time)  Medications Ordered in ED Medications  sodium chloride 0.9 % bolus 1,000 mL (1,000 mLs Intravenous New Bag/Given 10/29/17 1341)     Initial Impression / Assessment and Plan / ED Course  I have reviewed the triage vital signs and the nursing notes.  Pertinent labs & imaging results that were available during my care of the patient were reviewed by me and considered in my medical decision making (see chart for details).     Patient presenting with epigastric pain, dark stools, dizziness, and weakness.  Physical exam shows patient appears pale.  Initial heart rate elevated at 118.  Patient hypotensive.  Liter of fluid started.  CMP shows high hyperkalemia at 6.  Liver enzymes improved.  Hemoccult positive.  CBC shows hemoglobin of 7.8, last labs 3 years ago at 52.  Elevated white count at 13.5.  Type and screen ordered.  Will order CT scan due to bleeding and 20 pound weight loss in 2 months.  He denies pain or nausea at this time.  Heart rate and blood pressure improved with fluids.  Patient denies symptoms at this time.  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  Discussed findings with patient.  Will consult with hospitalist for admission.  Discussed with hospitalist, patient be admitted.  Discussed case with [facility], and they will consult with patient and follow while in hospital.  Discussed case with Dr. Lucia Gaskins from surgery.  They will consult while patient is in the hospital., starting tomorrow.   Final Clinical Impressions(s) / ED  Diagnoses   Final diagnoses:  Symptomatic anemia    Gastrointestinal hemorrhage, unspecified gastrointestinal hemorrhage type  Pancreatic mass  Metastases to the liver [facility])    ED Discharge Orders    None       Franchot Heidelberg, PA-C 10/30/17 Everardo Beals, MD 10/30/17 [phone]

## 2017-10-29 NOTE — Progress Notes (Signed)
Our service is aware of patient and will do full consult tomorrow. Per review of chart, recommendations as below.  A: - Suspected Upper GI Bleed: intermittent upper abdominal pain, heartburn and dark stools over the past week, now with hgb of 7.8, previously 15 per chart review 15 yrs ago; consider PUD vs other - Abnormal CT abdomen: showing changes consistent with hepatic metastatic disease and likely primary pancreatic mass in the region of the head of the pancreas, new for the patient  P: 1. Please hold all anticoagulants 2. Patient should remain NPO after midnight 3. Planning for EGD tomorrow with Dr. Silverio Decamp. Will discuss with patient in the morning. 4. Continue supportive measures 5. Monitor hgb with transfusion as needed <7 6. Please await further recommendations tomorrow.  Thank you for your kind consultation. Ellouise Newer, PA-C

## 2017-10-29 NOTE — ED Notes (Signed)
Bed: WA09 Expected date:  Expected time:  Means of arrival:  Comments: Triage pt [facility]

## 2017-10-29 NOTE — H&P (Signed)
History and Physical    Doctor Sheahan ZOX:[id number] DOB: 06/12/1961 DOA: 10/29/2017  PCP: [name], MD  Patient coming from: Home  I have personally briefly reviewed patient's old medical records in [place]  Chief Complaint: Low hemoglobin/black tarry stools/weakness  HPI: [name] is a 56 y.o. male with medical history significant of hypertension, diabetes, family history of ovarian, Prostate cancer, family history positive for BRCA2 gene, hyperlipidemia who presents to the ED with a 1-1/2-week history of upper abdominal pain occasionally radiating to his back, dizziness, lightheadedness, black tarry stools, generalized weakness.  Patient also endorses a 20 pound weight loss over the past 2 months with a decreased appetite.  Patient denies any fevers, no chills, no nausea, no vomiting, no chest pain, no shortness of breath, no syncope, no diarrhea, no constipation, no dysuria, no hematochezia, no hematemesis.  Patient had presented to his PCPs office or lab work which was obtained showed an anemia with a hemoglobin of 8.1.  Patient's last hemoglobin on epic was 15.8 on 09/20/2014.  Patient denies any anticoagulant use.  Patient does endorse NSAID use of Aleve for his upper abdominal pain.  Patient states takes 1 tablet daily.  Patient subsequently sent to the ED from PCPs office.  ED Course: In the ED CBC obtained had a white count of 13.5, hemoglobin of 7.8, platelets of 356 otherwise was within normal limits.  Basic metabolic profile obtained had a sodium of 133 glucose of 111 BUN of 36 creatinine of 1.17 and twice was within normal limits.  Lipase level was 45.  First set of troponin was less than 0.03.  Chest x-ray was not done.  Urinalysis was not done.  EKG was not done.  Review of Systems: As per HPI otherwise 10 point review of systems negative.   Past Medical History:  Diagnosis Date  . BRCA2 positive   . Diabetes mellitus 2008  . Family history of  BRCA2 gene positive   . Family history of ovarian cancer   . Family history of prostate cancer   . Fatty liver 2008   Fatty liver, saw GI    . Hyperlipidemia   . Hypertension     Past Surgical History:  Procedure Laterality Date  . NO PAST SURGERIES       reports that  has never smoked. he has never used smokeless tobacco. He reports that he does not drink alcohol or use drugs.  No Known Allergies  Family History  Problem Relation Age of Onset  . Diabetes Father   . Lung cancer Father        smoker  . Stroke Mother   . Ovarian cancer Sister 85  . BRCA 1/2 Sister        BRCA2 pos  . Leukemia Maternal Aunt   . Stomach cancer Maternal Grandfather   . Prostate cancer Paternal Grandfather   . Melanoma Sister   . BRCA 1/2 Sister        BRCA2 negative  . Leukemia Cousin        maternal first cousin  . Heart attack Neg Hx   . Colon cancer Neg Hx    Father deceased age 76 from lung cancer stage IV.  Mother deceased age 53 with a history of coronary artery disease.  Prior to Admission medications   Medication Sig Start Date End Date Taking? Authorizing Provider  aspirin 325 MG tablet Take 325 mg by mouth daily.     Yes [provider]  atorvastatin (LIPITOR) 10 MG tablet Take 1 tablet (10 mg total) by mouth at bedtime. 07/18/17  Yes [name], MD  fish oil-omega-3 fatty acids 1000 MG capsule Take 4 g by mouth daily.    Yes [provider]  lisinopril-hydrochlorothiazide (PRINZIDE,ZESTORETIC) 10-12.5 MG tablet Take 1 tablet by mouth daily. 07/18/17  Yes [name], MD  metFORMIN (GLUCOPHAGE) 500 MG tablet Take 1 tablet (500 mg total) by mouth 2 (two) times daily with a meal. Patient taking differently: Take 500-1,000 mg by mouth 2 (two) times daily with a meal. 2 TABLETS IN THE AM AND 1 TABLET IN THE EVENING 07/18/17  Yes [name], MD  multivitamin-iron-minerals-folic acid (CENTRUM) chewable tablet Chew 1 tablet by mouth daily.     Yes  [provider]  naproxen sodium (ALEVE) 220 MG tablet Take 220 mg by mouth daily as needed (PAIN).   Yes [provider]  blood glucose meter kit and supplies Dispense based on patient and insurance preference. Use up to four times daily as directed. (FOR ICD-9 250.00, 250.01). 07/04/15   [name], MD  glucose blood test strip Use to test blood sugar up to 4 times daily as directed. Dx code: E11.9 07/07/15   [name], MD    Physical Exam: Vitals:   10/29/17 1342 10/29/17 1441 10/29/17 1442 10/29/17 1634  BP: (!) 105/57  112/70 123/67  Pulse: 95 93 93 90  Resp: 16 16 16 16   Temp:      TempSrc:      SpO2: 100% 100% 100% 98%    Constitutional: NAD, calm, comfortable. Pallor Vitals:   10/29/17 1342 10/29/17 1441 10/29/17 1442 10/29/17 1634  BP: (!) 105/57  112/70 123/67  Pulse: 95 93 93 90  Resp: 16 16 16 16   Temp:      TempSrc:      SpO2: 100% 100% 100% 98%   Eyes: PERRLA, lids and conjunctivae normal ENMT: Mucous membranes are dry. Posterior pharynx clear of any exudate or lesions.Normal dentition.  Neck: normal, supple, no masses, no thyromegaly Respiratory: clear to auscultation bilaterally, no wheezing, no crackles. Normal respiratory effort. No accessory muscle use.  Cardiovascular: Regular rate and rhythm, no murmurs / rubs / gallops. No extremity edema. 2+ pedal pulses. No carotid bruits.  Abdomen: no tenderness, no masses palpated. No hepatosplenomegaly. Bowel sounds positive.  Musculoskeletal: no clubbing / cyanosis. No joint deformity upper and lower extremities. Good ROM, no contractures. Normal muscle tone.  Skin: no rashes, lesions, ulcers. No induration Neurologic: CN 2-12 grossly intact. Sensation intact, DTR normal. Strength 5/5 in all 4.  Psychiatric: Normal judgment and insight. Alert and oriented x 3. Normal mood.   Labs on Admission: I have personally reviewed following labs and imaging studies  CBC: Recent Labs  Lab  10/29/17 0926 10/29/17 1337  WBC 13.0* 13.5*  HGB 8.1* 7.8*  HCT 25.6* 25.0*  MCV 81.7 82.5  PLT  --  563   Basic Metabolic Panel: Recent Labs  Lab 10/29/17 1151 10/29/17 1337  NA 134* 133*  K 6.0* 5.1  CL 102 101  CO2 18* 23  GLUCOSE 119* 111*  BUN 36* 36*  CREATININE 1.18 1.17  CALCIUM 9.7 9.5   GFR: Estimated Creatinine Clearance: 71.2 mL/min (by C-G formula based on SCr of 1.17 mg/dL). Liver Function Tests: Recent Labs  Lab 10/29/17 1151  AST 44*  ALT 23  ALKPHOS 113  BILITOT 0.8  PROT 6.9  ALBUMIN 3.2*  Recent Labs  Lab 10/29/17 1337  LIPASE 45   No results for input(s): AMMONIA in the last 168 hours. Coagulation Profile: No results for input(s): INR, PROTIME in the last 168 hours. Cardiac Enzymes: Recent Labs  Lab 10/29/17 1337  TROPONINI <0.03   BNP (last 3 results) No results for input(s): PROBNP in the last 8760 hours. HbA1C: No results for input(s): HGBA1C in the last 72 hours. CBG: No results for input(s): GLUCAP in the last 168 hours. Lipid Profile: No results for input(s): CHOL, HDL, LDLCALC, TRIG, CHOLHDL, LDLDIRECT in the last 72 hours. Thyroid Function Tests: No results for input(s): TSH, T4TOTAL, FREET4, T3FREE, THYROIDAB in the last 72 hours. Anemia Panel: No results for input(s): VITAMINB12, FOLATE, FERRITIN, TIBC, IRON, RETICCTPCT in the last 72 hours. Urine analysis: No results found for: COLORURINE, APPEARANCEUR, LABSPEC, [place], GLUCOSEU, HGBUR, BILIRUBINUR, KETONESUR, PROTEINUR, UROBILINOGEN, NITRITE, LEUKOCYTESUR  Radiological Exams on Admission: Ct Abdomen Pelvis W Contrast  Result Date: 10/29/2017 CLINICAL DATA:  Fatigue for 1 week and epigastric pain with weight loss EXAM: CT ABDOMEN AND PELVIS WITH CONTRAST TECHNIQUE: Multidetector CT imaging of the abdomen and pelvis was performed using the standard protocol following bolus administration of intravenous contrast. CONTRAST:  178m ISOVUE-300 IOPAMIDOL (ISOVUE-300)  INJECTION 61% COMPARISON:  None. FINDINGS: Lower chest: Lung bases are free of acute infiltrate or sizable effusion. Hepatobiliary: The gallbladder is within normal limits. The liver demonstrates multiple hypodense lesions with peripheral enhancement most consistent with metastatic disease. These do not demonstrate significant enhancement on delayed images. The largest of these in the left lobe of the liver measures approximately 5.1 cm. The largest of these in the right lobe of the liver measures approximately 5.8 cm. No biliary ductal dilatation is seen. Pancreas: The body and tail of the pancreas are within normal limits. In the region of the pancreatic head however in the head as prominent with suggestion of underlying mass lesion. This would correspond with the changes seen in the liver. There is suggestion of mass lesion measures approximately 5 cm in greatest dimension. Some associated adjacent necrotic appearing lymph nodes are seen in the region of the celiac axis (15 mm) as well as in the periaortic region and mesentery (12 mm). Peripancreatic (2.1 cm) and gastrohepatic (11 mm) adenopathy is noted as well. An adjacent duodenal diverticulum is seen with both air and fluid within. Spleen: Normal in size without focal abnormality. Adrenals/Urinary Tract: The adrenal glands are within normal limits. The kidneys demonstrates no obstructive change. Large nonobstructing right renal stone is seen measuring approximately 10 mm. The bladder is partially distended. Stomach/Bowel: Bilateral inguinal hernias are identified with loops of small bowel within the right inguinal hernia and loops of sigmoid colon in the left inguinal hernia. No obstructive changes are seen. The appendix is within normal limits. Scattered diverticular change is noted in the colon without diverticulitis. Large duodenal diverticulum is noted adjacent to the head of the pancreas. Vascular/Lymphatic: Atherosclerotic calcifications are identified.  Additionally there are changes of lymphadenopathy along the right common iliac artery measuring approximately 12 mm in short axis. Periaortic and intra-aortocaval lymph nodes are seen as well as the previously described changes in the region of the gastrohepatic ligament as well as the peripancreatic region. Some of these nodes are intimately associated with suggested pancreatic head mass. The largest of these measures approximately 2.1 cm in short axis with central necrosis. Reproductive: Prostate is unremarkable. Other: No abdominal wall hernia or abnormality. No abdominopelvic ascites. Musculoskeletal: No acute or significant osseous  findings. IMPRESSION: Changes consistent with hepatic metastatic disease and likely primary pancreatic mass in the region of the head of the pancreas. Surrounding associated lymphadenopathy is noted as described. Tissue sampling of the liver lesions is recommended. Additional workup for distant metastatic disease is recommended as well. Nonobstructing right renal stone These results were called by telephone at the time of interpretation on 10/29/2017 at 2:52 pm to [facility], [facility] , who verbally acknowledged these results. Electronically Signed   By: Inez Catalina M.D.   On: 10/29/2017 14:56    EKG: Not done  Assessment/Plan Principal Problem:   GI bleed Active Problems:   Diabetes type 2, controlled ([place])   Hypertension   Anemia   Acute blood loss anemia   Pancreatic mass: Per CT abd/pelvis 10/29/2017   Dehydration   Leukocytosis   #1 GI bleed Patient presented with melanotic stools, lightheadedness and noted to have a hemoglobin of 7.8 on admission.  Last noted hemoglobin of 15.8 was 09/20/2014.  Patient also noted use of NSAIDs due to his upper abdominal pain.  Will admit to telemetry.  Check CBCs every 8 hours.  Placed on clear liquids and n.p.o. after midnight.  Place on a Protonix drip.  IV fluids.  Supportive care.  Transfusion threshold hemoglobin less  than 7.  [facility] has been consulted and patient will be seen in formal consultation tomorrow 10/30/2017 and it is anticipated the patient will undergo an upper endoscopy.  Follow.  2.  Acute blood loss anemia Secondary to problem #1.  Check serial CBCs.  Transfusion threshold hemoglobin less than 7.  3.  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  Check a CA-19-9.  GI consulted for further evaluation and management.  4.  Dehydration IV fluids.  5.  Leukocytosis Questionable etiology.  Check a UA with cultures and sensitivities.  Check a chest x-ray.  Monitor for now.  If urinalysis is worrisome for UTI will place on empiric IV antibiotics.  If chest x-ray is worrisome for an infiltrate will place empirically on IV antibiotics.  Follow for now.  6.  Diabetes mellitus type 2 Check a hemoglobin A1c.  Place on sliding scale insulin.  Hold oral hypoglycemic agents.  Follow.  DVT prophylaxis: SCDs Code Status: Full Family Communication: Updated patient.  No family at bedside. Disposition Plan: Likely home once upper GI bleed and pancreatic mass with metastatic disease has been assessed and evaluated and per gastroenterology. Consults called: Gastroenterology:[place] Admission status: Admit to inpatient.  Telemetry.   Irine Seal MD [facility] Pager [phone]  If 7PM-7AM, please contact night-coverage [email or web address] Password [facility]  10/29/2017, 5:21 PM

## 2017-10-29 NOTE — Patient Instructions (Addendum)
Unfortunately your blood count does indicate anemia, With your occasional abdominal pain, lightheadedness, and dark stools. I am suspicious for possible ulcer or bleeding into the intestines. That may explain your symptoms over the past week. Further evaluation will need to be done to the emergency room and possible hospital admission. Proceed to [facility] emergency room after leaving here. If you have any worsening symptoms on the way to the emergency room, pull over and call 911.  PLease discuss the chest pain symptoms with the providers I the ER/hospital as well, but we may need to refer you to cardiology for further evaluation. Follow up with me after the ER or hospital.   I will check your A1c, no changes in diabetes meds for now.   Return to the clinic or go to the nearest emergency room if any of your symptoms worsen or new symptoms occur.     IF you received an x-ray today, you will receive an invoice from [facility]. Please contact [facility] at [phone] with questions or concerns regarding your invoice.   IF you received labwork today, you will receive an invoice from [facility]. Please contact [facility] at [phone] with questions or concerns regarding your invoice.   Our billing staff will not be able to assist you with questions regarding bills from these companies.  You will be contacted with the lab results as soon as they are available. The fastest way to get your results is to activate your My Chart account. Instructions are located on the last page of this paperwork. If you have not heard from [place] regarding the results in 2 weeks, please contact this office.

## 2017-10-29 NOTE — ED Notes (Signed)
PCP Dr Nyoka Cowden from [facility], states saw patient for a follow-up-states Hgb was 8.1-no overt signs of a bleed-was initially positive for orthostatics

## 2017-10-30 ENCOUNTER — Inpatient Hospital Stay (HOSPITAL_COMMUNITY): Payer: BLUE CROSS/BLUE SHIELD

## 2017-10-30 ENCOUNTER — Inpatient Hospital Stay (HOSPITAL_COMMUNITY): Payer: BLUE CROSS/BLUE SHIELD | Admitting: Anesthesiology

## 2017-10-30 ENCOUNTER — Encounter (HOSPITAL_COMMUNITY): Payer: Self-pay | Admitting: *Deleted

## 2017-10-30 ENCOUNTER — Encounter (HOSPITAL_COMMUNITY)
Admission: EM | Disposition: A | Discharge status: Home / Self Care | Payer: Self-pay | Source: Home / Self Care | Attending: Family Medicine

## 2017-10-30 DIAGNOSIS — R1084 Generalized abdominal pain: Secondary | ICD-10-CM

## 2017-10-30 DIAGNOSIS — C787 Secondary malignant neoplasm of liver and intrahepatic bile duct: Secondary | ICD-10-CM

## 2017-10-30 DIAGNOSIS — K922 Gastrointestinal hemorrhage, unspecified: Secondary | ICD-10-CM

## 2017-10-30 DIAGNOSIS — K3189 Other diseases of stomach and duodenum: Secondary | ICD-10-CM

## 2017-10-30 DIAGNOSIS — R634 Abnormal weight loss: Secondary | ICD-10-CM

## 2017-10-30 DIAGNOSIS — K208 Other esophagitis: Secondary | ICD-10-CM

## 2017-10-30 DIAGNOSIS — C170 Malignant neoplasm of duodenum: Secondary | ICD-10-CM

## 2017-10-30 DIAGNOSIS — R935 Abnormal findings on diagnostic imaging of other abdominal regions, including retroperitoneum: Secondary | ICD-10-CM

## 2017-10-30 HISTORY — PX: IR US GUIDE BX ASP/DRAIN: IMG2392

## 2017-10-30 HISTORY — PX: ESOPHAGOGASTRODUODENOSCOPY (EGD) WITH PROPOFOL: SHX5813

## 2017-10-30 LAB — CBC WITH DIFFERENTIAL/PLATELET
BASOS ABS: 0 10*3/uL (ref 0.0–0.1)
BASOS PCT: 0 %
Basophils Absolute: 0 10*3/uL (ref 0.0–0.1)
Basophils Absolute: 0 10*3/uL (ref 0.0–0.1)
Basophils Relative: 0 %
Basophils Relative: 0 %
EOS ABS: 0.1 10*3/uL (ref 0.0–0.7)
EOS ABS: 0.1 10*3/uL (ref 0.0–0.7)
EOS PCT: 1 %
EOS PCT: 1 %
Eosinophils Absolute: 0.1 10*3/uL (ref 0.0–0.7)
Eosinophils Relative: 1 %
HCT: 22 % — ABNORMAL LOW (ref 39.0–52.0)
HCT: 24 % — ABNORMAL LOW (ref 39.0–52.0)
HEMATOCRIT: 23.9 % — AB (ref 39.0–52.0)
HEMOGLOBIN: 7 g/dL — AB (ref 13.0–17.0)
HEMOGLOBIN: 7.6 g/dL — AB (ref 13.0–17.0)
Hemoglobin: 7.7 g/dL — ABNORMAL LOW (ref 13.0–17.0)
LYMPHS ABS: 1.3 10*3/uL (ref 0.7–4.0)
LYMPHS ABS: 1 10*3/uL (ref 0.7–4.0)
LYMPHS PCT: 15 %
Lymphocytes Relative: 13 %
Lymphocytes Relative: 12 %
Lymphs Abs: 1.1 10*3/uL (ref 0.7–4.0)
MCH: 26.4 pg (ref 26.0–34.0)
MCH: 26.4 pg (ref 26.0–34.0)
MCH: 26.3 pg (ref 26.0–34.0)
MCHC: 31.8 g/dL (ref 30.0–36.0)
MCHC: 32.1 g/dL (ref 30.0–36.0)
MCHC: 31.8 g/dL (ref 30.0–36.0)
MCV: 83 fL (ref 78.0–100.0)
MCV: 82.2 fL (ref 78.0–100.0)
MCV: 82.7 fL (ref 78.0–100.0)
MONO ABS: 0.5 10*3/uL (ref 0.1–1.0)
Monocytes Absolute: 0.5 10*3/uL (ref 0.1–1.0)
Monocytes Absolute: 0.5 10*3/uL (ref 0.1–1.0)
Monocytes Relative: 6 %
Monocytes Relative: 6 %
Monocytes Relative: 6 %
NEUTROS ABS: 6.8 10*3/uL (ref 1.7–7.7)
NEUTROS ABS: 6.5 10*3/uL (ref 1.7–7.7)
NEUTROS PCT: 78 %
NEUTROS PCT: 81 %
Neutro Abs: 7 10*3/uL (ref 1.7–7.7)
Neutrophils Relative %: 80 %
PLATELETS: 260 10*3/uL (ref 150–400)
PLATELETS: 270 10*3/uL (ref 150–400)
Platelets: 262 10*3/uL (ref 150–400)
RBC: 2.65 MIL/uL — AB (ref 4.22–5.81)
RBC: 2.92 MIL/uL — ABNORMAL LOW (ref 4.22–5.81)
RBC: 2.89 MIL/uL — AB (ref 4.22–5.81)
RDW: 14.8 % (ref 11.5–15.5)
RDW: 14.5 % (ref 11.5–15.5)
RDW: 14.5 % (ref 11.5–15.5)
WBC: 8.7 10*3/uL (ref 4.0–10.5)
WBC: 8.6 10*3/uL (ref 4.0–10.5)
WBC: 8 10*3/uL (ref 4.0–10.5)

## 2017-10-30 LAB — COMPREHENSIVE METABOLIC PANEL
ALK PHOS: 96 U/L (ref 38–126)
ALT: 20 U/L (ref 17–63)
ALT: 18 U/L (ref 17–63)
AST: 24 U/L (ref 15–41)
AST: 21 U/L (ref 15–41)
Albumin: 2.8 g/dL — ABNORMAL LOW (ref 3.5–5.0)
Albumin: 2.8 g/dL — ABNORMAL LOW (ref 3.5–5.0)
Alkaline Phosphatase: 93 U/L (ref 38–126)
Anion gap: 7 (ref 5–15)
Anion gap: 8 (ref 5–15)
BILIRUBIN TOTAL: 1 mg/dL (ref 0.3–1.2)
BUN: 22 mg/dL — ABNORMAL HIGH (ref 6–20)
BUN: 17 mg/dL (ref 6–20)
CALCIUM: 8.7 mg/dL — AB (ref 8.9–10.3)
CHLORIDE: 107 mmol/L (ref 101–111)
CO2: 21 mmol/L — ABNORMAL LOW (ref 22–32)
CO2: 21 mmol/L — ABNORMAL LOW (ref 22–32)
CREATININE: 0.94 mg/dL (ref 0.61–1.24)
CREATININE: 0.77 mg/dL (ref 0.61–1.24)
Calcium: 8.8 mg/dL — ABNORMAL LOW (ref 8.9–10.3)
Chloride: 109 mmol/L (ref 101–111)
GFR calc Af Amer: >60 mL/min (ref >60)
Glucose, Bld: 104 mg/dL — ABNORMAL HIGH (ref 65–99)
Glucose, Bld: 107 mg/dL — ABNORMAL HIGH (ref 65–99)
Potassium: 5 mmol/L (ref 3.5–5.1)
Potassium: 4.5 mmol/L (ref 3.5–5.1)
Sodium: 135 mmol/L (ref 135–145)
Sodium: 138 mmol/L (ref 135–145)
Total Bilirubin: 0.4 mg/dL (ref 0.3–1.2)
Total Protein: 6.2 g/dL — ABNORMAL LOW (ref 6.5–8.1)
Total Protein: 6.2 g/dL — ABNORMAL LOW (ref 6.5–8.1)

## 2017-10-30 LAB — URINALYSIS, ROUTINE W REFLEX MICROSCOPIC
Bilirubin Urine: NEGATIVE
Glucose, UA: NEGATIVE mg/dL
Hgb urine dipstick: NEGATIVE
Ketones, ur: 5 mg/dL — AB
LEUKOCYTES UA: NEGATIVE
NITRITE: NEGATIVE
PH: 5 (ref 5.0–8.0)
Protein, ur: NEGATIVE mg/dL
SPECIFIC GRAVITY, URINE: 1.012 (ref 1.005–1.030)

## 2017-10-30 LAB — GLUCOSE, CAPILLARY
Glucose-Capillary: 109 mg/dL — ABNORMAL HIGH (ref 65–99)
Glucose-Capillary: 91 mg/dL (ref 65–99)
Glucose-Capillary: 95 mg/dL (ref 65–99)
Glucose-Capillary: 99 mg/dL (ref 65–99)

## 2017-10-30 LAB — PREPARE RBC (CROSSMATCH)

## 2017-10-30 LAB — FOLATE: FOLATE: 17 ng/mL (ref >5.9)

## 2017-10-30 LAB — PROTIME-INR
INR: 1.09
PROTHROMBIN TIME: 14 s (ref 11.4–15.2)

## 2017-10-30 LAB — IRON AND TIBC
Iron: 19 ug/dL — ABNORMAL LOW (ref 45–182)
SATURATION RATIOS: 5 % — AB (ref 17.9–39.5)
TIBC: 382 ug/dL (ref 250–450)
UIBC: 363 ug/dL

## 2017-10-30 LAB — HIV ANTIBODY (ROUTINE TESTING W REFLEX): HIV SCREEN 4TH GENERATION: NONREACTIVE

## 2017-10-30 LAB — HEMOGLOBIN A1C
ESTIMATED AVERAGE GLUCOSE: 146 mg/dL
HEMOGLOBIN A1C: 6.7 % — AB (ref 4.8–5.6)

## 2017-10-30 LAB — FERRITIN: Ferritin: 92 ng/mL (ref 24–336)

## 2017-10-30 LAB — VITAMIN B12: VITAMIN B 12: 1499 pg/mL — AB (ref 180–914)

## 2017-10-30 LAB — CANCER ANTIGEN 19-9: CA 19-9: 6905 U/mL — ABNORMAL HIGH (ref 0–35)

## 2017-10-30 SURGERY — ESOPHAGOGASTRODUODENOSCOPY (EGD) WITH PROPOFOL
Anesthesia: Monitor Anesthesia Care

## 2017-10-30 MED ORDER — LIDOCAINE 2% (20 MG/ML) 5 ML SYRINGE
INTRAMUSCULAR | Status: DC | PRN
  Administered 2017-10-30: 100 mg via INTRAVENOUS

## 2017-10-30 MED ORDER — PROPOFOL 10 MG/ML IV BOLUS
INTRAVENOUS | Status: DC | PRN
  Administered 2017-10-30 (×3): 20 mg via INTRAVENOUS

## 2017-10-30 MED ORDER — PROPOFOL 500 MG/50ML IV EMUL
INTRAVENOUS | Status: DC | PRN
  Administered 2017-10-30: 140 ug/kg/min via INTRAVENOUS

## 2017-10-30 MED ORDER — SODIUM CHLORIDE 0.9 % IV SOLN: INTRAVENOUS | Status: DC

## 2017-10-30 MED ORDER — SODIUM CHLORIDE 0.9 % IV BOLUS (SEPSIS)
500.0000 mL | Freq: Once | INTRAVENOUS | Status: AC
  Administered 2017-10-30: 500 mL via INTRAVENOUS

## 2017-10-30 MED ORDER — LIDOCAINE 2% (20 MG/ML) 5 ML SYRINGE
INTRAMUSCULAR | Status: AC
  Filled 2017-10-30: qty 5

## 2017-10-30 MED ORDER — PHENYLEPHRINE 40 MCG/ML (10ML) SYRINGE FOR IV PUSH (FOR BLOOD PRESSURE SUPPORT)
PREFILLED_SYRINGE | INTRAVENOUS | Status: AC
  Filled 2017-10-30: qty 10

## 2017-10-30 MED ORDER — MIDAZOLAM HCL 2 MG/2ML IJ SOLN
INTRAMUSCULAR | Status: AC
  Filled 2017-10-30: qty 4

## 2017-10-30 MED ORDER — PROPOFOL 10 MG/ML IV BOLUS
INTRAVENOUS | Status: AC
  Filled 2017-10-30: qty 60

## 2017-10-30 MED ORDER — LIDOCAINE-EPINEPHRINE (PF) 2 %-1:200000 IJ SOLN
INTRAMUSCULAR | Status: AC | PRN
  Administered 2017-10-30: 10 mL

## 2017-10-30 MED ORDER — LIDOCAINE-EPINEPHRINE (PF) 2 %-1:200000 IJ SOLN
INTRAMUSCULAR | Status: AC
  Filled 2017-10-30: qty 20

## 2017-10-30 MED ORDER — SODIUM CHLORIDE 0.9 % IV SOLN
Freq: Once | INTRAVENOUS | Status: AC
  Administered 2017-10-30: 04:00:00 via INTRAVENOUS

## 2017-10-30 MED ORDER — FENTANYL CITRATE (PF) 100 MCG/2ML IJ SOLN
INTRAMUSCULAR | Status: AC
  Filled 2017-10-30: qty 4

## 2017-10-30 MED ORDER — MIDAZOLAM HCL 2 MG/2ML IJ SOLN
INTRAMUSCULAR | Status: AC | PRN
  Administered 2017-10-30 (×2): 1 mg via INTRAVENOUS

## 2017-10-30 MED ORDER — FENTANYL CITRATE (PF) 100 MCG/2ML IJ SOLN
INTRAMUSCULAR | Status: AC | PRN
  Administered 2017-10-30: 25 ug via INTRAVENOUS
  Administered 2017-10-30: 50 ug via INTRAVENOUS

## 2017-10-30 MED ORDER — PHENYLEPHRINE 40 MCG/ML (10ML) SYRINGE FOR IV PUSH (FOR BLOOD PRESSURE SUPPORT)
PREFILLED_SYRINGE | INTRAVENOUS | Status: DC | PRN
  Administered 2017-10-30 (×4): 80 ug via INTRAVENOUS

## 2017-10-30 MED ORDER — GELATIN ABSORBABLE 12-7 MM EX MISC
CUTANEOUS | Status: AC
  Administered 2017-10-30: 17:00:00 via INTRADERMAL
  Filled 2017-10-30: qty 1

## 2017-10-30 SURGICAL SUPPLY — 15 items

## 2017-10-30 NOTE — Anesthesia Postprocedure Evaluation (Signed)
Anesthesia Post Note  Patient: [name]  Procedure(s) Performed: ESOPHAGOGASTRODUODENOSCOPY (EGD) WITH PROPOFOL (N/A )     Patient location during evaluation: PACU Anesthesia Type: MAC Level of consciousness: awake and alert Pain management: pain level controlled Vital Signs Assessment: post-procedure vital signs reviewed and stable Respiratory status: spontaneous breathing, nonlabored ventilation, respiratory function stable and patient connected to nasal cannula oxygen Cardiovascular status: stable and blood pressure returned to baseline Postop Assessment: no apparent nausea or vomiting Anesthetic complications: no    Last Vitals:  Vitals:   10/30/17 1200 10/30/17 1219  BP: 109/67 111/89  Pulse: 95 98  Resp: 20 20  Temp:  37.1 C  SpO2: 95% 98%    Last Pain:  Vitals:   10/30/17 1219  TempSrc: Oral  PainSc:                  Linkin Vizzini S

## 2017-10-30 NOTE — Consult Note (Signed)
Chief Complaint: Patient was seen in consultation today for image guided liver lesion biopsy Chief Complaint  Patient presents with  . dark stool  . Fatigue    Referring Physician(s): Nandigam,K  Supervising Physician: Sandi Mariscal  Patient Status: [facility] - In-pt  History of Present Illness: [name] is a 56 y.o. male recently admitted with history of anemia, fatigue, dizziness, weight  loss, upper abdominal pain and black stools.  He has no personal history of cancer but first degree family history of melanoma, ovarian, lung  and prostate cancers as well as BRCA2 positivity. CT scan on 10/29/17 revealed changes consistent with hepatic metastatic disease and likely primary pancreatic mass in region of head of the pancreas.  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  Request now received from GI service for image guided liver lesion biopsy.  Past Medical History:  Diagnosis Date  . BRCA2 positive   . Diabetes mellitus 2008  . Family history of BRCA2 gene positive   . Family history of ovarian cancer   . Family history of prostate cancer   . Fatty liver 2008   Fatty liver, saw GI    . Hyperlipidemia   . Hypertension     Past Surgical History:  Procedure Laterality Date  . NO PAST SURGERIES      Allergies: Patient has no known allergies.  Medications: Prior to Admission medications   Medication Sig Start Date End Date Taking? Authorizing Provider  aspirin 325 MG tablet Take 325 mg by mouth daily.     Yes [provider]  atorvastatin (LIPITOR) 10 MG tablet Take 1 tablet (10 mg total) by mouth at bedtime. 07/18/17  Yes [name], MD  fish oil-omega-3 fatty acids 1000 MG capsule Take 4 g by mouth daily.    Yes [provider]  lisinopril-hydrochlorothiazide (PRINZIDE,ZESTORETIC) 10-12.5 MG tablet Take 1 tablet by mouth daily. 07/18/17  Yes [name], MD    metFORMIN (GLUCOPHAGE) 500 MG tablet Take 1 tablet (500 mg total) by mouth 2 (two) times daily with a meal. Patient taking differently: Take 500-1,000 mg by mouth 2 (two) times daily with a meal. 2 TABLETS IN THE AM AND 1 TABLET IN THE EVENING 07/18/17  Yes [name], MD  multivitamin-iron-minerals-folic acid (CENTRUM) chewable tablet Chew 1 tablet by mouth daily.     Yes [provider]  naproxen sodium (ALEVE) 220 MG tablet Take 220 mg by mouth daily as needed (PAIN).   Yes [provider]  blood glucose meter kit and supplies Dispense based on patient and insurance preference. Use up to four times daily as directed. (FOR ICD-9 250.00, 250.01). 07/04/15   [name], MD  glucose blood test strip Use to test blood sugar up to 4 times daily as directed. Dx code: E11.9 07/07/15   [name], MD     Family History  Problem Relation Age of Onset  . Diabetes Father   . Lung cancer Father        smoker  . Stroke Mother   . Ovarian cancer Sister 79  . BRCA 1/2 Sister        BRCA2 pos  . Leukemia Maternal Aunt   . Stomach cancer Maternal Grandfather   . Prostate cancer Paternal Grandfather   . Melanoma Sister   . BRCA 1/2 Sister        BRCA2 negative  . Leukemia Cousin  maternal first cousin  . Heart attack Neg Hx   . Colon cancer Neg Hx     Social History   Socioeconomic History  . Marital status: Single    Spouse name: None  . Number of children: 0  . Years of education: None  . Highest education level: None  Social Needs  . Financial resource strain: None  . Food insecurity - worry: None  . Food insecurity - inability: None  . Transportation needs - medical: None  . Transportation needs - non-medical: None  Occupational History  . Occupation: [employer]    Employer: [employer]  Tobacco Use  . Smoking status: Never Smoker  . Smokeless tobacco: Never Used  Substance and Sexual Activity  . Alcohol use: No  . Drug use: No  .  Sexual activity: None  Other Topics Concern  . None  Social History Narrative   Lives by himself                   Review of Systems see above; currently denies fever, headache, chest pain, dyspnea, cough, abdominal/back pain, nausea, vomiting  Vital Signs: BP 111/89 (BP Location: Left Arm)   Pulse 98   Temp 98.8 F (37.1 C) (Oral)   Resp 20   Ht 5' 6"  (1.676 m)   Wt 169 lb 9.6 oz (76.9 kg)   SpO2 98%   BMI 27.37 kg/m   Physical Exam awake, alert.  Pale appearing.  Chest clear to auscultation bilaterally.  Heart with regular rate and rhythm.  Abdomen soft, positive bowel sounds, nontender.  No lower extremity edema  Imaging: Dg Chest 2 View  Result Date: 10/29/2017 CLINICAL DATA:  Hypotension. EXAM: CHEST  2 VIEW COMPARISON:  None. FINDINGS: The heart size and mediastinal contours are within normal limits. Both lungs are clear. The visualized skeletal structures are unremarkable. IMPRESSION: No active cardiopulmonary disease. Electronically Signed   By: Ashley Royalty M.D.   On: 10/29/2017 17:56   Ct Abdomen Pelvis W Contrast  Result Date: 10/29/2017 CLINICAL DATA:  Fatigue for 1 week and epigastric pain with weight loss EXAM: CT ABDOMEN AND PELVIS WITH CONTRAST TECHNIQUE: Multidetector CT imaging of the abdomen and pelvis was performed using the standard protocol following bolus administration of intravenous contrast. CONTRAST:  14m ISOVUE-300 IOPAMIDOL (ISOVUE-300) INJECTION 61% COMPARISON:  None. FINDINGS: Lower chest: Lung bases are free of acute infiltrate or sizable effusion. Hepatobiliary: The gallbladder is within normal limits. The liver demonstrates multiple hypodense lesions with peripheral enhancement most consistent with metastatic disease. These do not demonstrate significant enhancement on delayed images. The largest of these in the left lobe of the liver measures approximately 5.1 cm. The largest of these in the right lobe of the liver measures approximately 5.8  cm. No biliary ductal dilatation is seen. Pancreas: The body and tail of the pancreas are within normal limits. In the region of the pancreatic head however in the head as prominent with suggestion of underlying mass lesion. This would correspond with the changes seen in the liver. There is suggestion of mass lesion measures approximately 5 cm in greatest dimension. Some associated adjacent necrotic appearing lymph nodes are seen in the region of the celiac axis (15 mm) as well as in the periaortic region and mesentery (12 mm). Peripancreatic (2.1 cm) and gastrohepatic (11 mm) adenopathy is noted as well. An adjacent duodenal diverticulum is seen with both air and fluid within. Spleen: Normal in size without focal abnormality. Adrenals/Urinary Tract: The adrenal  glands are within normal limits. The kidneys demonstrates no obstructive change. Large nonobstructing right renal stone is seen measuring approximately 10 mm. The bladder is partially distended. Stomach/Bowel: Bilateral inguinal hernias are identified with loops of small bowel within the right inguinal hernia and loops of sigmoid colon in the left inguinal hernia. No obstructive changes are seen. The appendix is within normal limits. Scattered diverticular change is noted in the colon without diverticulitis. Large duodenal diverticulum is noted adjacent to the head of the pancreas. Vascular/Lymphatic: Atherosclerotic calcifications are identified. Additionally there are changes of lymphadenopathy along the right common iliac artery measuring approximately 12 mm in short axis. Periaortic and intra-aortocaval lymph nodes are seen as well as the previously described changes in the region of the gastrohepatic ligament as well as the peripancreatic region. Some of these nodes are intimately associated with suggested pancreatic head mass. The largest of these measures approximately 2.1 cm in short axis with central necrosis. Reproductive: Prostate is unremarkable.  Other: No abdominal wall hernia or abnormality. No abdominopelvic ascites. Musculoskeletal: No acute or significant osseous findings. IMPRESSION: Changes consistent with hepatic metastatic disease and likely primary pancreatic mass in the region of the head of the pancreas. Surrounding associated lymphadenopathy is noted as described. Tissue sampling of the liver lesions is recommended. Additional workup for distant metastatic disease is recommended as well. Nonobstructing right renal stone These results were called by telephone at the time of interpretation on 10/29/2017 at 2:52 pm to [facility] , who verbally acknowledged these results. Electronically Signed   By: Inez Catalina M.D.   On: 10/29/2017 14:56    Labs:  CBC: Recent Labs    10/29/17 1337 10/29/17 1818 10/30/17 0106 10/30/17 0921  WBC 13.5* 11.7* 8.7 8.6  HGB 7.8* 7.6* 7.0* 7.7*  HCT 25.0* 24.4* 22.0* 24.0*  PLT 356 335 260 270    COAGS: Recent Labs    10/30/17 0106  INR 1.09    BMP: Recent Labs    10/29/17 1151 10/29/17 1337 10/30/17 0106 10/30/17 0921  NA 134* 133* 135 138  K 6.0* 5.1 5.0 4.5  CL 102 101 107 109  CO2 18* 23 21* 21*  GLUCOSE 119* 111* 104* 107*  BUN 36* 36* 22* 17  CALCIUM 9.7 9.5 8.7* 8.8*  CREATININE 1.18 1.17 0.94 0.77  GFRNONAA >60 >60 >60 >60  GFRAA >60 >60 >60 >60    LIVER FUNCTION TESTS: Recent Labs    07/18/17 1030 10/29/17 1151 10/30/17 0106 10/30/17 0921  BILITOT 0.6 0.8 0.4 1.0  AST 54* 44* 24 21  ALT 71* 23 20 18   ALKPHOS 87 113 96 93  PROT 6.9 6.9 6.2* 6.2*  ALBUMIN 4.5 3.2* 2.8* 2.8*    TUMOR MARKERS: No results for input(s): AFPTM, CEA, CA199, CHROMGRNA in the last 8760 hours.  Assessment and Plan: 56 y.o. male recently admitted with history of anemia, fatigue, dizziness, weight  loss, upper abdominal pain and black stools.  He has no personal history of cancer but first degree family history of melanoma, ovarian, lung  and prostate cancers as well as  BRCA2 positivity. CT scan on 10/29/17 revealed changes consistent with hepatic metastatic disease and likely primary pancreatic mass in region of head of the pancreas.  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  Request now received from GI service for image guided liver lesion biopsy.  Latest labs include creatinine 0.77, potassium 4.5, WBC 8.6, hemoglobin 7.7,plts 270k, PT 14,  INR 1.09.  Imaging studies have been reviewed by Dr. Pascal Lux. Risks and benefits discussed with the patient including, but not limited to bleeding, infection, damage to adjacent structures or low yield requiring additional tests. All of the patient's questions were answered, patient is agreeable to proceed.Consent signed and in chart. Procedure tent planned for later this afternoon.      Thank you for this interesting consult.  I greatly enjoyed meeting Khaalid Lefkowitz and look forward to participating in their care.  A copy of this report was sent to the requesting provider on this date.  Electronically Signed: D. Rowe Robert, PA-C 10/30/2017, 2:34 PM   I spent a total of 30 minutes  in face to face in clinical consultation, greater than 50% of which was counseling/coordinating care for image guided liver lesion biopsy

## 2017-10-30 NOTE — Progress Notes (Signed)
PROGRESS NOTE  [name]  JZP:[id number] DOB: 10/15/1961 DOA: 10/29/2017 PCP: [name], MD   Brief Narrative: [name] is a 56 y.o. male with medical history significant of hypertension, diabetes, family history of ovarian and prostate cancer, family history positive for BRCA2 gene, hyperlipidemia who presents to the ED with a 1-1/2-week history of upper abdominal pain occasionally radiating to his back, dizziness, lightheadedness, black tarry stools, generalized weakness.  Patient also endorses a 20 pound weight loss over the past 2 months with a decreased appetite. Patient had presented to his PCPs office or lab work which was obtained showed a new anemia with a hemoglobin of 8.1. He was sent to the ED where labs showed a white count of 13.5, hemoglobin of 7.8, trending down to 7.0, platelets of 356 otherwise was within normal limits.  Basic metabolic profile obtained had a sodium of 133 glucose of 111 BUN of 36 creatinine of 1.17. CT abdomen and pelvis demonstrated a pancreatic mass and multiple hepatic lesions. 1u PRBCs given with improvement to hgb 7.7g/dl. EGD 11/21 demonstrated a necrotic duodenal mass with adherent clot and bleeding and erosive esophagitis. IR was consulted for biopsy of hepatic lesion, performed 11/21.   Assessment & Plan: Principal Problem:   GI bleed Active Problems:   Diabetes type 2, controlled (HCC)   Hypertension   Symptomatic anemia   Acute blood loss anemia   Pancreatic mass: Per CT abd/pelvis 10/29/2017   Dehydration   Leukocytosis   Metastases to the liver [facility])  Acute GI bleed with acute blood loss anemia: Due to necrotic duodenal mass noted on EGD.  - Serial CBC, tranfuse for hgb <7g/dl - Continue IV PPI - Monitor clinically for bleeding.  - Surgery consulted for consideration of palliative resection   Pancreatic mass with metastatic lesion on liver: Strong suspicion for malignancy.  - Await final pathology results from EGD  and liver lesion biopsy on 11/21. - CA 19-9 pending  Dehydration:  - Continue IVF's, ok to start clear liquids per GI, Dr. Fuller Plan.   T2DM:  - Check a hemoglobin A1c.   - Place on sliding scale insulin.   - Hold oral hypoglycemic agents.   DVT prophylaxis: SCDs Code Status: Full Family Communication: Sister, brother-in-law, niece at bedside Disposition Plan: Uncertain  Consultants:   [facility]  IR  Surgery  Procedures:   10/30/2017: Technically successful US guided biopsy of indeterminate mass within the right lobe of the liver by Dr. Pascal Lux.   EGD 10/30/2017 by Dr. Silverio Decamp: - LA Grade C (one or more mucosal breaks continuous between tops of 2 or more mucosal folds, less than 75% circumference) esophagitis with no bleeding was found 34 to 38 cm from the incisors. - The stomach was normal. - A large infiltrative and ulcerated mass with bleeding, large adherent clot, involving >50 % luminal surface area 5-7cm in length was found in the second portion of the duodenum. Biopsies were taken with a coldforceps for histology. Not amenable to endoscopic intervention to control the bleeding.  Antimicrobials:  None   Subjective: Feeling stable after biopsy, tired and hungry. No melena currently.Lightheadedness has resolved since transfusion today.  Objective: Vitals:   10/30/17 1645 10/30/17 1650 10/30/17 1656 10/30/17 1712  BP: 106/63 106/66 101/63 113/61  Pulse: 100 (!) 101 94   Resp: 15 20 16 18   Temp:    99 F (37.2 C)  TempSrc:    Oral  SpO2: 100% 99% 98% 96%  Weight:  Height:        Intake/Output Summary (Last 24 hours) at 10/30/2017 1753 Last data filed at 10/30/2017 1500 Gross per 24 hour  Intake 1962 ml  Output 175 ml  Net 1787 ml   Filed Weights   10/29/17 1820  Weight: 76.9 kg (169 lb 9.6 oz)    Gen: 56 y.o. male in no distress  Pulm: Non-labored breathing room air. Clear to auscultation bilaterally.  CV: Regular rate and rhythm. No murmur, rub,  or gallop. No JVD, no pedal edema. GI: Abdomen soft, mild epigastric tenderness without masses palpated, non-distended, with normoactive bowel sounds. No organomegaly or masses felt. Ext: Warm, no deformities Skin: Diffuse pallor, otherwise no rashes or wounds.  Neuro: Alert and oriented. No focal neurological deficits. Psych: Judgement and insight appear normal. Mood & affect appropriate.   Data Reviewed: I have personally reviewed following labs and imaging studies  CBC: Recent Labs  Lab 10/29/17 0926 10/29/17 1337 10/29/17 1818 10/30/17 0106 10/30/17 0921  WBC 13.0* 13.5* 11.7* 8.7 8.6  NEUTROABS  --   --  9.5* 6.8 7.0  HGB 8.1* 7.8* 7.6* 7.0* 7.7*  HCT 25.6* 25.0* 24.4* 22.0* 24.0*  MCV 81.7 82.5 83.8 83.0 82.2  PLT  --  356 335 260 174   Basic Metabolic Panel: Recent Labs  Lab 10/29/17 1151 10/29/17 1337 10/29/17 1818 10/30/17 0106 10/30/17 0921  NA 134* 133*  --  135 138  K 6.0* 5.1  --  5.0 4.5  CL 102 101  --  107 109  CO2 18* 23  --  21* 21*  GLUCOSE 119* 111*  --  104* 107*  BUN 36* 36*  --  22* 17  CREATININE 1.18 1.17  --  0.94 0.77  CALCIUM 9.7 9.5  --  8.7* 8.8*  MG  --   --  2.0  --   --    GFR: Estimated Creatinine Clearance: 101.8 mL/min (by C-G formula based on SCr of 0.77 mg/dL). Liver Function Tests: Recent Labs  Lab 10/29/17 1151 10/30/17 0106 10/30/17 0921  AST 44* 24 21  ALT 23 20 18   ALKPHOS 113 96 93  BILITOT 0.8 0.4 1.0  PROT 6.9 6.2* 6.2*  ALBUMIN 3.2* 2.8* 2.8*   Recent Labs  Lab 10/29/17 1337  LIPASE 45   No results for input(s): AMMONIA in the last 168 hours. Coagulation Profile: Recent Labs  Lab 10/30/17 0106  INR 1.09   Cardiac Enzymes: Recent Labs  Lab 10/29/17 1337  TROPONINI <0.03   BNP (last 3 results) No results for input(s): PROBNP in the last 8760 hours. HbA1C: Recent Labs    10/29/17 1117 10/29/17 1818  HGBA1C 6.7* 6.6*   CBG: Recent Labs  Lab 10/29/17 2201 10/30/17 0750 10/30/17 1218    GLUCAP 115* 109* 91   Lipid Profile: No results for input(s): CHOL, HDL, LDLCALC, TRIG, CHOLHDL, LDLDIRECT in the last 72 hours. Thyroid Function Tests: No results for input(s): TSH, T4TOTAL, FREET4, T3FREE, THYROIDAB in the last 72 hours. Anemia Panel: Recent Labs    10/29/17 1818  VITAMINB12 1,499*  FOLATE 17.0  FERRITIN 92  TIBC 382  IRON 19*  RETICCTPCT 2.4   Urine analysis:    Component Value Date/Time   COLORURINE STRAW (A) 10/30/2017 [place] 10/30/2017 0434   LABSPEC 1.012 10/30/2017 [place] 5.0 10/30/2017 [place] 10/30/2017 [place] 10/30/2017 [place] 10/30/2017 0434   [place] Stain  5 (A) 10/30/2017 0434   PROTEINUR NEGATIVE 10/30/2017 0434   NITRITE NEGATIVE 10/30/2017 [place] 10/30/2017 0434   No results found for this or any previous visit (from the past 240 hour(s)).    Radiology Studies: Dg Chest 2 View  Result Date: 10/29/2017 CLINICAL DATA:  Hypotension. EXAM: CHEST  2 VIEW COMPARISON:  None. FINDINGS: The heart size and mediastinal contours are within normal limits. Both lungs are clear. The visualized skeletal structures are unremarkable. IMPRESSION: No active cardiopulmonary disease. Electronically Signed   By: Ashley Royalty M.D.   On: 10/29/2017 17:56   Ct Abdomen Pelvis W Contrast  Result Date: 10/29/2017 CLINICAL DATA:  Fatigue for 1 week and epigastric pain with weight loss EXAM: CT ABDOMEN AND PELVIS WITH CONTRAST TECHNIQUE: Multidetector CT imaging of the abdomen and pelvis was performed using the standard protocol following bolus administration of intravenous contrast. CONTRAST:  168m ISOVUE-300 IOPAMIDOL (ISOVUE-300) INJECTION 61% COMPARISON:  None. FINDINGS: Lower chest: Lung bases are free of acute infiltrate or sizable effusion. Hepatobiliary: The gallbladder is within normal limits. The liver demonstrates multiple hypodense lesions with peripheral  enhancement most consistent with metastatic disease. These do not demonstrate significant enhancement on delayed images. The largest of these in the left lobe of the liver measures approximately 5.1 cm. The largest of these in the right lobe of the liver measures approximately 5.8 cm. No biliary ductal dilatation is seen. Pancreas: The body and tail of the pancreas are within normal limits. In the region of the pancreatic head however in the head as prominent with suggestion of underlying mass lesion. This would correspond with the changes seen in the liver. There is suggestion of mass lesion measures approximately 5 cm in greatest dimension. Some associated adjacent necrotic appearing lymph nodes are seen in the region of the celiac axis (15 mm) as well as in the periaortic region and mesentery (12 mm). Peripancreatic (2.1 cm) and gastrohepatic (11 mm) adenopathy is noted as well. An adjacent duodenal diverticulum is seen with both air and fluid within. Spleen: Normal in size without focal abnormality. Adrenals/Urinary Tract: The adrenal glands are within normal limits. The kidneys demonstrates no obstructive change. Large nonobstructing right renal stone is seen measuring approximately 10 mm. The bladder is partially distended. Stomach/Bowel: Bilateral inguinal hernias are identified with loops of small bowel within the right inguinal hernia and loops of sigmoid colon in the left inguinal hernia. No obstructive changes are seen. The appendix is within normal limits. Scattered diverticular change is noted in the colon without diverticulitis. Large duodenal diverticulum is noted adjacent to the head of the pancreas. Vascular/Lymphatic: Atherosclerotic calcifications are identified. Additionally there are changes of lymphadenopathy along the right common iliac artery measuring approximately 12 mm in short axis. Periaortic and intra-aortocaval lymph nodes are seen as well as the previously described changes in the  region of the gastrohepatic ligament as well as the peripancreatic region. Some of these nodes are intimately associated with suggested pancreatic head mass. The largest of these measures approximately 2.1 cm in short axis with central necrosis. Reproductive: Prostate is unremarkable. Other: No abdominal wall hernia or abnormality. No abdominopelvic ascites. Musculoskeletal: No acute or significant osseous findings. IMPRESSION: Changes consistent with hepatic metastatic disease and likely primary pancreatic mass in the region of the head of the pancreas. Surrounding associated lymphadenopathy is noted as described. Tissue sampling of the liver lesions is recommended. Additional workup for distant metastatic disease is recommended as well. Nonobstructing right renal stone These  results were called by telephone at the time of interpretation on 10/29/2017 at 2:52 pm to [facility] , who verbally acknowledged these results. Electronically Signed   By: Inez Catalina M.D.   On: 10/29/2017 14:56   Ir US Guide Bx Asp/drain  Result Date: 10/30/2017 INDICATION: Concern for metastatic pancreatic cancer. Please perform liver lesion biopsy for tissue diagnostic purposes. EXAM: ULTRASOUND GUIDED LIVER LESION BIOPSY COMPARISON:  CT abdomen and pelvis - 11/20/202=18 MEDICATIONS: None ANESTHESIA/SEDATION: Fentanyl 75 mcg IV; Versed 2 mg IV Total Moderate Sedation time: 11 minutes. The patient's level of consciousness and vital signs were monitored continuously by radiology nursing throughout the procedure under my direct supervision. COMPLICATIONS: None immediate. PROCEDURE: Informed written consent was obtained from the patient after a discussion of the risks, benefits and alternatives to treatment. The patient understands and consents the procedure. A timeout was performed prior to the initiation of the procedure. Ultrasound scanning was performed of the right upper abdominal quadrant demonstrates a large, at least  6.4 x 5.1 cm, lesion/mass within the right lobe liver correlating with the lesion seen on image 25, series 2 of preceding abdominal CT. The procedure was planned. The right upper abdominal quadrant was prepped and draped in the usual sterile fashion. The overlying soft tissues were anesthetized with 1% lidocaine with epinephrine. A 17 gauge, 6.8 cm co-axial needle was advanced into a peripheral aspect of the lesion. This was followed by 4 core biopsies with an 18 gauge core device under direct ultrasound guidance. The coaxial needle tract was embolized with a small amount of Gel-Foam slurry and superficial hemostasis was obtained with manual compression. Post procedural scanning was negative for definitive area of hemorrhage or additional complication. A dressing was placed. The patient tolerated the procedure well without immediate post procedural complication. IMPRESSION: Technically successful ultrasound guided core needle biopsy of indeterminate mass within the caudal aspect of the right lobe of the liver. Electronically Signed   By: Sandi Mariscal M.D.   On: 10/30/2017 17:41    Scheduled Meds: . insulin aspart  0-5 Units Subcutaneous QHS  . insulin aspart  0-9 Units Subcutaneous TID WC  . lidocaine-EPINEPHrine      . multivitamin with minerals  1 tablet Oral Daily  . [START ON 11/02/2017] pantoprazole  40 mg Intravenous Q12H   Continuous Infusions: . sodium chloride 125 mL/hr at 10/30/17 1747  . pantoprozole (PROTONIX) infusion 8 mg/hr (10/30/17 1500)     LOS: 1 day   Time spent: 25 minutes.  Vance Gather, MD [facility] Pager [phone]  If 7PM-7AM, please contact night-coverage [email or web address] Password [facility] 10/30/2017, 5:53 PM

## 2017-10-30 NOTE — Consult Note (Signed)
[facility] Surgery Consult Note  Dimas Scheck 1961/06/10  [id number].    Requesting MD: Irine Seal Chief Complaint/Reason for Consult: GI bleed  HPI:  [name] is a 56yo male admitted to [facility] yesterday with a GI bleed. He reports 1-2 weeks of mild upper abdominal pain, early satiety, fatigue, and melena. Went to see his PCP yesterday who sent him to the ED due to anemia and black stools. In the ED he was found to have WBC 13.5, hemoglobin 7.8, platelets 356. Hemoccult positive. CT abdomen pelvis showed changes consistent with hepatic metastatic disease and likely primary pancreatic mass in the region of the head of the pancreas; surrounding associated lymphadenopathy was noted. [facility] was [facility] by the medicine team. Hemoglobin dropped to 7.0 and he was given 1 uPRBC this morning. Gastroenterology was consulted and performed EGD today which showed a large infiltrative and ulcerated mass with bleeding was found in the second portion of the duodenum; biopsies pending. He is going for a liver biopsy in IR later today. General surgery was consulted in case the patient continues to bleed and should require intervention. Currently patient is doing well. Denies any abdominal pain. States that he is hungry. Last BM was 11/19 and was dark. He is passing flatus. He does report about 20lb weight loss over the last 1-2 months. Denies fever, chills, CP, SOB, n/v, hematochezia, dysuria.   PMH significant for HTN, DM, HLD Abdominal surgical history: none Anticoagulants: none Nonsmoker Employment: [employer]  ROS: Review of Systems  Constitutional: Positive for malaise/fatigue and weight loss.  HENT: Negative.   Eyes: Negative.   Respiratory: Negative.   Cardiovascular: Negative.   Gastrointestinal: Positive for abdominal pain and melena. Negative for blood in stool, constipation, diarrhea, heartburn, nausea and vomiting.  Genitourinary: Negative.    Musculoskeletal: Negative.   Skin: Negative.   Neurological: Negative.     All systems reviewed and otherwise negative except for as above  Family History  Problem Relation Age of Onset  . Diabetes Father   . Lung cancer Father        smoker  . Stroke Mother   . Ovarian cancer Sister 79  . BRCA 1/2 Sister        BRCA2 pos  . Leukemia Maternal Aunt   . Stomach cancer Maternal Grandfather   . Prostate cancer Paternal Grandfather   . Melanoma Sister   . BRCA 1/2 Sister        BRCA2 negative  . Leukemia Cousin        maternal first cousin  . Heart attack Neg Hx   . Colon cancer Neg Hx     Past Medical History:  Diagnosis Date  . BRCA2 positive   . Diabetes mellitus 2008  . Family history of BRCA2 gene positive   . Family history of ovarian cancer   . Family history of prostate cancer   . Fatty liver 2008   Fatty liver, saw GI    . Hyperlipidemia   . Hypertension     Past Surgical History:  Procedure Laterality Date  . NO PAST SURGERIES      Social History:  reports that  has never smoked. he has never used smokeless tobacco. He reports that he does not drink alcohol or use drugs.  Allergies: No Known Allergies  Medications Prior to Admission  Medication Sig Dispense Refill  . aspirin 325 MG tablet Take 325 mg by mouth daily.      Marland Kitchen  atorvastatin (LIPITOR) 10 MG tablet Take 1 tablet (10 mg total) by mouth at bedtime. 90 tablet 1  . fish oil-omega-3 fatty acids 1000 MG capsule Take 4 g by mouth daily.     Marland Kitchen lisinopril-hydrochlorothiazide (PRINZIDE,ZESTORETIC) 10-12.5 MG tablet Take 1 tablet by mouth daily. 90 tablet 1  . metFORMIN (GLUCOPHAGE) 500 MG tablet Take 1 tablet (500 mg total) by mouth 2 (two) times daily with a meal. (Patient taking differently: Take 500-1,000 mg by mouth 2 (two) times daily with a meal. 2 TABLETS IN THE AM AND 1 TABLET IN THE EVENING) 180 tablet 1  . multivitamin-iron-minerals-folic acid (CENTRUM) chewable tablet Chew 1 tablet by mouth  daily.      . naproxen sodium (ALEVE) 220 MG tablet Take 220 mg by mouth daily as needed (PAIN).    Marland Kitchen blood glucose meter kit and supplies Dispense based on patient and insurance preference. Use up to four times daily as directed. (FOR ICD-9 250.00, 250.01). 1 each 0  . glucose blood test strip Use to test blood sugar up to 4 times daily as directed. Dx code: E11.9 400 each 3    Prior to Admission medications   Medication Sig Start Date End Date Taking? Authorizing Provider  aspirin 325 MG tablet Take 325 mg by mouth daily.     Yes [provider]  atorvastatin (LIPITOR) 10 MG tablet Take 1 tablet (10 mg total) by mouth at bedtime. 07/18/17  Yes [name], MD  fish oil-omega-3 fatty acids 1000 MG capsule Take 4 g by mouth daily.    Yes [provider]  lisinopril-hydrochlorothiazide (PRINZIDE,ZESTORETIC) 10-12.5 MG tablet Take 1 tablet by mouth daily. 07/18/17  Yes [name], MD  metFORMIN (GLUCOPHAGE) 500 MG tablet Take 1 tablet (500 mg total) by mouth 2 (two) times daily with a meal. Patient taking differently: Take 500-1,000 mg by mouth 2 (two) times daily with a meal. 2 TABLETS IN THE AM AND 1 TABLET IN THE EVENING 07/18/17  Yes [name], MD  multivitamin-iron-minerals-folic acid (CENTRUM) chewable tablet Chew 1 tablet by mouth daily.     Yes [provider]  naproxen sodium (ALEVE) 220 MG tablet Take 220 mg by mouth daily as needed (PAIN).   Yes [provider]  blood glucose meter kit and supplies Dispense based on patient and insurance preference. Use up to four times daily as directed. (FOR ICD-9 250.00, 250.01). 07/04/15   [name], MD  glucose blood test strip Use to test blood sugar up to 4 times daily as directed. Dx code: E11.9 07/07/15   [name], MD    Blood pressure 111/89, pulse 98, temperature 98.8 F (37.1 C), temperature source Oral, resp. rate 20, height 5' 6"  (1.676 m), weight 169 lb 9.6 oz (76.9 kg),  SpO2 98 %. Physical Exam: General: pleasant, WD/WN white male who is laying in bed in NAD HEENT: head is normocephalic, atraumatic.  Sclera are noninjected.  Pupils equal and round.  Ears and nose without any masses or lesions.  Mouth is pink and moist. Dentition fair Heart: regular, rate, and rhythm.  No obvious murmurs, gallops, or rubs noted.  Palpable pedal pulses bilaterally Lungs: CTAB, no wheezes, rhonchi, or rales noted.  Respiratory effort nonlabored Abd: soft, NT/ND, +BS, no masses, hernias, or organomegaly MS: all 4 extremities are symmetrical with no cyanosis, clubbing, or edema. Skin: warm and dry with no masses, lesions, or rashes Psych: A&Ox3 with an appropriate affect. Neuro: cranial nerves  grossly intact, extremity CSM intact bilaterally, normal speech  Results for orders placed or performed during the hospital encounter of 10/29/17 (from the past 48 hour(s))  Comprehensive metabolic panel     Status: Abnormal   Collection Time: 10/29/17 11:51 AM  Result Value Ref Range   Sodium 134 (L) 135 - 145 mmol/L   Potassium 6.0 (H) 3.5 - 5.1 mmol/L   Chloride 102 101 - 111 mmol/L   CO2 18 (L) 22 - 32 mmol/L   Glucose, Bld 119 (H) 65 - 99 mg/dL   BUN 36 (H) 6 - 20 mg/dL   Creatinine, Ser 1.18 0.61 - 1.24 mg/dL   Calcium 9.7 8.9 - 10.3 mg/dL   Total Protein 6.9 6.5 - 8.1 g/dL   Albumin 3.2 (L) 3.5 - 5.0 g/dL   AST 44 (H) 15 - 41 U/L   ALT 23 17 - 63 U/L   Alkaline Phosphatase 113 38 - 126 U/L   Total Bilirubin 0.8 0.3 - 1.2 mg/dL   GFR calc non Af Amer >60 >60 mL/min   GFR calc Af Amer >60 >60 mL/min    Comment: (NOTE) The eGFR has been calculated using the CKD EPI equation. This calculation has not been validated in all clinical situations. eGFR's persistently <60 mL/min signify possible Chronic Kidney Disease.    Anion gap 14 5 - 15  Occult blood card to lab, stool Provider will collect     Status: Abnormal   Collection Time: 10/29/17  1:27 PM  Result Value Ref Range    Fecal Occult Bld POSITIVE (A) NEGATIVE  Type and screen [facility]     Status: None (Preliminary result)   Collection Time: 10/29/17  1:37 PM  Result Value Ref Range   ABO/RH(D) O POS    Antibody Screen NEG    Sample Expiration 11/01/2017    Unit Number [id number]    Blood Component Type RED CELLS,LR    Unit division 00    Status of Unit ISSUED    Transfusion Status OK TO TRANSFUSE    Crossmatch Result Compatible   CBC     Status: Abnormal   Collection Time: 10/29/17  1:37 PM  Result Value Ref Range   WBC 13.5 (H) 4.0 - 10.5 K/uL   RBC 3.03 (L) 4.22 - 5.81 MIL/uL   Hemoglobin 7.8 (L) 13.0 - 17.0 g/dL   HCT 25.0 (L) 39.0 - 52.0 %   MCV 82.5 78.0 - 100.0 fL   MCH 25.7 (L) 26.0 - 34.0 pg   MCHC 31.2 30.0 - 36.0 g/dL   RDW 14.7 11.5 - 15.5 %   Platelets 356 150 - 400 K/uL  Lipase, blood     Status: None   Collection Time: 10/29/17  1:37 PM  Result Value Ref Range   Lipase 45 11 - 51 U/L  Basic metabolic panel     Status: Abnormal   Collection Time: 10/29/17  1:37 PM  Result Value Ref Range   Sodium 133 (L) 135 - 145 mmol/L   Potassium 5.1 3.5 - 5.1 mmol/L   Chloride 101 101 - 111 mmol/L   CO2 23 22 - 32 mmol/L   Glucose, Bld 111 (H) 65 - 99 mg/dL   BUN 36 (H) 6 - 20 mg/dL   Creatinine, Ser 1.17 0.61 - 1.24 mg/dL   Calcium 9.5 8.9 - 10.3 mg/dL   GFR calc non Af Amer >60 >60 mL/min   GFR calc Af Amer >60 >60 mL/min  Comment: (NOTE) The eGFR has been calculated using the CKD EPI equation. This calculation has not been validated in all clinical situations. eGFR's persistently <60 mL/min signify possible Chronic Kidney Disease.    Anion gap 9 5 - 15  Troponin I     Status: None   Collection Time: 10/29/17  1:37 PM  Result Value Ref Range   Troponin I <0.03 <0.03 ng/mL  ABO/Rh     Status: None   Collection Time: 10/29/17  1:37 PM  Result Value Ref Range   ABO/RH(D) O POS   Vitamin B12     Status: Abnormal   Collection Time: 10/29/17  6:18 PM   Result Value Ref Range   Vitamin B-12 1,499 (H) 180 - 914 pg/mL    Comment: (NOTE) This assay is not validated for testing neonatal or myeloproliferative syndrome specimens for Vitamin B12 levels. Performed at [facility], [place] [address] [place], [place] [address]   Folate     Status: None   Collection Time: 10/29/17  6:18 PM  Result Value Ref Range   Folate 17.0 >5.9 ng/mL    Comment: Performed at [place] [address] [place], [place] [address]  Iron and TIBC     Status: Abnormal   Collection Time: 10/29/17  6:18 PM  Result Value Ref Range   Iron 19 (L) 45 - 182 ug/dL   TIBC 382 250 - 450 ug/dL   Saturation Ratios 5 (L) 17.9 - 39.5 %   UIBC 363 ug/dL    Comment: Performed at [facility], [place] [address] [place], [place] [address]  Ferritin     Status: None   Collection Time: 10/29/17  6:18 PM  Result Value Ref Range   Ferritin 92 24 - 336 ng/mL    Comment: Performed at [place] [address] [place], [place] [address]  Reticulocytes     Status: Abnormal   Collection Time: 10/29/17  6:18 PM  Result Value Ref Range   Retic Ct Pct 2.4 0.4 - 3.1 %   RBC. 2.93 (L) 4.22 - 5.81 MIL/uL   Retic Count, Absolute 70.3 19.0 - 186.0 K/uL  Magnesium     Status: None   Collection Time: 10/29/17  6:18 PM  Result Value Ref Range   Magnesium 2.0 1.7 - 2.4 mg/dL  CBC with Differential/Platelet     Status: Abnormal   Collection Time: 10/29/17  6:18 PM  Result Value Ref Range   WBC 11.7 (H) 4.0 - 10.5 K/uL   RBC 2.91 (L) 4.22 - 5.81 MIL/uL   Hemoglobin 7.6 (L) 13.0 - 17.0 g/dL   HCT 24.4 (L) 39.0 - 52.0 %   MCV 83.8 78.0 - 100.0 fL   MCH 26.1 26.0 - 34.0 pg   MCHC 31.1 30.0 - 36.0 g/dL   RDW 14.6 11.5 - 15.5 %   Platelets 335 150 - 400 K/uL   Neutrophils Relative % 81 %   Neutro Abs 9.5 (H) 1.7 - 7.7 K/uL   Lymphocytes Relative 12 %   Lymphs Abs 1.4 0.7 - 4.0 K/uL   Monocytes Relative 6 %   Monocytes Absolute 0.7 0.1 - 1.0 K/uL    Eosinophils Relative 1 %   Eosinophils Absolute 0.1 0.0 - 0.7 K/uL   Basophils Relative 0 %   Basophils Absolute 0.0 0.0 - 0.1 K/uL  Hemoglobin A1c     Status: Abnormal   Collection Time: 10/29/17  6:18 PM  Result Value Ref Range   Hgb A1c MFr Bld 6.6 (H) 4.8 - 5.6 %    Comment: (NOTE) Pre diabetes:          5.7%-6.4% Diabetes:              >6.4% Glycemic control for   <7.0% adults with diabetes    Mean Plasma Glucose 142.72 mg/dL    Comment: Performed at [place] [address] [place],  [address]  Cancer antigen 19-9     Status: Abnormal   Collection Time: 10/29/17  6:18 PM  Result Value Ref Range   CA 19-9 6,905 (H) 0 - 35 U/mL    Comment: (NOTE) Results confirmed on dilution. Roche ECLIA methodology Performed At: [facility] [place], [place] [id number] Rush Farmer MD QH:[id number]   Glucose, capillary     Status: Abnormal   Collection Time: 10/29/17 10:01 PM  Result Value Ref Range   Glucose-Capillary 115 (H) 65 - 99 mg/dL   Comment 1 Notify RN   CBC with Differential/Platelet     Status: Abnormal   Collection Time: 10/30/17  1:06 AM  Result Value Ref Range   WBC 8.7 4.0 - 10.5 K/uL   RBC 2.65 (L) 4.22 - 5.81 MIL/uL   Hemoglobin 7.0 (L) 13.0 - 17.0 g/dL   HCT 22.0 (L) 39.0 - 52.0 %   MCV 83.0 78.0 - 100.0 fL   MCH 26.4 26.0 - 34.0 pg   MCHC 31.8 30.0 - 36.0 g/dL   RDW 14.8 11.5 - 15.5 %   Platelets 260 150 - 400 K/uL   Neutrophils Relative % 78 %   Neutro Abs 6.8 1.7 - 7.7 K/uL   Lymphocytes Relative 15 %   Lymphs Abs 1.3 0.7 - 4.0 K/uL   Monocytes Relative 6 %   Monocytes Absolute 0.5 0.1 - 1.0 K/uL   Eosinophils Relative 1 %   Eosinophils Absolute 0.1 0.0 - 0.7 K/uL   Basophils Relative 0 %   Basophils Absolute 0.0 0.0 - 0.1 K/uL  Comprehensive metabolic panel     Status: Abnormal   Collection Time: 10/30/17  1:06 AM  Result Value Ref Range   Sodium 135 135 - 145 mmol/L   Potassium 5.0 3.5 - 5.1 mmol/L    Chloride 107 101 - 111 mmol/L   CO2 21 (L) 22 - 32 mmol/L   Glucose, Bld 104 (H) 65 - 99 mg/dL   BUN 22 (H) 6 - 20 mg/dL   Creatinine, Ser 0.94 0.61 - 1.24 mg/dL   Calcium 8.7 (L) 8.9 - 10.3 mg/dL   Total Protein 6.2 (L) 6.5 - 8.1 g/dL   Albumin 2.8 (L) 3.5 - 5.0 g/dL   AST 24 15 - 41 U/L   ALT 20 17 - 63 U/L   Alkaline Phosphatase 96 38 - 126 U/L   Total Bilirubin 0.4 0.3 - 1.2 mg/dL   GFR calc non Af Amer >60 >60 mL/min   GFR calc Af Amer >60 >60 mL/min    Comment: (NOTE) The eGFR has been calculated using the CKD EPI equation. This calculation has not been validated in all clinical situations. eGFR's persistently <60 mL/min signify possible Chronic Kidney Disease.    Anion gap 7 5 - 15  Protime-INR     Status: None   Collection Time: 10/30/17  1:06 AM  Result Value Ref Range   Prothrombin Time 14.0 11.4 - 15.2 seconds   INR 1.09   Prepare  RBC     Status: None   Collection Time: 10/30/17  3:00 AM  Result Value Ref Range   Order Confirmation ORDER PROCESSED BY BLOOD BANK   Urinalysis, Routine w reflex microscopic     Status: Abnormal   Collection Time: 10/30/17  4:34 AM  Result Value Ref Range   Color, Urine STRAW (A) YELLOW   APPearance CLEAR CLEAR   Specific Gravity, Urine 1.012 1.005 - 1.030   pH 5.0 5.0 - 8.0   Glucose, UA NEGATIVE NEGATIVE mg/dL   Hgb urine dipstick NEGATIVE NEGATIVE   Bilirubin Urine NEGATIVE NEGATIVE   Ketones, ur 5 (A) NEGATIVE mg/dL   Protein, ur NEGATIVE NEGATIVE mg/dL   Nitrite NEGATIVE NEGATIVE   Leukocytes, UA NEGATIVE NEGATIVE  Glucose, capillary     Status: Abnormal   Collection Time: 10/30/17  7:50 AM  Result Value Ref Range   Glucose-Capillary 109 (H) 65 - 99 mg/dL  CBC with Differential/Platelet     Status: Abnormal   Collection Time: 10/30/17  9:21 AM  Result Value Ref Range   WBC 8.6 4.0 - 10.5 K/uL   RBC 2.92 (L) 4.22 - 5.81 MIL/uL   Hemoglobin 7.7 (L) 13.0 - 17.0 g/dL   HCT 24.0 (L) 39.0 - 52.0 %   MCV 82.2 78.0 - 100.0 fL    MCH 26.4 26.0 - 34.0 pg   MCHC 32.1 30.0 - 36.0 g/dL   RDW 14.5 11.5 - 15.5 %   Platelets 270 150 - 400 K/uL   Neutrophils Relative % 80 %   Neutro Abs 7.0 1.7 - 7.7 K/uL   Lymphocytes Relative 13 %   Lymphs Abs 1.1 0.7 - 4.0 K/uL   Monocytes Relative 6 %   Monocytes Absolute 0.5 0.1 - 1.0 K/uL   Eosinophils Relative 1 %   Eosinophils Absolute 0.1 0.0 - 0.7 K/uL   Basophils Relative 0 %   Basophils Absolute 0.0 0.0 - 0.1 K/uL  Comprehensive metabolic panel     Status: Abnormal   Collection Time: 10/30/17  9:21 AM  Result Value Ref Range   Sodium 138 135 - 145 mmol/L   Potassium 4.5 3.5 - 5.1 mmol/L   Chloride 109 101 - 111 mmol/L   CO2 21 (L) 22 - 32 mmol/L   Glucose, Bld 107 (H) 65 - 99 mg/dL   BUN 17 6 - 20 mg/dL   Creatinine, Ser 0.77 0.61 - 1.24 mg/dL   Calcium 8.8 (L) 8.9 - 10.3 mg/dL   Total Protein 6.2 (L) 6.5 - 8.1 g/dL   Albumin 2.8 (L) 3.5 - 5.0 g/dL   AST 21 15 - 41 U/L   ALT 18 17 - 63 U/L   Alkaline Phosphatase 93 38 - 126 U/L   Total Bilirubin 1.0 0.3 - 1.2 mg/dL   GFR calc non Af Amer >60 >60 mL/min   GFR calc Af Amer >60 >60 mL/min    Comment: (NOTE) The eGFR has been calculated using the CKD EPI equation. This calculation has not been validated in all clinical situations. eGFR's persistently <60 mL/min signify possible Chronic Kidney Disease.    Anion gap 8 5 - 15  Glucose, capillary     Status: None   Collection Time: 10/30/17 12:18 PM  Result Value Ref Range   Glucose-Capillary 91 65 - 99 mg/dL   Dg Chest 2 View  Result Date: 10/29/2017 CLINICAL DATA:  Hypotension. EXAM: CHEST  2 VIEW COMPARISON:  None. FINDINGS: The heart size and mediastinal contours  are within normal limits. Both lungs are clear. The visualized skeletal structures are unremarkable. IMPRESSION: No active cardiopulmonary disease. Electronically Signed   By: Ashley Royalty M.D.   On: 10/29/2017 17:56   Ct Abdomen Pelvis W Contrast  Result Date: 10/29/2017 CLINICAL DATA:  Fatigue  for 1 week and epigastric pain with weight loss EXAM: CT ABDOMEN AND PELVIS WITH CONTRAST TECHNIQUE: Multidetector CT imaging of the abdomen and pelvis was performed using the standard protocol following bolus administration of intravenous contrast. CONTRAST:  131m ISOVUE-300 IOPAMIDOL (ISOVUE-300) INJECTION 61% COMPARISON:  None. FINDINGS: Lower chest: Lung bases are free of acute infiltrate or sizable effusion. Hepatobiliary: The gallbladder is within normal limits. The liver demonstrates multiple hypodense lesions with peripheral enhancement most consistent with metastatic disease. These do not demonstrate significant enhancement on delayed images. The largest of these in the left lobe of the liver measures approximately 5.1 cm. The largest of these in the right lobe of the liver measures approximately 5.8 cm. No biliary ductal dilatation is seen. Pancreas: The body and tail of the pancreas are within normal limits. In the region of the pancreatic head however in the head as prominent with suggestion of underlying mass lesion. This would correspond with the changes seen in the liver. There is suggestion of mass lesion measures approximately 5 cm in greatest dimension. Some associated adjacent necrotic appearing lymph nodes are seen in the region of the celiac axis (15 mm) as well as in the periaortic region and mesentery (12 mm). Peripancreatic (2.1 cm) and gastrohepatic (11 mm) adenopathy is noted as well. An adjacent duodenal diverticulum is seen with both air and fluid within. Spleen: Normal in size without focal abnormality. Adrenals/Urinary Tract: The adrenal glands are within normal limits. The kidneys demonstrates no obstructive change. Large nonobstructing right renal stone is seen measuring approximately 10 mm. The bladder is partially distended. Stomach/Bowel: Bilateral inguinal hernias are identified with loops of small bowel within the right inguinal hernia and loops of sigmoid colon in the left  inguinal hernia. No obstructive changes are seen. The appendix is within normal limits. Scattered diverticular change is noted in the colon without diverticulitis. Large duodenal diverticulum is noted adjacent to the head of the pancreas. Vascular/Lymphatic: Atherosclerotic calcifications are identified. Additionally there are changes of lymphadenopathy along the right common iliac artery measuring approximately 12 mm in short axis. Periaortic and intra-aortocaval lymph nodes are seen as well as the previously described changes in the region of the gastrohepatic ligament as well as the peripancreatic region. Some of these nodes are intimately associated with suggested pancreatic head mass. The largest of these measures approximately 2.1 cm in short axis with central necrosis. Reproductive: Prostate is unremarkable. Other: No abdominal wall hernia or abnormality. No abdominopelvic ascites. Musculoskeletal: No acute or significant osseous findings. IMPRESSION: Changes consistent with hepatic metastatic disease and likely primary pancreatic mass in the region of the head of the pancreas. Surrounding associated lymphadenopathy is noted as described. Tissue sampling of the liver lesions is recommended. Additional workup for distant metastatic disease is recommended as well. Nonobstructing right renal stone These results were called by telephone at the time of interpretation on 10/29/2017 at 2:52 pm to [facility] , who verbally acknowledged these results. Electronically Signed   By: MInez CatalinaM.D.   On: 10/29/2017 14:56     Assessment/Plan HTN DM HLD Pancreatic mass with metastatic lesions of the liver Melena Acute blood loss anemia GI bleed - 1-2 weeks of melena and fatigue -  hemoglobin dropped to 7.0 this morning, s/p 1 uPRBC - EGD 11/21 showed a large infiltrative and ulcerated mass with bleeding was found in the second portion of the duodenum. Biopsies pending - going for biopsy in IR today  of liver lesion - Ca19-9 is 6905  ID - none VTE - SCDs, no chemical DVT prophylaxis due to anemia FEN - IVF, NPO for procedure Foley - none Follow up - TBD  Plan - Patient with GI bleed secondary to an ulcerated mass in the duodenum. He received 1 uPRBC this AM and is currently stable. Plan to repeat CBC in the AM. Transfuse PRN per primary. Hopefully bleeding will slow without intervention. If he does continue to bleed will need to consider embolization in IR versus resection in the OR.  Wellington Hampshire, [facility] Surgery 10/30/2017, 2:34 PM Pager: [phone] Consults: [phone] Mon-Fri 7:00 am-4:30 pm Sat-Sun 7:00 am-11:30 am

## 2017-10-30 NOTE — Procedures (Signed)
Pre Procedure Dx: Liver masses, worrisome for metastatic pancreatic cancer Post Procedural Dx: Same  Technically successful US guided biopsy of indeterminate mass within the right lobe of the liver.  EBL: None  No immediate complications.   Ronny Bacon, MD Pager #: [phone]

## 2017-10-30 NOTE — Transfer of Care (Signed)
Immediate Anesthesia Transfer of Care Note  Patient: [name]  Procedure(s) Performed: ESOPHAGOGASTRODUODENOSCOPY (EGD) WITH PROPOFOL (N/A )  Patient Location: Endoscopy Unit  Anesthesia Type:MAC  Level of Consciousness: awake, alert  and oriented  Airway & Oxygen Therapy: Patient Spontanous Breathing and Patient connected to nasal cannula oxygen  Post-op Assessment: Report given to RN and Post -op Vital signs reviewed and stable  Post vital signs: Reviewed and stable  Last Vitals:  Vitals:   10/30/17 0720 10/30/17 1025  BP: 103/62 115/66  Pulse: 96 100  Resp: 20 14  Temp: 37.6 C 37.1 C  SpO2: 95% 96%    Last Pain:  Vitals:   10/30/17 1025  TempSrc: Oral  PainSc:       Patients Stated Pain Goal: 1 (76/73/41 9379)  Complications: No apparent anesthesia complications

## 2017-10-30 NOTE — Consult Note (Signed)
Consultation  Referring Provider:   Dr. Bonner Puna   Primary Care Physician:  [name], MD Primary Gastroenterologist:    Dr. Fuller Plan     Reason for Consultation:    Melena, ABLA          HPI:   [name] is a 56 y.o. male with a past medical history as listed below who presented to the ER on 10/29/17 from his primary care physician's office after finding of a low hemoglobin at 8.1 (previously 15.8 on 09/20/14) and complaint of upper abdominal pain as well as black stools.    Today, the patient explains that for the past week and half he has been having at least 2-3 "black tarry stools" per day.  The patient explains that he was also experiencing some dizziness and shortness of breath as well as fatigue.  He found himself "having to sit down at work", and sometimes "even going home due to exhaustion and abdominal pain".  Patient then presented to his primary care provider yesterday who drew his labs and told him to come to the ER.  Patient does admit to using Aleve at least once a day over the past week and a half since start of his abdominal pain.  Prior to this patient did use Pepto-Bismol off and on for intermittent heartburn and reflux symptoms.  Associated symptoms include early satiety.  Patient has also lost a total of 20 pounds over the past 2 months.  Patient is on aspirin 325 mg daily, prescribed by PCP.  Patient tells me this was "for my heart".    Patient denies fever, chills, dysphagia or symptoms that awaken him at night.  ED Course: In the ED CBC obtained had a white count of 13.5, hemoglobin of 7.8, platelets of 356 otherwise was within normal limits.  Basic metabolic profile obtained had a sodium of 133 glucose of 111 BUN of 36 creatinine of 1.17 and twice was within normal limits.  Lipase level was 45.  First set of troponin was less than 0.03.  Chest x-ray was not done.  Urinalysis was not done.  EKG was not done.  CT abdomen pelvis showing changes consistent with  hepatic metastatic disease and likely primary pancreatic mass in the region of the head of the pancreas.  Surrounding associated lymphadenopathy was noted.  Tissue sampling of the liver was recommended.  Prior GI history: 07/30/13-Dr. Fuller Plan, Colonoscopy: Impression: Sessile polyp measuring 7 mm at the cecum, small internal hemorrhoids.  Pathology showing tubular adenoma.  Repeat recommended in 5 years.  Past Medical History:  Diagnosis Date  . BRCA2 positive   . Diabetes mellitus 2008  . Family history of BRCA2 gene positive   . Family history of ovarian cancer   . Family history of prostate cancer   . Fatty liver 2008   Fatty liver, saw GI    . Hyperlipidemia   . Hypertension     Past Surgical History:  Procedure Laterality Date  . NO PAST SURGERIES      Family History  Problem Relation Age of Onset  . Diabetes Father   . Lung cancer Father        smoker  . Stroke Mother   . Ovarian cancer Sister 5  . BRCA 1/2 Sister        BRCA2 pos  . Leukemia Maternal Aunt   . Stomach cancer Maternal Grandfather   . Prostate cancer Paternal Grandfather   . Melanoma Sister   .  BRCA 1/2 Sister        BRCA2 negative  . Leukemia Cousin        maternal first cousin  . Heart attack Neg Hx   . Colon cancer Neg Hx      Social History   Tobacco Use  . Smoking status: Never Smoker  . Smokeless tobacco: Never Used  Substance Use Topics  . Alcohol use: No  . Drug use: No    Prior to Admission medications   Medication Sig Start Date End Date Taking? Authorizing Provider  aspirin 325 MG tablet Take 325 mg by mouth daily.     Yes [provider]  atorvastatin (LIPITOR) 10 MG tablet Take 1 tablet (10 mg total) by mouth at bedtime. 07/18/17  Yes [name], MD  fish oil-omega-3 fatty acids 1000 MG capsule Take 4 g by mouth daily.    Yes [provider]  lisinopril-hydrochlorothiazide (PRINZIDE,ZESTORETIC) 10-12.5 MG tablet Take 1 tablet by mouth daily. 07/18/17  Yes  [name], MD  metFORMIN (GLUCOPHAGE) 500 MG tablet Take 1 tablet (500 mg total) by mouth 2 (two) times daily with a meal. Patient taking differently: Take 500-1,000 mg by mouth 2 (two) times daily with a meal. 2 TABLETS IN THE AM AND 1 TABLET IN THE EVENING 07/18/17  Yes [name], MD  multivitamin-iron-minerals-folic acid (CENTRUM) chewable tablet Chew 1 tablet by mouth daily.     Yes [provider]  naproxen sodium (ALEVE) 220 MG tablet Take 220 mg by mouth daily as needed (PAIN).   Yes [provider]  blood glucose meter kit and supplies Dispense based on patient and insurance preference. Use up to four times daily as directed. (FOR ICD-9 250.00, 250.01). 07/04/15   [name], MD  glucose blood test strip Use to test blood sugar up to 4 times daily as directed. Dx code: E11.9 07/07/15   [name], MD    Current Facility-Administered Medications  Medication Dose Route Frequency Provider Last Rate Last Dose  . 0.9 %  sodium chloride infusion   Intravenous Continuous Eugenie Filler, MD      . acetaminophen (TYLENOL) tablet 650 mg  650 mg Oral Q6H PRN Eugenie Filler, MD       Or  . acetaminophen (TYLENOL) suppository 650 mg  650 mg Rectal Q6H PRN Eugenie Filler, MD      . Influenza vac split quadrivalent PF (FLUARIX) injection 0.5 mL  0.5 mL Intramuscular Tomorrow-1000 Nat Christen, MD      . insulin aspart (novoLOG) injection 0-5 Units  0-5 Units Subcutaneous QHS Eugenie Filler, MD      . insulin aspart (novoLOG) injection 0-9 Units  0-9 Units Subcutaneous TID WC Eugenie Filler, MD      . multivitamin with minerals tablet 1 tablet  1 tablet Oral Daily Eugenie Filler, MD      . ondansetron [facility]) tablet 4 mg  4 mg Oral Q6H PRN Eugenie Filler, MD       Or  . ondansetron [facility]) injection 4 mg  4 mg Intravenous Q6H PRN Eugenie Filler, MD      . oxymetazoline (AFRIN) 0.05 % nasal spray 1 spray  1 spray Each Nare BID  PRN Schorr, Rhetta Mura, NP   1 spray at 10/30/17 0200  . pantoprazole (PROTONIX) 80 mg in sodium chloride 0.9 % 250 mL (0.32 mg/mL) infusion  8 mg/hr Intravenous Continuous Eugenie Filler, MD      . [  START ON 11/02/2017] pantoprazole (PROTONIX) injection 40 mg  40 mg Intravenous Q12H Eugenie Filler, MD      . sorbitol 70 % solution 30 mL  30 mL Oral Daily PRN Eugenie Filler, MD      . traMADol Veatrice Bourbon) tablet 50 mg  50 mg Oral Q6H PRN Eugenie Filler, MD        Allergies as of 10/29/2017  . (No Known Allergies)     Review of Systems:    Constitutional: No fever or chills Skin: No rash  Cardiovascular: No chest pain  Respiratory: Positive for DOE Gastrointestinal: See HPI and otherwise negative Genitourinary: No dysuria Neurological: Positive for dizziness when standing Musculoskeletal: No new muscle or joint pain Hematologic: No bruising Psychiatric: No history of depression or anxiety   Physical Exam:  Vital signs in last 24 hours: Temp:  [98.2 F (36.8 C)-99.7 F (37.6 C)] 99.7 F (37.6 C) (11/21 0720) Pulse Rate:  [90-118] 96 (11/21 0720) Resp:  [16-20] 20 (11/21 0720) BP: (89-131)/(55-70) 103/62 (11/21 0720) SpO2:  [94 %-100 %] 95 % (11/21 0720) Weight:  [169 lb 9.6 oz (76.9 kg)] 169 lb 9.6 oz (76.9 kg) (11/20 1820) Last BM Date: 10/28/17 General:   Pleasant Pale Caucasian male appears to be in NAD, Well developed, Well nourished, alert and cooperative Head:  Normocephalic and atraumatic. Eyes:   PEERL, EOMI. No icterus. Conjunctiva pink. Ears:  Normal auditory acuity. Neck:  Supple Throat: Oral cavity and pharynx without inflammation, swelling or lesion. Teeth in good condition. Lungs: Respirations even and unlabored. Lungs clear to auscultation bilaterally.   No wheezes, crackles, or rhonchi.  Heart: Normal S1, S2. No MRG. Regular rate and rhythm. No peripheral edema, cyanosis or pallor.  Abdomen:  Soft, nondistended, nontender. No rebound or  guarding. Normal bowel sounds. No appreciable masses or hepatomegaly. Rectal:  Not performed.  Msk:  Symmetrical without gross deformities. Peripheral pulses intact.  Extremities:  Without edema, no deformity or joint abnormality. Normal ROM, normal sensation. Neurologic:  Alert and  oriented x4;  grossly normal neurologically.  Skin:   Dry and intact without significant lesions or rashes. Psychiatric:  Demonstrates good judgement and reason without abnormal affect or behaviors.   LAB RESULTS: Recent Labs    10/29/17 1337 10/29/17 1818 10/30/17 0106  WBC 13.5* 11.7* 8.7  HGB 7.8* 7.6* 7.0*  HCT 25.0* 24.4* 22.0*  PLT 356 335 260   BMET Recent Labs    10/29/17 1151 10/29/17 1337 10/30/17 0106  NA 134* 133* 135  K 6.0* 5.1 5.0  CL 102 101 107  CO2 18* 23 21*  GLUCOSE 119* 111* 104*  BUN 36* 36* 22*  CREATININE 1.18 1.17 0.94  CALCIUM 9.7 9.5 8.7*   LFT Recent Labs    10/30/17 0106  PROT 6.2*  ALBUMIN 2.8*  AST 24  ALT 20  ALKPHOS 96  BILITOT 0.4   PT/INR Recent Labs    10/30/17 0106  LABPROT 14.0  INR 1.09    STUDIES: Dg Chest 2 View  Result Date: 10/29/2017 CLINICAL DATA:  Hypotension. EXAM: CHEST  2 VIEW COMPARISON:  None. FINDINGS: The heart size and mediastinal contours are within normal limits. Both lungs are clear. The visualized skeletal structures are unremarkable. IMPRESSION: No active cardiopulmonary disease. Electronically Signed   By: Ashley Royalty M.D.   On: 10/29/2017 17:56   Ct Abdomen Pelvis W Contrast  Result Date: 10/29/2017 CLINICAL DATA:  Fatigue for 1 week and epigastric pain with weight  loss EXAM: CT ABDOMEN AND PELVIS WITH CONTRAST TECHNIQUE: Multidetector CT imaging of the abdomen and pelvis was performed using the standard protocol following bolus administration of intravenous contrast. CONTRAST:  157m ISOVUE-300 IOPAMIDOL (ISOVUE-300) INJECTION 61% COMPARISON:  None. FINDINGS: Lower chest: Lung bases are free of acute infiltrate or  sizable effusion. Hepatobiliary: The gallbladder is within normal limits. The liver demonstrates multiple hypodense lesions with peripheral enhancement most consistent with metastatic disease. These do not demonstrate significant enhancement on delayed images. The largest of these in the left lobe of the liver measures approximately 5.1 cm. The largest of these in the right lobe of the liver measures approximately 5.8 cm. No biliary ductal dilatation is seen. Pancreas: The body and tail of the pancreas are within normal limits. In the region of the pancreatic head however in the head as prominent with suggestion of underlying mass lesion. This would correspond with the changes seen in the liver. There is suggestion of mass lesion measures approximately 5 cm in greatest dimension. Some associated adjacent necrotic appearing lymph nodes are seen in the region of the celiac axis (15 mm) as well as in the periaortic region and mesentery (12 mm). Peripancreatic (2.1 cm) and gastrohepatic (11 mm) adenopathy is noted as well. An adjacent duodenal diverticulum is seen with both air and fluid within. Spleen: Normal in size without focal abnormality. Adrenals/Urinary Tract: The adrenal glands are within normal limits. The kidneys demonstrates no obstructive change. Large nonobstructing right renal stone is seen measuring approximately 10 mm. The bladder is partially distended. Stomach/Bowel: Bilateral inguinal hernias are identified with loops of small bowel within the right inguinal hernia and loops of sigmoid colon in the left inguinal hernia. No obstructive changes are seen. The appendix is within normal limits. Scattered diverticular change is noted in the colon without diverticulitis. Large duodenal diverticulum is noted adjacent to the head of the pancreas. Vascular/Lymphatic: Atherosclerotic calcifications are identified. Additionally there are changes of lymphadenopathy along the right common iliac artery measuring  approximately 12 mm in short axis. Periaortic and intra-aortocaval lymph nodes are seen as well as the previously described changes in the region of the gastrohepatic ligament as well as the peripancreatic region. Some of these nodes are intimately associated with suggested pancreatic head mass. The largest of these measures approximately 2.1 cm in short axis with central necrosis. Reproductive: Prostate is unremarkable. Other: No abdominal wall hernia or abnormality. No abdominopelvic ascites. Musculoskeletal: No acute or significant osseous findings. IMPRESSION: Changes consistent with hepatic metastatic disease and likely primary pancreatic mass in the region of the head of the pancreas. Surrounding associated lymphadenopathy is noted as described. Tissue sampling of the liver lesions is recommended. Additional workup for distant metastatic disease is recommended as well. Nonobstructing right renal stone These results were called by telephone at the time of interpretation on 10/29/2017 at 2:52 pm to [facility] , who verbally acknowledged these results. Electronically Signed   By: MInez CatalinaM.D.   On: 10/29/2017 14:56   PREVIOUS ENDOSCOPIES:            See HPI   Impression / Plan:   Impression: 1.  Melena: For the past week and a half, 2-3 stools per day, hemoglobin dropped to 7 early this morning, patient is now status post 1 unit PRBCs; consider relation to pancreatic mass and effects on duodenum versus ulcer versus other 2.  Acute blood loss anemia: As above 3.  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  EGD today with Dr. Silverio Decamp at [phone].  Did review risks, benefits, limitations and alternatives and patient agrees to proceed. 2.  Recommend the patient discontinue use of NSAIDs. 3.  Patient will need further workup of new finding of pancreatic mass, possibly with EUS/FNA in the future.  This can be discussed after  EGD today. 4.  Continue to monitor hemoglobin with transfusion less than 7 5.  Continue Protonix 6.  Please await further recommendations from Dr. Silverio Decamp later today.  Thank you for your kind consultation, we will continue to follow.  Lavone Nian Lemmon  10/30/2017, 8:59 AM Pager #: [phone]   Attending physician's note   I have taken a history, examined the patient and reviewed the chart. I agree with the Advanced Practitioner's note, impression and recommendations. 35 yr M admitted with melena for the past few days and anemia hemoglobin 7.  He has been having intermittent mid abdominal pain associated with postprandial fullness for the past 3-4 weeks.  He has lost over 20 pounds in the past month. CT abdomen pelvis is concerning for pancreatic mass with metastatic disease to liver.  Will request IR to biopsy liver mets to obtain diagnosis No evidence of obstructive jaundice We will proceed with EGD to evaluate melena and anemia The risks and benefits as well as alternatives of endoscopic procedure(s) have been discussed and reviewed. All questions answered. The patient agrees to proceed.   Damaris Hippo, MD [phone] Mon-Fri 8a-5p [phone] after 5p, weekends, holidays

## 2017-10-30 NOTE — Op Note (Addendum)
[facility] Patient Name: [name] Procedure Date: 10/30/2017 MRN: [id number] Attending MD: Mauri Pole , MD Date of Birth: [birth year] CSN: [id number] Age: 56 Admit Type: Inpatient Procedure:                Upper GI endoscopy Indications:              Active gastrointestinal bleeding, Abnormal CT of                            the GI tract Providers:                Mauri Pole, MD, Elmer Ramp. Tilden Dome, RN, Elspeth Cho Tech., Technician, Danley Danker, CRNA Referring MD:              Medicines:                Monitored Anesthesia Care Complications:            No immediate complications. Estimated Blood Loss:     Estimated blood loss was minimal. Procedure:                Pre-Anesthesia Assessment:                           - Prior to the procedure, a History and Physical                            was performed, and patient medications and                            allergies were reviewed. The patient's tolerance of                            previous anesthesia was also reviewed. The risks                            and benefits of the procedure and the sedation                            options and risks were discussed with the patient.                            All questions were answered, and informed consent                            was obtained. Prior Anticoagulants: The patient has                            taken no previous anticoagulant or antiplatelet                            agents. ASA Grade Assessment: III - A patient with  severe systemic disease. After reviewing the risks                            and benefits, the patient was deemed in                            satisfactory condition to undergo the procedure.                           After obtaining informed consent, the endoscope was                            passed under direct vision. Throughout the            procedure, the patient's blood pressure, pulse, and                            oxygen saturations were monitored continuously. The                            [id number]) scope was introduced through the                            mouth, and advanced to the second part of duodenum.                            The upper GI endoscopy was accomplished without                            difficulty. The patient tolerated the procedure                            well. Scope In: Scope Out: Findings:      LA Grade C (one or more mucosal breaks continuous between tops of 2 or       more mucosal folds, less than 75% circumference) esophagitis with no       bleeding was found 34 to 38 cm from the incisors.      The stomach was normal.      A large infiltrative and ulcerated mass with bleeding, large adherent       clot, involving >50 % luminal surface area 5-7cm in length was found in       the second portion of the duodenum. Biopsies were taken with a cold       forceps for histology. Not amenable to endoscopic intervention to       control the bleeding. Impression:               - LA Grade C erosive esophagitis.                           - Normal stomach.                           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                            Biopsied. Moderate Sedation:      N/A- Per Anesthesia Care Recommendation:           - NPO.                           - Continue present medications.                           - Await pathology results.                           - IR consulted for biopsy of liver lesion                           - Consult surgery for possible palliative resection                            if biopsies confirm metastatic disease Procedure Code(s):        --- Professional ---                           [phone], Esophagogastroduodenoscopy, flexible,                             transoral; with biopsy, single or multiple Diagnosis Code(s):        --- Professional ---                           K20.8, Other esophagitis                           K31.89, Other diseases of stomach and duodenum                           K92.2, Gastrointestinal hemorrhage, unspecified                           R93.3, Abnormal findings on diagnostic imaging of                            other parts of digestive tract CPT copyright 2016 [employer]. All rights reserved. The codes documented in this report are preliminary and upon coder review may  be revised to meet current compliance requirements. Mauri Pole, MD 10/30/2017 11:50:43 AM This report has been signed electronically. Number of Addenda: 0

## 2017-10-30 NOTE — Anesthesia Preprocedure Evaluation (Signed)
Anesthesia Evaluation  Patient identified by MRN, date of birth, ID band Patient awake    Reviewed: Allergy & Precautions, NPO status , Patient's Chart, lab work & pertinent test results  Airway Mallampati: II  TM Distance: >3 FB Neck ROM: Full    Dental no notable dental hx.    Pulmonary neg pulmonary ROS,    Pulmonary exam normal breath sounds clear to auscultation       Cardiovascular hypertension, Normal cardiovascular exam Rhythm:Regular Rate:Normal     Neuro/Psych negative neurological ROS  negative psych ROS   GI/Hepatic negative GI ROS, Neg liver ROS,   Endo/Other  diabetesPancreatic mass with metastatic lesions of the liver  Renal/GU negative Renal ROS  negative genitourinary   Musculoskeletal negative musculoskeletal ROS (+)   Abdominal   Peds negative pediatric ROS (+)  Hematology  (+) anemia ,   Anesthesia Other Findings   Reproductive/Obstetrics negative OB ROS                             Anesthesia Physical Anesthesia Plan  ASA: IV  Anesthesia Plan: MAC   Post-op Pain Management:    Induction: Intravenous  PONV Risk Score and Plan: 0  Airway Management Planned: Nasal Cannula  Additional Equipment:   Intra-op Plan:   Post-operative Plan:   Informed Consent: I have reviewed the patients History and Physical, chart, labs and discussed the procedure including the risks, benefits and alternatives for the proposed anesthesia with the patient or authorized representative who has indicated his/her understanding and acceptance.   Dental advisory given  Plan Discussed with: CRNA and Surgeon  Anesthesia Plan Comments:         Anesthesia Quick Evaluation

## 2017-10-30 NOTE — Anesthesia Procedure Notes (Signed)
Date/Time: 10/30/2017 11:16 AM Performed by: Sharlette Dense, CRNA Oxygen Delivery Method: Nasal cannula

## 2017-10-30 NOTE — Progress Notes (Signed)
PT Cancellation Note  Patient Details Name: [name] MRN: [id number] DOB: [birth year]   Cancelled Treatment:    Reason Eval/Treat Not Completed: Patient at procedure or test/unavailable(pt having EGD. Will follow. )   Philomena Doheny 10/30/2017, 10:34 AM [phone]

## 2017-10-30 NOTE — Progress Notes (Signed)
OT Cancellation Note  Patient Details Name: Saeed Toren MRN: [id number] DOB: May 25, 1961   Cancelled Treatment:    Reason Eval/Treat Not Completed: Patient at procedure or test/ unavailable. Will follow.  Malka So 10/30/2017, 10:30 AM  10/30/2017 Nestor Lewandowsky, OTR/L Pager: [phone]

## 2017-10-31 LAB — CBC WITH DIFFERENTIAL/PLATELET
BASOS PCT: 0 %
BASOS PCT: 0 %
Basophils Absolute: 0 10*3/uL (ref 0.0–0.1)
Basophils Absolute: 0 10*3/uL (ref 0.0–0.1)
EOS ABS: 0.1 10*3/uL (ref 0.0–0.7)
EOS ABS: 0.2 10*3/uL (ref 0.0–0.7)
EOS PCT: 2 %
EOS PCT: 2 %
HCT: 23.9 % — ABNORMAL LOW (ref 39.0–52.0)
HCT: 27.5 % — ABNORMAL LOW (ref 39.0–52.0)
HEMOGLOBIN: 8.5 g/dL — AB (ref 13.0–17.0)
Hemoglobin: 7.6 g/dL — ABNORMAL LOW (ref 13.0–17.0)
Lymphocytes Relative: 14 %
Lymphocytes Relative: 12 %
Lymphs Abs: 1 10*3/uL (ref 0.7–4.0)
Lymphs Abs: 1.2 10*3/uL (ref 0.7–4.0)
MCH: 26.4 pg (ref 26.0–34.0)
MCH: 26 pg (ref 26.0–34.0)
MCHC: 31.8 g/dL (ref 30.0–36.0)
MCHC: 30.9 g/dL (ref 30.0–36.0)
MCV: 83 fL (ref 78.0–100.0)
MCV: 84.1 fL (ref 78.0–100.0)
MONO ABS: 0.5 10*3/uL (ref 0.1–1.0)
MONOS PCT: 7 %
Monocytes Absolute: 0.5 10*3/uL (ref 0.1–1.0)
Monocytes Relative: 5 %
NEUTROS PCT: 77 %
NEUTROS PCT: 81 %
Neutro Abs: 5.6 10*3/uL (ref 1.7–7.7)
Neutro Abs: 8.3 10*3/uL — ABNORMAL HIGH (ref 1.7–7.7)
PLATELETS: 294 10*3/uL (ref 150–400)
Platelets: 241 10*3/uL (ref 150–400)
RBC: 2.88 MIL/uL — ABNORMAL LOW (ref 4.22–5.81)
RBC: 3.27 MIL/uL — AB (ref 4.22–5.81)
RDW: 14.7 % (ref 11.5–15.5)
RDW: 14.8 % (ref 11.5–15.5)
WBC: 7.2 10*3/uL (ref 4.0–10.5)
WBC: 10.2 10*3/uL (ref 4.0–10.5)

## 2017-10-31 LAB — TYPE AND SCREEN
ABO/RH(D): O POS
ANTIBODY SCREEN: NEGATIVE
Unit division: 0

## 2017-10-31 LAB — COMPREHENSIVE METABOLIC PANEL
ALK PHOS: 92 U/L (ref 38–126)
ALT: 18 U/L (ref 17–63)
AST: 22 U/L (ref 15–41)
Albumin: 2.6 g/dL — ABNORMAL LOW (ref 3.5–5.0)
Anion gap: 7 (ref 5–15)
BUN: 10 mg/dL (ref 6–20)
CALCIUM: 8.5 mg/dL — AB (ref 8.9–10.3)
CO2: 22 mmol/L (ref 22–32)
CREATININE: 0.74 mg/dL (ref 0.61–1.24)
Chloride: 109 mmol/L (ref 101–111)
GFR calc non Af Amer: >60 mL/min (ref >60)
GLUCOSE: 104 mg/dL — AB (ref 65–99)
Potassium: 4.2 mmol/L (ref 3.5–5.1)
SODIUM: 138 mmol/L (ref 135–145)
Total Bilirubin: 0.7 mg/dL (ref 0.3–1.2)
Total Protein: 6 g/dL — ABNORMAL LOW (ref 6.5–8.1)

## 2017-10-31 LAB — GLUCOSE, CAPILLARY
GLUCOSE-CAPILLARY: 95 mg/dL (ref 65–99)
Glucose-Capillary: 86 mg/dL (ref 65–99)
Glucose-Capillary: 91 mg/dL (ref 65–99)
Glucose-Capillary: 94 mg/dL (ref 65–99)

## 2017-10-31 LAB — URINE CULTURE: Culture: NO GROWTH

## 2017-10-31 LAB — BPAM RBC
Blood Product Expiration Date: 201812062359
ISSUE DATE / TIME: 201811210354
Unit Type and Rh: 5100

## 2017-10-31 MED ORDER — SODIUM CHLORIDE 0.9 % IV BOLUS (SEPSIS)
500.0000 mL | Freq: Once | INTRAVENOUS | Status: AC
  Administered 2017-10-31: 500 mL via INTRAVENOUS

## 2017-10-31 NOTE — Progress Notes (Signed)
Patient ID: [name], male   DOB: 1961/05/31, 56 y.o.   MRN: [id number] 1 Day Post-Op   Subjective: Patient without complaints this morning. He had been having some black tarry stools for about 2 weeks prior to admission. No bowel movements since admission. No nausea or vomiting.  Objective: Vital signs in last 24 hours: Temp:  [98 F (36.7 C)-99 F (37.2 C)] 98.7 F (37.1 C) (11/22 0525) Pulse Rate:  [89-101] 89 (11/22 0525) Resp:  [14-21] 16 (11/22 0525) BP: (94-115)/(53-89) 99/61 (11/22 0525) SpO2:  [95 %-100 %] 95 % (11/22 0525) Weight:  [77.6 kg (171 lb 1.2 oz)] 77.6 kg (171 lb 1.2 oz) (11/22 0525) Last BM Date: 10/28/17  Intake/Output from previous day: 11/21 0701 - 11/22 0700 In: 4374.1 [P.O.:360; I.V.:3652.1; Blood:362] Out: -  Intake/Output this shift: No intake/output data recorded.  General appearance: alert, cooperative and no distress GI: Moderately obese. Soft and nontender.  Lab Results:  Recent Labs    10/30/17 1823 10/31/17 0511  WBC 8.0 7.2  HGB 7.6* 7.6*  HCT 23.9* 23.9*  PLT 262 241   BMET Recent Labs    10/30/17 0921 10/31/17 0511  NA 138 138  K 4.5 4.2  CL 109 109  CO2 21* 22  GLUCOSE 107* 104*  BUN 17 10  CREATININE 0.77 0.74  CALCIUM 8.8* 8.5*     Studies/Results: Dg Chest 2 View  Result Date: 10/29/2017 CLINICAL DATA:  Hypotension. EXAM: CHEST  2 VIEW COMPARISON:  None. FINDINGS: The heart size and mediastinal contours are within normal limits. Both lungs are clear. The visualized skeletal structures are unremarkable. IMPRESSION: No active cardiopulmonary disease. Electronically Signed   By: Ashley Royalty M.D.   On: 10/29/2017 17:56   Ct Abdomen Pelvis W Contrast  Result Date: 10/29/2017 CLINICAL DATA:  Fatigue for 1 week and epigastric pain with weight loss EXAM: CT ABDOMEN AND PELVIS WITH CONTRAST TECHNIQUE: Multidetector CT imaging of the abdomen and pelvis was performed using the standard protocol following bolus  administration of intravenous contrast. CONTRAST:  158mL ISOVUE-300 IOPAMIDOL (ISOVUE-300) INJECTION 61% COMPARISON:  None. FINDINGS: Lower chest: Lung bases are free of acute infiltrate or sizable effusion. Hepatobiliary: The gallbladder is within normal limits. The liver demonstrates multiple hypodense lesions with peripheral enhancement most consistent with metastatic disease. These do not demonstrate significant enhancement on delayed images. The largest of these in the left lobe of the liver measures approximately 5.1 cm. The largest of these in the right lobe of the liver measures approximately 5.8 cm. No biliary ductal dilatation is seen. Pancreas: The body and tail of the pancreas are within normal limits. In the region of the pancreatic head however in the head as prominent with suggestion of underlying mass lesion. This would correspond with the changes seen in the liver. There is suggestion of mass lesion measures approximately 5 cm in greatest dimension. Some associated adjacent necrotic appearing lymph nodes are seen in the region of the celiac axis (15 mm) as well as in the periaortic region and mesentery (12 mm). Peripancreatic (2.1 cm) and gastrohepatic (11 mm) adenopathy is noted as well. An adjacent duodenal diverticulum is seen with both air and fluid within. Spleen: Normal in size without focal abnormality. Adrenals/Urinary Tract: The adrenal glands are within normal limits. The kidneys demonstrates no obstructive change. Large nonobstructing right renal stone is seen measuring approximately 10 mm. The bladder is partially distended. Stomach/Bowel: Bilateral inguinal hernias are identified with loops of small bowel within the right  inguinal hernia and loops of sigmoid colon in the left inguinal hernia. No obstructive changes are seen. The appendix is within normal limits. Scattered diverticular change is noted in the colon without diverticulitis. Large duodenal diverticulum is noted adjacent to  the head of the pancreas. Vascular/Lymphatic: Atherosclerotic calcifications are identified. Additionally there are changes of lymphadenopathy along the right common iliac artery measuring approximately 12 mm in short axis. Periaortic and intra-aortocaval lymph nodes are seen as well as the previously described changes in the region of the gastrohepatic ligament as well as the peripancreatic region. Some of these nodes are intimately associated with suggested pancreatic head mass. The largest of these measures approximately 2.1 cm in short axis with central necrosis. Reproductive: Prostate is unremarkable. Other: No abdominal wall hernia or abnormality. No abdominopelvic ascites. Musculoskeletal: No acute or significant osseous findings. IMPRESSION: Changes consistent with hepatic metastatic disease and likely primary pancreatic mass in the region of the head of the pancreas. Surrounding associated lymphadenopathy is noted as described. Tissue sampling of the liver lesions is recommended. Additional workup for distant metastatic disease is recommended as well. Nonobstructing right renal stone These results were called by telephone at the time of interpretation on 10/29/2017 at 2:52 pm to [facility] , who verbally acknowledged these results. Electronically Signed   By: Inez Catalina M.D.   On: 10/29/2017 14:56   Ir US Guide Bx Asp/drain  Result Date: 10/30/2017 INDICATION: Concern for metastatic pancreatic cancer. Please perform liver lesion biopsy for tissue diagnostic purposes. EXAM: ULTRASOUND GUIDED LIVER LESION BIOPSY COMPARISON:  CT abdomen and pelvis - 11/20/202=18 MEDICATIONS: None ANESTHESIA/SEDATION: Fentanyl 75 mcg IV; Versed 2 mg IV Total Moderate Sedation time: 11 minutes. The patient's level of consciousness and vital signs were monitored continuously by radiology nursing throughout the procedure under my direct supervision. COMPLICATIONS: None immediate. PROCEDURE: Informed written  consent was obtained from the patient after a discussion of the risks, benefits and alternatives to treatment. The patient understands and consents the procedure. A timeout was performed prior to the initiation of the procedure. Ultrasound scanning was performed of the right upper abdominal quadrant demonstrates a large, at least 6.4 x 5.1 cm, lesion/mass within the right lobe liver correlating with the lesion seen on image 25, series 2 of preceding abdominal CT. The procedure was planned. The right upper abdominal quadrant was prepped and draped in the usual sterile fashion. The overlying soft tissues were anesthetized with 1% lidocaine with epinephrine. A 17 gauge, 6.8 cm co-axial needle was advanced into a peripheral aspect of the lesion. This was followed by 4 core biopsies with an 18 gauge core device under direct ultrasound guidance. The coaxial needle tract was embolized with a small amount of Gel-Foam slurry and superficial hemostasis was obtained with manual compression. Post procedural scanning was negative for definitive area of hemorrhage or additional complication. A dressing was placed. The patient tolerated the procedure well without immediate post procedural complication. IMPRESSION: Technically successful ultrasound guided core needle biopsy of indeterminate mass within the caudal aspect of the right lobe of the liver. Electronically Signed   By: Sandi Mariscal M.D.   On: 10/30/2017 17:41    Anti-infectives: Anti-infectives (From admission, onward)   None      Assessment/Plan: Unfortunate 56 year old male who presents with anemia and CT scan showing a large pancreatic mass likely eroding into duodenum and multiple liver metastases. EGD showed duodenal mass with erosion and some evidence of recent bleeding. Hemoglobin is stable since admission without evidence of  active bleeding. Has had liver biopsy and pathology pending Unfortunately there are no good surgical options for treatment of his  tumor. Palliative resection would likely not really be possible. If he has any further significant bleeding I think he would require interventional radiology for embolization. We have nothing currently to offer. We'll check again Monday and please call if the weekend if questions. Probably will need Port-A-Cath for chemotherapy at some point.    LOS: 2 days    Edward Jolly 10/31/2017

## 2017-10-31 NOTE — Progress Notes (Signed)
PROGRESS NOTE  [name]  YDX:[id number] DOB: 12-23-1960 DOA: 10/29/2017 PCP: [name], MD   Brief Narrative: [name] is a 56 y.o. male with medical history significant of hypertension, diabetes, family history of ovarian and prostate cancer, family history positive for BRCA2 gene, hyperlipidemia who presents to the ED with a 1-1/2-week history of upper abdominal pain occasionally radiating to his back, dizziness, lightheadedness, black tarry stools, generalized weakness.  Patient also endorses a 20 pound weight loss over the past 2 months with a decreased appetite. Patient had presented to his PCPs office or lab work which was obtained showed a new anemia with a hemoglobin of 8.1. He was sent to the ED where labs showed a white count of 13.5, hemoglobin of 7.8, trending down to 7.0, platelets of 356 otherwise was within normal limits.  Basic metabolic profile obtained had a sodium of 133 glucose of 111 BUN of 36 creatinine of 1.17. CT abdomen and pelvis demonstrated a pancreatic mass and multiple hepatic lesions. 1u PRBCs given with improvement to hgb 7.7g/dl. EGD 11/21 demonstrated a necrotic duodenal mass with adherent clot and bleeding and erosive esophagitis. IR was consulted for biopsy of hepatic lesion, performed 11/21. Clear liquids were started, and pt has had a single melanotic stool on 11/22.   Assessment & Plan: Principal Problem:   GI bleed Active Problems:   Diabetes type 2, controlled (HCC)   Hypertension   Symptomatic anemia   Acute blood loss anemia   Pancreatic mass: Per CT abd/pelvis 10/29/2017   Dehydration   Leukocytosis   Metastases to the liver [facility])  Acute GI bleed with acute blood loss anemia: Due to necrotic duodenal mass noted on EGD.  - Serial CBC, tranfuse for hgb < 7g/dl. - Continue IV PPI - Monitor clinically for bleeding, still having melena.  - Surgery consulted for consideration of palliative resection, not likely indicated. Will  follow peripherally.   Pancreatic mass with metastatic lesion on liver: Strong suspicion for malignancy. CA 19-9 grossly elevated at 6,905.  - Await final pathology results from EGD and liver lesion biopsy on 11/21. Will discuss with oncology.  Dehydration:  - Continue IVF's, ok to start clear liquids per GI. Further diet orders per GI.  T2DM: Well-controlled with HbA1c 6.6%.    - Hold oral hypoglycemic agents.  - SSI  DVT prophylaxis: SCDs Code Status: Full Family Communication: Sister at bedside Disposition Plan: Uncertain, likely home once hemodynamically stable.  Consultants:   [facility]  IR  Surgery  Procedures:   10/30/2017: Technically successful US guided biopsy of indeterminate mass within the right lobe of the liver by Dr. Pascal Lux.   EGD 10/30/2017 by Dr. Silverio Decamp: - LA Grade C (one or more mucosal breaks continuous between tops of 2 or more mucosal folds, less than 75% circumference) esophagitis with no bleeding was found 34 to 38 cm from the incisors. - The stomach was normal. - A large infiltrative and ulcerated mass with bleeding, large adherent clot, involving >50 % luminal surface area 5-7cm in length was found in the second portion of the duodenum. Biopsies were taken with a coldforceps for histology. Not amenable to endoscopic intervention to control the bleeding.  Antimicrobials:  None   Subjective: Had no pain over past 24 hours, no problems with clear diet including no nausea or vomiting. Had thick black stool this morning. No lightheadedness. Ambulating fine.  Objective: Vitals:   10/30/17 1809 10/30/17 2159 10/31/17 0525 10/31/17 1430  BP: (!) 109/54  114/70 99/61 100/60  Pulse: 98 93 89 90  Resp: 18 18 16 16   Temp: 98.9 F (37.2 C) 98.4 F (36.9 C) 98.7 F (37.1 C) 98.8 F (37.1 C)  TempSrc: Oral Oral Oral Oral  SpO2: 96% 97% 95% 98%  Weight:   77.6 kg (171 lb 1.2 oz)   Height:        Intake/Output Summary (Last 24 hours) at 10/31/2017  1609 Last data filed at 10/31/2017 1400 Gross per 24 hour  Intake 4212.08 ml  Output -  Net 4212.08 ml   Filed Weights   10/29/17 1820 10/31/17 0525  Weight: 76.9 kg (169 lb 9.6 oz) 77.6 kg (171 lb 1.2 oz)    Gen: 56 y.o. male in no distress  Pulm: Non-labored breathing room air. Clear to auscultation bilaterally.  CV: Regular borderline tachycardia. No murmur, rub, or gallop. No JVD, no pedal edema. GI: Abdomen soft, mild epigastric tenderness without masses palpated, non-distended, with normoactive bowel sounds. No organomegaly or masses felt. Ext: Warm, no deformities Skin: Diffuse pallor, otherwise no rashes or wounds.  Neuro: Alert and oriented. No focal neurological deficits. Psych: Judgement and insight appear normal. Mood & affect appropriate.   Data Reviewed: I have personally reviewed following labs and imaging studies  CBC: Recent Labs  Lab 10/29/17 1818 10/30/17 0106 10/30/17 0921 10/30/17 1823 10/31/17 0511  WBC 11.7* 8.7 8.6 8.0 7.2  NEUTROABS 9.5* 6.8 7.0 6.5 5.6  HGB 7.6* 7.0* 7.7* 7.6* 7.6*  HCT 24.4* 22.0* 24.0* 23.9* 23.9*  MCV 83.8 83.0 82.2 82.7 83.0  PLT 335 260 270 262 010   Basic Metabolic Panel: Recent Labs  Lab 10/29/17 1151 10/29/17 1337 10/29/17 1818 10/30/17 0106 10/30/17 0921 10/31/17 0511  NA 134* 133*  --  135 138 138  K 6.0* 5.1  --  5.0 4.5 4.2  CL 102 101  --  107 109 109  CO2 18* 23  --  21* 21* 22  GLUCOSE 119* 111*  --  104* 107* 104*  BUN 36* 36*  --  22* 17 10  CREATININE 1.18 1.17  --  0.94 0.77 0.74  CALCIUM 9.7 9.5  --  8.7* 8.8* 8.5*  MG  --   --  2.0  --   --   --    GFR: Estimated Creatinine Clearance: 102.3 mL/min (by C-G formula based on SCr of 0.74 mg/dL). Liver Function Tests: Recent Labs  Lab 10/29/17 1151 10/30/17 0106 10/30/17 0921 10/31/17 0511  AST 44* 24 21 22   ALT 23 20 18 18   ALKPHOS 113 96 93 92  BILITOT 0.8 0.4 1.0 0.7  PROT 6.9 6.2* 6.2* 6.0*  ALBUMIN 3.2* 2.8* 2.8* 2.6*   Recent Labs   Lab 10/29/17 1337  LIPASE 45   No results for input(s): AMMONIA in the last 168 hours. Coagulation Profile: Recent Labs  Lab 10/30/17 0106  INR 1.09   Cardiac Enzymes: Recent Labs  Lab 10/29/17 1337  TROPONINI <0.03   BNP (last 3 results) No results for input(s): PROBNP in the last 8760 hours. HbA1C: Recent Labs    10/29/17 1117 10/29/17 1818  HGBA1C 6.7* 6.6*   CBG: Recent Labs  Lab 10/30/17 1218 10/30/17 1817 10/30/17 2157 10/31/17 0726 10/31/17 1141  GLUCAP 91 95 99 86 95   Lipid Profile: No results for input(s): CHOL, HDL, LDLCALC, TRIG, CHOLHDL, LDLDIRECT in the last 72 hours. Thyroid Function Tests: No results for input(s): TSH, T4TOTAL, FREET4, T3FREE, THYROIDAB in the last 72 hours.  Anemia Panel: Recent Labs    10/29/17 1818  VITAMINB12 1,499*  FOLATE 17.0  FERRITIN 92  TIBC 382  IRON 19*  RETICCTPCT 2.4   Urine analysis:    Component Value Date/Time   COLORURINE STRAW (A) 10/30/2017 0434   APPEARANCEUR CLEAR 10/30/2017 0434   LABSPEC 1.012 10/30/2017 0434   PHURINE 5.0 10/30/2017 0434   GLUCOSEU NEGATIVE 10/30/2017 0434   HGBUR NEGATIVE 10/30/2017 0434   BILIRUBINUR NEGATIVE 10/30/2017 0434   KETONESUR 5 (A) 10/30/2017 [place] NEGATIVE 10/30/2017 0434   NITRITE NEGATIVE 10/30/2017 0434   LEUKOCYTESUR NEGATIVE 10/30/2017 0434   Recent Results (from the past 240 hour(s))  Culture, Urine     Status: None   Collection Time: 10/30/17  4:34 AM  Result Value Ref Range Status   Specimen Description URINE, CLEAN CATCH  Final   Special Requests NONE  Final   Culture   Final    NO GROWTH Performed at [facility], [place] [address], [place], [place] [address]    Report Status 10/31/2017 FINAL  Final      Radiology Studies: Dg Chest 2 View  Result Date: 10/29/2017 CLINICAL DATA:  Hypotension. EXAM: CHEST  2 VIEW COMPARISON:  None. FINDINGS: The heart size and mediastinal contours are within normal limits. Both lungs are  clear. The visualized skeletal structures are unremarkable. IMPRESSION: No active cardiopulmonary disease. Electronically Signed   By: Ashley Royalty M.D.   On: 10/29/2017 17:56   Ir US Guide Bx Asp/drain  Result Date: 10/30/2017 INDICATION: Concern for metastatic pancreatic cancer. Please perform liver lesion biopsy for tissue diagnostic purposes. EXAM: ULTRASOUND GUIDED LIVER LESION BIOPSY COMPARISON:  CT abdomen and pelvis - 11/20/202=18 MEDICATIONS: None ANESTHESIA/SEDATION: Fentanyl 75 mcg IV; Versed 2 mg IV Total Moderate Sedation time: 11 minutes. The patient's level of consciousness and vital signs were monitored continuously by radiology nursing throughout the procedure under my direct supervision. COMPLICATIONS: None immediate. PROCEDURE: Informed written consent was obtained from the patient after a discussion of the risks, benefits and alternatives to treatment. The patient understands and consents the procedure. A timeout was performed prior to the initiation of the procedure. Ultrasound scanning was performed of the right upper abdominal quadrant demonstrates a large, at least 6.4 x 5.1 cm, lesion/mass within the right lobe liver correlating with the lesion seen on image 25, series 2 of preceding abdominal CT. The procedure was planned. The right upper abdominal quadrant was prepped and draped in the usual sterile fashion. The overlying soft tissues were anesthetized with 1% lidocaine with epinephrine. A 17 gauge, 6.8 cm co-axial needle was advanced into a peripheral aspect of the lesion. This was followed by 4 core biopsies with an 18 gauge core device under direct ultrasound guidance. The coaxial needle tract was embolized with a small amount of Gel-Foam slurry and superficial hemostasis was obtained with manual compression. Post procedural scanning was negative for definitive area of hemorrhage or additional complication. A dressing was placed. The patient tolerated the procedure well without  immediate post procedural complication. IMPRESSION: Technically successful ultrasound guided core needle biopsy of indeterminate mass within the caudal aspect of the right lobe of the liver. Electronically Signed   By: Sandi Mariscal M.D.   On: 10/30/2017 17:41    Scheduled Meds: . insulin aspart  0-5 Units Subcutaneous QHS  . insulin aspart  0-9 Units Subcutaneous TID WC  . multivitamin with minerals  1 tablet Oral Daily  . [START ON 11/02/2017] pantoprazole  40 mg Intravenous Q12H   Continuous Infusions: . sodium chloride 125 mL/hr at 10/31/17 1549  . pantoprozole (PROTONIX) infusion 8 mg/hr (10/31/17 1549)     LOS: 2 days   Time spent: 25 minutes.  Vance Gather, MD [facility] Pager [phone]  If 7PM-7AM, please contact night-coverage [email or web address] Password [facility] 10/31/2017, 4:09 PM

## 2017-10-31 NOTE — Progress Notes (Signed)
PT Cancellation Note  Patient Details Name: [name] MRN: [id number] DOB: 1961/09/16   Cancelled Treatment:    Reason Eval/Treat Not Completed: PT screened, no needs identified, will sign off. Spoke with pt who denied need for PT services. Will sign off at pt's request.    Weston Anna, MPT Pager: [phone]

## 2017-11-01 ENCOUNTER — Encounter (HOSPITAL_COMMUNITY): Payer: Self-pay | Admitting: Gastroenterology

## 2017-11-01 DIAGNOSIS — C259 Malignant neoplasm of pancreas, unspecified: Secondary | ICD-10-CM

## 2017-11-01 DIAGNOSIS — C787 Secondary malignant neoplasm of liver and intrahepatic bile duct: Secondary | ICD-10-CM

## 2017-11-01 DIAGNOSIS — K269 Duodenal ulcer, unspecified as acute or chronic, without hemorrhage or perforation: Secondary | ICD-10-CM

## 2017-11-01 LAB — CBC WITH DIFFERENTIAL/PLATELET
Basophils Absolute: 0 10*3/uL (ref 0.0–0.1)
Basophils Relative: 0 %
EOS ABS: 0.2 10*3/uL (ref 0.0–0.7)
EOS PCT: 2 %
HCT: 23.1 % — ABNORMAL LOW (ref 39.0–52.0)
Hemoglobin: 7.4 g/dL — ABNORMAL LOW (ref 13.0–17.0)
LYMPHS ABS: 0.8 10*3/uL (ref 0.7–4.0)
Lymphocytes Relative: 10 %
MCH: 26.9 pg (ref 26.0–34.0)
MCHC: 32 g/dL (ref 30.0–36.0)
MCV: 84 fL (ref 78.0–100.0)
MONO ABS: 0.5 10*3/uL (ref 0.1–1.0)
MONOS PCT: 6 %
Neutro Abs: 6.4 10*3/uL (ref 1.7–7.7)
Neutrophils Relative %: 82 %
PLATELETS: 239 10*3/uL (ref 150–400)
RBC: 2.75 MIL/uL — ABNORMAL LOW (ref 4.22–5.81)
RDW: 15.1 % (ref 11.5–15.5)
WBC: 7.8 10*3/uL (ref 4.0–10.5)

## 2017-11-01 LAB — COMPREHENSIVE METABOLIC PANEL
ALT: 15 U/L — AB (ref 17–63)
AST: 23 U/L (ref 15–41)
Albumin: 2.5 g/dL — ABNORMAL LOW (ref 3.5–5.0)
Alkaline Phosphatase: 92 U/L (ref 38–126)
Anion gap: 6 (ref 5–15)
BUN: 6 mg/dL (ref 6–20)
CHLORIDE: 111 mmol/L (ref 101–111)
CO2: 22 mmol/L (ref 22–32)
CREATININE: 0.75 mg/dL (ref 0.61–1.24)
Calcium: 8.3 mg/dL — ABNORMAL LOW (ref 8.9–10.3)
GFR calc Af Amer: >60 mL/min (ref >60)
GFR calc non Af Amer: >60 mL/min (ref >60)
Glucose, Bld: 104 mg/dL — ABNORMAL HIGH (ref 65–99)
Potassium: 3.9 mmol/L (ref 3.5–5.1)
SODIUM: 139 mmol/L (ref 135–145)
Total Bilirubin: 0.6 mg/dL (ref 0.3–1.2)
Total Protein: 5.7 g/dL — ABNORMAL LOW (ref 6.5–8.1)

## 2017-11-01 LAB — GLUCOSE, CAPILLARY
GLUCOSE-CAPILLARY: 99 mg/dL (ref 65–99)
Glucose-Capillary: 99 mg/dL (ref 65–99)
Glucose-Capillary: 106 mg/dL — ABNORMAL HIGH (ref 65–99)
Glucose-Capillary: 128 mg/dL — ABNORMAL HIGH (ref 65–99)

## 2017-11-01 MED ORDER — SODIUM CHLORIDE 0.9 % IV BOLUS (SEPSIS)
500.0000 mL | Freq: Once | INTRAVENOUS | Status: AC
  Administered 2017-11-01: 500 mL via INTRAVENOUS

## 2017-11-01 NOTE — Consult Note (Signed)
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 Baseline hemoglobin recorded prior to admission was 15.8, on admission hemoglobin found to be 7.8 with normal platelet and Elevated white blood cell counts.Due to abdominal complaints,Abdominal imaging and G.I. Evaluation were undertaken. CT of the abdomen demonstrated a mass in the head of the pancreas abutting duodenum, Their pancreatic lymphadenopathy, and multifocal peripherally enhancing, Centrally hypodense lesions in the liver consistent with metastatic disease. [id number] on admission. Subsequently, patient underwent an EGD on 10/30/17 which demonstrated LA Grade C esophagitis without bleeding, as well as a large infiltrative ulcerated mass with active bleeding and clot in the second portion of duodenum.Biopsy confirmed presence of poorly differentiated adenocarcinoma, morphologically consistent with pancreatic primary. At this time, patient is hemodynamically stable, but hemoglobin remains tenuous. [name] usually has responded to transfusion, but dropped from yesterday to today with possible recurrence of bleeding.  At the present time, patient denies any fever, chilled, night sweats. He has lost significant amount weight, continues to be fatigued and weak. Denies active chest pain, palpitations, or shortness of breath. No cough or hemoptysis. No hematemesis. Denies a neurological, dermatological, or urological complaints.      Past  Medical History:  Diagnosis Date  . BRCA2 positive   . Diabetes mellitus 2008  . Family history of BRCA2 gene positive   . Family history of ovarian cancer   . Family history of prostate cancer   . Fatty liver 2008   Fatty liver, saw GI    . Hyperlipidemia   . Hypertension   :  Past Surgical History:  Procedure Laterality Date  . ESOPHAGOGASTRODUODENOSCOPY (EGD) WITH PROPOFOL N/A 10/30/2017   Procedure: ESOPHAGOGASTRODUODENOSCOPY (EGD) WITH PROPOFOL;  Surgeon: Mauri Pole, MD;  Location: [facility];  Service: Endoscopy;  Laterality: N/A;  . IR US GUIDE BX ASP/DRAIN  10/30/2017  . NO PAST SURGERIES    :   Current Facility-Administered Medications:  .  0.9 %  sodium chloride infusion, , Intravenous, Continuous, Eugenie Filler, MD, Last Rate: 125 mL/hr at 11/01/17 1936 .  acetaminophen (TYLENOL) tablet 650 mg, 650 mg, Oral, Q6H PRN **OR** acetaminophen (TYLENOL) suppository 650 mg, 650 mg, Rectal, Q6H PRN, Eugenie Filler, MD .  insulin aspart (novoLOG) injection 0-5 Units, 0-5 Units, Subcutaneous, QHS, Eugenie Filler, MD .  insulin aspart (novoLOG) injection 0-9 Units, 0-9 Units, Subcutaneous, TID WC, Eugenie Filler, MD .  multivitamin with minerals tablet 1 tablet, 1 tablet, Oral, Daily, Eugenie Filler, MD, 1 tablet at 11/01/17 [phone] .  ondansetron (ZOFRAN) tablet 4 mg, 4 mg, Oral, Q6H PRN **OR** ondansetron (ZOFRAN) injection 4 mg, 4 mg, Intravenous, Q6H PRN, Eugenie Filler, MD .  oxymetazoline (AFRIN) 0.05 % nasal spray 1 spray, 1 spray, Each Nare, BID PRN, Schorr, Rhetta Mura, NP, 1 spray at 10/30/17 0200 .  [START ON 11/02/2017] pantoprazole (PROTONIX) injection 40 mg, 40 mg, Intravenous, Q12H, Eugenie Filler, MD .  sorbitol 70 % solution 30 mL, 30 mL, Oral, Daily PRN, Irine Seal  V, MD .  traMADol (ULTRAM) tablet 50 mg, 50 mg, Oral, Q6H PRN, Eugenie Filler, MD:  . insulin aspart  0-5 Units Subcutaneous QHS  . insulin aspart  0-9  Units Subcutaneous TID WC  . multivitamin with minerals  1 tablet Oral Daily  . [START ON 11/02/2017] pantoprazole  40 mg Intravenous Q12H  :  No Known Allergies:   Family History  Problem Relation Age of Onset  . Diabetes Father   . Lung cancer Father        smoker  . Stroke Mother   . Ovarian cancer Sister 36  . BRCA 1/2 Sister        BRCA2 pos  . Leukemia Maternal Aunt   . Stomach cancer Maternal Grandfather   . Prostate cancer Paternal Grandfather   . Melanoma Sister   . BRCA 1/2 Sister        BRCA2 negative  . Leukemia Cousin        maternal first cousin  . Heart attack Neg Hx   . Colon cancer Neg Hx       Social History   Socioeconomic History  . Marital status: Single    Spouse name: Not on file  . Number of children: 0  . Years of education: Not on file  . Highest education level: Not on file  Social Needs  . Financial resource strain: Not on file  . Food insecurity - worry: Not on file  . Food insecurity - inability: Not on file  . Transportation needs - medical: Not on file  . Transportation needs - non-medical: Not on file  Occupational History  . Occupation: [employer]    Employer: [employer]  Tobacco Use  . Smoking status: Never Smoker  . Smokeless tobacco: Never Used  Substance and Sexual Activity  . Alcohol use: No  . Drug use: No  . Sexual activity: Not on file  Other Topics Concern  . Not on file  Social History Narrative   Lives by himself                  Review of Systems: As per HPI. All systems are negative   Physical Exam:  Blood pressure 114/63, pulse 96, temperature 98.6 F (37 C), temperature source Oral, resp. rate 18, height 5' 6"  (1.676 m), weight 171 lb 1.2 oz (77.6 kg), SpO2 98 %.  Patient appears fatigued, AAOx3 HEENT: Anicteric, most mucous membranes Lungs: Clear to auscultation bilaterally Cardiac: S1/S2, regular, no murmurs Abdomen: Soft, non-tender, non-distended. No voluminous ascites by physical  examination Lymph nodes: No palpable lymphadenopathy Neurologic: No gross focal neurological deficits Skin: No jaundice. Musculoskeletal: Muscle wasting noted.  LABS:  Recent Labs    10/31/17 1732 11/01/17 0439  WBC 10.2 7.8  HGB 8.5* 7.4*  HCT 27.5* 23.1*  PLT 294 239    Recent Labs    10/31/17 0511 11/01/17 0439  NA 138 139  K 4.2 3.9  CL 109 111  CO2 22 22  GLUCOSE 104* 104*  BUN 10 6  CREATININE 0.74 0.75  CALCIUM 8.5* 8.3*      RADIOLOGY:  Dg Chest 2 View  Result Date: 10/29/2017 CLINICAL DATA:  Hypotension. EXAM: CHEST  2 VIEW COMPARISON:  None. FINDINGS: The heart size and mediastinal contours are within normal limits. Both lungs are clear. The visualized skeletal structures are unremarkable. IMPRESSION: No active cardiopulmonary disease. Electronically Signed   By: Ashley Royalty M.D.   On: 10/29/2017 17:56  Ct Abdomen Pelvis W Contrast  Result Date: 10/29/2017 CLINICAL DATA:  Fatigue for 1 week and epigastric pain with weight loss EXAM: CT ABDOMEN AND PELVIS WITH CONTRAST TECHNIQUE: Multidetector CT imaging of the abdomen and pelvis was performed using the standard protocol following bolus administration of intravenous contrast. CONTRAST:  152m ISOVUE-300 IOPAMIDOL (ISOVUE-300) INJECTION 61% COMPARISON:  None. FINDINGS: Lower chest: Lung bases are free of acute infiltrate or sizable effusion. Hepatobiliary: The gallbladder is within normal limits. The liver demonstrates multiple hypodense lesions with peripheral enhancement most consistent with metastatic disease. These do not demonstrate significant enhancement on delayed images. The largest of these in the left lobe of the liver measures approximately 5.1 cm. The largest of these in the right lobe of the liver measures approximately 5.8 cm. No biliary ductal dilatation is seen. Pancreas: The body and tail of the pancreas are within normal limits. In the region of the pancreatic head however in the head as  prominent with suggestion of underlying mass lesion. This would correspond with the changes seen in the liver. There is suggestion of mass lesion measures approximately 5 cm in greatest dimension. Some associated adjacent necrotic appearing lymph nodes are seen in the region of the celiac axis (15 mm) as well as in the periaortic region and mesentery (12 mm). Peripancreatic (2.1 cm) and gastrohepatic (11 mm) adenopathy is noted as well. An adjacent duodenal diverticulum is seen with both air and fluid within. Spleen: Normal in size without focal abnormality. Adrenals/Urinary Tract: The adrenal glands are within normal limits. The kidneys demonstrates no obstructive change. Large nonobstructing right renal stone is seen measuring approximately 10 mm. The bladder is partially distended. Stomach/Bowel: Bilateral inguinal hernias are identified with loops of small bowel within the right inguinal hernia and loops of sigmoid colon in the left inguinal hernia. No obstructive changes are seen. The appendix is within normal limits. Scattered diverticular change is noted in the colon without diverticulitis. Large duodenal diverticulum is noted adjacent to the head of the pancreas. Vascular/Lymphatic: Atherosclerotic calcifications are identified. Additionally there are changes of lymphadenopathy along the right common iliac artery measuring approximately 12 mm in short axis. Periaortic and intra-aortocaval lymph nodes are seen as well as the previously described changes in the region of the gastrohepatic ligament as well as the peripancreatic region. Some of these nodes are intimately associated with suggested pancreatic head mass. The largest of these measures approximately 2.1 cm in short axis with central necrosis. Reproductive: Prostate is unremarkable. Other: No abdominal wall hernia or abnormality. No abdominopelvic ascites. Musculoskeletal: No acute or significant osseous findings. IMPRESSION: Changes consistent with  hepatic metastatic disease and likely primary pancreatic mass in the region of the head of the pancreas. Surrounding associated lymphadenopathy is noted as described. Tissue sampling of the liver lesions is recommended. Additional workup for distant metastatic disease is recommended as well. Nonobstructing right renal stone These results were called by telephone at the time of interpretation on 10/29/2017 at 2:52 pm [facility] , who verbally acknowledged these results. Electronically Signed   By: MInez CatalinaM.D.   On: 10/29/2017 14:56   Ir [place]Guide Bx Asp/drain  Result Date: 10/30/2017 INDICATION: Concern for metastatic pancreatic cancer. Please perform liver lesion biopsy for tissue diagnostic purposes. EXAM: ULTRASOUND GUIDED LIVER LESION BIOPSY COMPARISON:  CT abdomen and pelvis - 11/20/202=18 MEDICATIONS: None ANESTHESIA/SEDATION: Fentanyl 75 mcg IV; Versed 2 mg IV Total Moderate Sedation time: 11 minutes. The patient's level of consciousness and vital signs were  monitored continuously by radiology nursing throughout the procedure under my direct supervision. COMPLICATIONS: None immediate. PROCEDURE: Informed written consent was obtained from the patient after a discussion of the risks, benefits and alternatives to treatment. The patient understands and consents the procedure. A timeout was performed prior to the initiation of the procedure. Ultrasound scanning was performed of the right upper abdominal quadrant demonstrates a large, at least 6.4 x 5.1 cm, lesion/mass within the right lobe liver correlating with the lesion seen on image 25, series 2 of preceding abdominal CT. The procedure was planned. The right upper abdominal quadrant was prepped and draped in the usual sterile fashion. The overlying soft tissues were anesthetized with 1% lidocaine with epinephrine. A 17 gauge, 6.8 cm co-axial needle was advanced into a peripheral aspect of the lesion. This was followed by 4 core biopsies  with an 18 gauge core device under direct ultrasound guidance. The coaxial needle tract was embolized with a small amount of Gel-Foam slurry and superficial hemostasis was obtained with manual compression. Post procedural scanning was negative for definitive area of hemorrhage or additional complication. A dressing was placed. The patient tolerated the procedure well without immediate post procedural complication. IMPRESSION: Technically successful ultrasound guided core needle biopsy of indeterminate mass within the caudal aspect of the right lobe of the liver. Electronically Signed   By: Sandi Mariscal M.D.   On: 10/30/2017 17:41    Assessment and Plan:  56yo male with new diagnosis of adenocarcinoma with pancreatic head with metastatic disease suspected in the liver and regional louvered notes. Patient is awaiting completion staging with CT of the chest. Additional history significant for presence of germline BRCA2 mutation with Corresponding oncological history in the family. Based on the origin on the extent of the disease, patient has a stage IV pancreatic cancer which is not a curable disease at this time. With that in mind, any approach to treatment will be palliative in nature. We do have a new class of medications called PARP inhibitors (such as Olaparib) with specific activity in BRCA-driven malignancies such as ovarian, breast, prostate, and pancreatic carcinomas, currently the strategy would be to start treatment with a first-line gemcitabine-containing or platinum-containing regimens and to consider clinical trial enrollment subsequently.   At the present time, decision needs to be made by the patient whether he would like to pursue palliative systemic therapies we each have shown to have benefits or symptom relief and extension of survival with potential to improve quality of life as compared to best supportive care. Additional options include palliative radiation to the head of the pancreas to  attempt and decrease the bleeding. Patient has negative perception of systemic chemotherapy based on experience of his sister with ovarian cancer and therapies.  Recommendations: --CT chest --Continue transfusion and supportive care -- I will let patient absorb the information provided today and will revisit systemic therapy in short order. If you choose to proceed with the treatment, will consider placing Infusaport while still impatient and potentially starting systemic therapy while still hospitalized in attempt to reduce the gastrointestinal blood loss via cytoreduction. --I will see the patient again tomorrow and will either continue following him myself or handing them over to a gastrointestinal oncology on Monday.   Ardath Sax, MD 11/01/2017, 10:35 PM

## 2017-11-01 NOTE — Care Management Note (Signed)
Case Management Note  Patient Details  Name: [name] MRN: [id number] Date of Birth: 04-18-1961  Subjective/Objective: 56 y/o m admitted w/GIB. From home.                   Action/Plan:d/c plan home.   Expected Discharge Date:  (UNKNOWN)               Expected Discharge Plan:  Home/Self Care  In-House Referral:     Discharge planning Services  CM Consult  Post Acute Care Choice:    Choice offered to:     DME Arranged:    DME Agency:     HH Arranged:    HH Agency:     Status of Service:  In process, will continue to follow  If discussed at Long Length of Stay Meetings, dates discussed:    Additional Comments:  Dessa Phi, RN 11/01/2017, 12:20 PM

## 2017-11-01 NOTE — Progress Notes (Signed)
Progress Note   Subjective  Chief Complaint: Pancreatic cancer, melena, anemia  Patient reports feeling fairly well this morning.  He reports no further episodes of melena.  He has no new complaints.   Objective   Vital signs in last 24 hours: Temp:  [98 F (36.7 C)-98.9 F (37.2 C)] 98 F (36.7 C) (11/23 0623) Pulse Rate:  [89-90] 89 (11/23 0623) Resp:  [16-18] 18 (11/23 0623) BP: (86-101)/(50-61) 101/61 (11/23 0752) SpO2:  [96 %-98 %] 98 % (11/23 0623) Weight:  [171 lb 1.2 oz (77.6 kg)] 171 lb 1.2 oz (77.6 kg) (11/23 0623) Last BM Date: 10/31/17 General:    Pale Caucasian male in NAD Heart:  Regular rate and rhythm; no murmurs Lungs: Respirations even and unlabored, lungs CTA bilaterally Abdomen:  Soft, nontender and nondistended. Normal bowel sounds. Extremities:  Without edema. Neurologic:  Alert and oriented,  grossly normal neurologically. Psych:  Cooperative. Normal mood and affect.  Intake/Output from previous day: 11/22 0701 - 11/23 0700 In: 5247.1 [P.O.:1560; I.V.:3687.1] Out: -  Intake/Output this shift: No intake/output data recorded.  Lab Results: Recent Labs    10/31/17 0511 10/31/17 1732 11/01/17 0439  WBC 7.2 10.2 7.8  HGB 7.6* 8.5* 7.4*  HCT 23.9* 27.5* 23.1*  PLT 241 294 239   BMET Recent Labs    10/30/17 0921 10/31/17 0511 11/01/17 0439  NA 138 138 139  K 4.5 4.2 3.9  CL 109 109 111  CO2 21* 22 22  GLUCOSE 107* 104* 104*  BUN 17 10 6   CREATININE 0.77 0.74 0.75  CALCIUM 8.8* 8.5* 8.3*   LFT Recent Labs    11/01/17 0439  PROT 5.7*  ALBUMIN 2.5*  AST 23  ALT 15*  ALKPHOS 92  BILITOT 0.6   PT/INR Recent Labs    10/30/17 0106  LABPROT 14.0  INR 1.09    Studies/Results: Ir US Guide Bx Asp/drain  Result Date: 10/30/2017 INDICATION: Concern for metastatic pancreatic cancer. Please perform liver lesion biopsy for tissue diagnostic purposes. EXAM: ULTRASOUND GUIDED LIVER LESION BIOPSY COMPARISON:  CT abdomen and pelvis  - 11/20/202=18 MEDICATIONS: None ANESTHESIA/SEDATION: Fentanyl 75 mcg IV; Versed 2 mg IV Total Moderate Sedation time: 11 minutes. The patient's level of consciousness and vital signs were monitored continuously by radiology nursing throughout the procedure under my direct supervision. COMPLICATIONS: None immediate. PROCEDURE: Informed written consent was obtained from the patient after a discussion of the risks, benefits and alternatives to treatment. The patient understands and consents the procedure. A timeout was performed prior to the initiation of the procedure. Ultrasound scanning was performed of the right upper abdominal quadrant demonstrates a large, at least 6.4 x 5.1 cm, lesion/mass within the right lobe liver correlating with the lesion seen on image 25, series 2 of preceding abdominal CT. The procedure was planned. The right upper abdominal quadrant was prepped and draped in the usual sterile fashion. The overlying soft tissues were anesthetized with 1% lidocaine with epinephrine. A 17 gauge, 6.8 cm co-axial needle was advanced into a peripheral aspect of the lesion. This was followed by 4 core biopsies with an 18 gauge core device under direct ultrasound guidance. The coaxial needle tract was embolized with a small amount of Gel-Foam slurry and superficial hemostasis was obtained with manual compression. Post procedural scanning was negative for definitive area of hemorrhage or additional complication. A dressing was placed. The patient tolerated the procedure well without immediate post procedural complication. IMPRESSION: Technically successful ultrasound guided core needle biopsy of  indeterminate mass within the caudal aspect of the right lobe of the liver. Electronically Signed   By: Sandi Mariscal M.D.   On: 10/30/2017 17:41       Assessment / Plan:   Assessment: 1.  Pancreatic cancer: EGD 11/21 demonstrated a necrotic duodenal mass with adherent clot and bleeding, pathology positive for  infiltrative pancreatic cancer, discussed with the patient today, will consult oncology  2.  Melena: with above 3.  Acute blood loss anemia: with above   Plan: 1.  Reviewed the pathology findings of pancreatic cancer which has infiltrated the patient's duodenum with him this morning.  Patient was allowed time to ask questions.  He does request that he sees oncology and is able to "discuss options". 2.  Liver biopsies are still pending which will assist with staging in the future 3.  Continue supportive measures 4.  Patient may require further blood transfusion, would recommend continuing to monitor his hemoglobin while here, we will leave this to the hospitalist service 5.  Consulted oncology 6.  Advanced patient to a soft diet today 7.  Please await final recommendations from Dr. Silverio Decamp later today, we will likely sign off today  Thank you for your kind consultation.    LOS: 3 days   Levin Erp  11/01/2017, 10:43 AM  Pager # [phone]   Attending physician's note   I have taken an interval history, reviewed the chart and examined the patient. I agree with the Advanced Practitioner's note, impression and recommendations.  Duodenal biopsies of infiltrative bleeding, non obstructive mass confirmed adenocarcinoma likely pancreatic origin. Await liver biopsies to confirm staging Consult Oncology Patient requesting regular diet, ok to advance diet. Advised patient to avoid raw vegetable or high fiber diet Will sign off, available for any questions   K Denzil Magnuson, MD [phone] Mon-Fri 8a-5p [phone] after 5p, weekends, holidays

## 2017-11-01 NOTE — Progress Notes (Signed)
PROGRESS NOTE  [name]  SWH:[id number] DOB: [birth year] DOA: 10/29/2017 PCP: [name], MD   Brief Narrative: [name] is a 56 y.o. male with medical history significant of hypertension, diabetes, family history of ovarian and prostate cancer, family history positive for BRCA2 gene, hyperlipidemia who presents to the ED with a 1-1/2-week history of upper abdominal pain occasionally radiating to his back, dizziness, lightheadedness, black tarry stools, generalized weakness.  Patient also endorses a 20 pound weight loss over the past 2 months with a decreased appetite. Patient had presented to his PCPs office or lab work which was obtained showed a new anemia with a hemoglobin of 8.1. He was sent to the ED where labs showed a white count of 13.5, hemoglobin of 7.8, trending down to 7.0, platelets of 356 otherwise was within normal limits.  Basic metabolic profile obtained had a sodium of 133 glucose of 111 BUN of 36 creatinine of 1.17. CT abdomen and pelvis demonstrated a pancreatic mass and multiple hepatic lesions. 1u PRBCs given with improvement to hgb 7.7g/dl. EGD 11/21 demonstrated a necrotic duodenal mass with adherent clot and bleeding and erosive esophagitis. IR was consulted for biopsy of hepatic lesion, performed 11/21. Clear liquids were started, and pt has had a single melanotic stool on 11/22.   Assessment & Plan: Principal Problem:   GI bleed Active Problems:   Diabetes type 2, controlled ([place])   Hypertension   Symptomatic anemia   Acute blood loss anemia   Pancreatic mass: Per CT abd/pelvis 10/29/2017   Dehydration   Leukocytosis   Metastases to the liver [facility])  Acute GI bleed with acute blood loss anemia: Due to necrotic duodenal mass noted on EGD.  - Continue PPI, ok to advance diet per GI (signed off 11/23) - Monitor clinically for bleeding in AM, tranfuse for hgb < 7g/dl. - Surgery consulted for consideration of palliative resection, not likely  indicated. Will follow peripherally. Suspect they will need to be involved for port placement depending on discussions with oncology.  Pancreatic mass eroding into duodenum with likely metastatic lesion on liver: Initial pathology consistent with adenocarcinoma, likely pancreatic. CA 19-9 grossly elevated at 6,905.  - I've asked oncology, Dr. Lebron Conners to see the patient today.  - Awaiting liver biopsy pathology.  Dehydration:  - Continue IVF's for now  T2DM: Well-controlled with HbA1c 6.6%.    - Hold oral hypoglycemic agents.  - SSI  DVT prophylaxis: SCDs Code Status: Full Family Communication: None at bedside this AM Disposition Plan: Uncertain, likely home once hemodynamically stable.  Consultants:   [facility]  IR  Surgery  Procedures:   10/30/2017: Technically successful US guided biopsy of indeterminate mass within the right lobe of the liver by Dr. Pascal Lux.   EGD 10/30/2017 by Dr. Silverio Decamp: - LA Grade C (one or more mucosal breaks continuous between tops of 2 or more mucosal folds, less than 75% circumference) esophagitis with no bleeding was found 34 to 38 cm from the incisors. - The stomach was normal. - A large infiltrative and ulcerated mass with bleeding, large adherent clot, involving >50 % luminal surface area 5-7cm in length was found in the second portion of the duodenum. Biopsies were taken with a coldforceps for histology. Not amenable to endoscopic intervention to control the bleeding.  Antimicrobials:  None   Subjective: Had 2 BMs that were described as dark, but not as dark as previously. Tolerating liquids. Had asymptomatic hypotension last night, responsive to IV fluid bolus. He's taking  the new of pancreatic cancer well. Confirms that he has significant social support.  Objective: Vitals:   11/01/17 0623 11/01/17 0752 11/01/17 1000 11/01/17 1515  BP: (!) 86/50 101/61 102/70 114/63  Pulse: 89   96  Resp: 18   18  Temp: 98 F (36.7 C)   98.6 F (37  C)  TempSrc: Oral   Oral  SpO2: 98%   98%  Weight: 77.6 kg (171 lb 1.2 oz)     Height:        Intake/Output Summary (Last 24 hours) at 11/01/2017 1537 Last data filed at 11/01/2017 1400 Gross per 24 hour  Intake 4647.08 ml  Output -  Net 4647.08 ml   Filed Weights   10/29/17 1820 10/31/17 0525 11/01/17 0623  Weight: 76.9 kg (169 lb 9.6 oz) 77.6 kg (171 lb 1.2 oz) 77.6 kg (171 lb 1.2 oz)    Gen: 56 y.o. male in no distress  Pulm: Non-labored breathing room air. Clear to auscultation bilaterally.  CV: RRR. No murmur, rub, or gallop. No JVD, no pedal edema. GI: Abdomen soft, very mild epigastric tenderness without masses palpated, non-distended, with normoactive bowel sounds. No organomegaly or masses felt. Ext: Warm, no deformities Skin: Diffuse pallor, otherwise no rashes or wounds or bruising. Neuro: Alert and oriented. No focal neurological deficits. Psych: Judgement and insight appear normal. Mood & affect appropriate.   Data Reviewed: I have personally reviewed following labs and imaging studies  CBC: Recent Labs  Lab 10/30/17 0921 10/30/17 1823 10/31/17 0511 10/31/17 1732 11/01/17 0439  WBC 8.6 8.0 7.2 10.2 7.8  NEUTROABS 7.0 6.5 5.6 8.3* 6.4  HGB 7.7* 7.6* 7.6* 8.5* 7.4*  HCT 24.0* 23.9* 23.9* 27.5* 23.1*  MCV 82.2 82.7 83.0 84.1 84.0  PLT 270 262 241 294 048   Basic Metabolic Panel: Recent Labs  Lab 10/29/17 1337 10/29/17 1818 10/30/17 0106 10/30/17 0921 10/31/17 0511 11/01/17 0439  NA 133*  --  135 138 138 139  K 5.1  --  5.0 4.5 4.2 3.9  CL 101  --  107 109 109 111  CO2 23  --  21* 21* 22 22  GLUCOSE 111*  --  104* 107* 104* 104*  BUN 36*  --  22* _0 CREATININE 1.17  --  0.94 0.77 0.74 0.75  CALCIUM 9.5  --  8.7* 8.8* 8.5* 8.3*  MG  --  2.0  --   --   --   --    GFR: Estimated Creatinine Clearance: 102.3 mL/min (by C-G formula based on SCr of 0.75 mg/dL). Liver Function Tests: Recent Labs  Lab 10/29/17 1151 10/30/17 0106  10/30/17 0921 10/31/17 0511 11/01/17 0439  AST 44* _1 ALT _2 15*  ALKPHOS 113 96 93 92 92  BILITOT 0.8 0.4 1.0 0.7 0.6  PROT 6.9 6.2* 6.2* 6.0* 5.7*  ALBUMIN 3.2* 2.8* 2.8* 2.6* 2.5*   Recent Labs  Lab 10/29/17 1337  LIPASE 45   No results for input(s): AMMONIA in the last 168 hours. Coagulation Profile: Recent Labs  Lab 10/30/17 0106  INR 1.09   Cardiac Enzymes: Recent Labs  Lab 10/29/17 1337  TROPONINI <0.03   BNP (last 3 results) No results for input(s): PROBNP in the last 8760 hours. HbA1C: Recent Labs    10/29/17 1818  HGBA1C 6.6*   CBG: Recent Labs  Lab 10/31/17 1141 10/31/17 1757 10/31/17 2139 11/01/17 0752 11/01/17 1142  GLUCAP 95 91 94 99  99   Lipid Profile: No results for input(s): CHOL, HDL, LDLCALC, TRIG, CHOLHDL, LDLDIRECT in the last 72 hours. Thyroid Function Tests: No results for input(s): TSH, T4TOTAL, FREET4, T3FREE, THYROIDAB in the last 72 hours. Anemia Panel: Recent Labs    10/29/17 1818  VITAMINB12 1,499*  FOLATE 17.0  FERRITIN 92  TIBC 382  IRON 19*  RETICCTPCT 2.4   Urine analysis:    Component Value Date/Time   COLORURINE STRAW (A) 10/30/2017 0434   APPEARANCEUR CLEAR 10/30/2017 0434   LABSPEC 1.012 10/30/2017 0434   PHURINE 5.0 10/30/2017 0434   GLUCOSEU NEGATIVE 10/30/2017 0434   HGBUR NEGATIVE 10/30/2017 0434   BILIRUBINUR NEGATIVE 10/30/2017 0434   KETONESUR 5 (A) 10/30/2017 [place] NEGATIVE 10/30/2017 0434   NITRITE NEGATIVE 10/30/2017 0434   LEUKOCYTESUR NEGATIVE 10/30/2017 0434   Recent Results (from the past 240 hour(s))  Culture, Urine     Status: None   Collection Time: 10/30/17  4:34 AM  Result Value Ref Range Status   Specimen Description URINE, CLEAN CATCH  Final   Special Requests NONE  Final   Culture   Final    NO GROWTH Performed at [facility], [place] [address], [place], [place] [address]    Report Status 10/31/2017 FINAL  Final      Radiology  Studies: Ir US Guide Bx Asp/drain  Result Date: 10/30/2017 INDICATION: Concern for metastatic pancreatic cancer. Please perform liver lesion biopsy for tissue diagnostic purposes. EXAM: ULTRASOUND GUIDED LIVER LESION BIOPSY COMPARISON:  CT abdomen and pelvis - 11/20/202=18 MEDICATIONS: None ANESTHESIA/SEDATION: Fentanyl 75 mcg IV; Versed 2 mg IV Total Moderate Sedation time: 11 minutes. The patient's level of consciousness and vital signs were monitored continuously by radiology nursing throughout the procedure under my direct supervision. COMPLICATIONS: None immediate. PROCEDURE: Informed written consent was obtained from the patient after a discussion of the risks, benefits and alternatives to treatment. The patient understands and consents the procedure. A timeout was performed prior to the initiation of the procedure. Ultrasound scanning was performed of the right upper abdominal quadrant demonstrates a large, at least 6.4 x 5.1 cm, lesion/mass within the right lobe liver correlating with the lesion seen on image 25, series 2 of preceding abdominal CT. The procedure was planned. The right upper abdominal quadrant was prepped and draped in the usual sterile fashion. The overlying soft tissues were anesthetized with 1% lidocaine with epinephrine. A 17 gauge, 6.8 cm co-axial needle was advanced into a peripheral aspect of the lesion. This was followed by 4 core biopsies with an 18 gauge core device under direct ultrasound guidance. The coaxial needle tract was embolized with a small amount of Gel-Foam slurry and superficial hemostasis was obtained with manual compression. Post procedural scanning was negative for definitive area of hemorrhage or additional complication. A dressing was placed. The patient tolerated the procedure well without immediate post procedural complication. IMPRESSION: Technically successful ultrasound guided core needle biopsy of indeterminate mass within the caudal aspect of the right  lobe of the liver. Electronically Signed   By: Sandi Mariscal M.D.   On: 10/30/2017 17:41    Scheduled Meds: . insulin aspart  0-5 Units Subcutaneous QHS  . insulin aspart  0-9 Units Subcutaneous TID WC  . multivitamin with minerals  1 tablet Oral Daily  . [START ON 11/02/2017] pantoprazole  40 mg Intravenous Q12H   Continuous Infusions: . sodium chloride 125 mL/hr at 11/01/17 1400  . pantoprozole (PROTONIX) infusion 8 mg/hr (11/01/17 1336)  LOS: 3 days   Time spent: 25 minutes.  Vance Gather, MD [facility] Pager [phone]  If 7PM-7AM, please contact night-coverage [email or web address] Password [facility] 11/01/2017, 3:37 PM

## 2017-11-01 NOTE — Progress Notes (Signed)
OT Cancellation Note  Patient Details Name: [name] MRN: [id number] DOB: 08-24-1961   Cancelled Treatment:    Reason Eval/Treat Not Completed: OT screened, no needs identified, will sign off  Niki Payment 11/01/2017, 9:11 AM  Lesle Chris, OTR/L [phone] 11/01/2017

## 2017-11-02 LAB — GLUCOSE, CAPILLARY
GLUCOSE-CAPILLARY: 106 mg/dL — AB (ref 65–99)
GLUCOSE-CAPILLARY: 108 mg/dL — AB (ref 65–99)
GLUCOSE-CAPILLARY: 163 mg/dL — AB (ref 65–99)
Glucose-Capillary: 108 mg/dL — ABNORMAL HIGH (ref 65–99)

## 2017-11-02 LAB — CBC WITH DIFFERENTIAL/PLATELET
BASOS PCT: 0 %
Basophils Absolute: 0 10*3/uL (ref 0.0–0.1)
EOS ABS: 0.2 10*3/uL (ref 0.0–0.7)
EOS PCT: 3 %
HCT: 23.4 % — ABNORMAL LOW (ref 39.0–52.0)
Hemoglobin: 7.4 g/dL — ABNORMAL LOW (ref 13.0–17.0)
Lymphocytes Relative: 11 %
Lymphs Abs: 0.9 10*3/uL (ref 0.7–4.0)
MCH: 26.6 pg (ref 26.0–34.0)
MCHC: 31.6 g/dL (ref 30.0–36.0)
MCV: 84.2 fL (ref 78.0–100.0)
MONO ABS: 0.5 10*3/uL (ref 0.1–1.0)
MONOS PCT: 7 %
NEUTROS PCT: 79 %
Neutro Abs: 6.5 10*3/uL (ref 1.7–7.7)
PLATELETS: 264 10*3/uL (ref 150–400)
RBC: 2.78 MIL/uL — ABNORMAL LOW (ref 4.22–5.81)
RDW: 15.1 % (ref 11.5–15.5)
WBC: 8.2 10*3/uL (ref 4.0–10.5)

## 2017-11-02 LAB — COMPREHENSIVE METABOLIC PANEL
ALK PHOS: 111 U/L (ref 38–126)
ALT: 20 U/L (ref 17–63)
AST: 30 U/L (ref 15–41)
Albumin: 2.5 g/dL — ABNORMAL LOW (ref 3.5–5.0)
Anion gap: 7 (ref 5–15)
BUN: 6 mg/dL (ref 6–20)
CALCIUM: 8.3 mg/dL — AB (ref 8.9–10.3)
CO2: 23 mmol/L (ref 22–32)
CREATININE: 0.64 mg/dL (ref 0.61–1.24)
Chloride: 110 mmol/L (ref 101–111)
GFR calc non Af Amer: >60 mL/min (ref >60)
GLUCOSE: 114 mg/dL — AB (ref 65–99)
Potassium: 3.5 mmol/L (ref 3.5–5.1)
SODIUM: 140 mmol/L (ref 135–145)
Total Bilirubin: 0.6 mg/dL (ref 0.3–1.2)
Total Protein: 5.6 g/dL — ABNORMAL LOW (ref 6.5–8.1)

## 2017-11-02 NOTE — Progress Notes (Signed)
PROGRESS NOTE  [name]  XBM:[id number] DOB: [birth year] DOA: 10/29/2017 PCP: [name], MD   Brief Narrative: [name] is a 56 y.o. male with medical history significant of hypertension, diabetes, family history of ovarian and prostate cancer, family history positive for BRCA2 gene, hyperlipidemia who presents to the ED with a 1-1/2-week history of upper abdominal pain occasionally radiating to his back, dizziness, lightheadedness, black tarry stools, generalized weakness.  Patient also endorses a 20 pound weight loss over the past 2 months with a decreased appetite. Patient had presented to his PCPs office or lab work which was obtained showed a new anemia with a hemoglobin of 8.1. He was sent to the ED where labs showed a white count of 13.5, hemoglobin of 7.8, trending down to 7.0, platelets of 356 otherwise was within normal limits.  Basic metabolic profile obtained had a sodium of 133 glucose of 111 BUN of 36 creatinine of 1.17. CT abdomen and pelvis demonstrated a pancreatic mass and multiple hepatic lesions. 1u PRBCs given with improvement to hgb 7.7g/dl. EGD 11/21 demonstrated a necrotic duodenal mass with adherent clot and bleeding and erosive esophagitis. IR was consulted for biopsy of hepatic lesion, performed 11/21. Clear liquids were started, and pt has had a single melanotic stool on 11/22.   Assessment & Plan: Principal Problem:   GI bleed Active Problems:   Diabetes type 2, controlled ([place])   Hypertension   Symptomatic anemia   Acute blood loss anemia   Pancreatic mass: Per CT abd/pelvis 10/29/2017   Dehydration   Leukocytosis   Metastases to the liver [facility])   Stage IV adenocarcinoma of pancreas ([facility])  Acute GI bleed with acute blood loss anemia: Due to necrotic duodenal mass noted on EGD.  - Continue PPI, tolerating advanced diet per GI (signed off 11/23) - Monitor clinically for bleeding, tranfuse for hgb < 7g/dl. Still having some darker stools.  Recheck CBC in AM - Surgery consulted for consideration of palliative resection, not likely indicated, but remain consulted for port placement.  Pancreatic mass eroding into duodenum with likely metastatic lesion on liver: Initial pathology consistent with adenocarcinoma, likely pancreatic metastatic to liver. CA 19-9 grossly elevated at 6,905.  - Dr. Lebron Conners offering port placement and palliative systemic chemotherapy with BRCA-targeting agent.   Dehydration: resolved  T2DM: Well-controlled with HbA1c 6.6%.    - Hold oral hypoglycemic agents.  - SSI  DVT prophylaxis: SCDs Code Status: Full Family Communication: Sister and broth-in-law at bedside this AM Disposition Plan: Anticipate DC home after port inserted, starting chemotherapy.  Consultants:   [facility]  IR  Surgery  Oncology  Procedures:   10/30/2017: Technically successful US guided biopsy of indeterminate mass within the right lobe of the liver by Dr. Pascal Lux.   EGD 10/30/2017 by Dr. Silverio Decamp: - LA Grade C (one or more mucosal breaks continuous between tops of 2 or more mucosal folds, less than 75% circumference) esophagitis with no bleeding was found 34 to 38 cm from the incisors. - The stomach was normal. - A large infiltrative and ulcerated mass with bleeding, large adherent clot, involving >50 % luminal surface area 5-7cm in length was found in the second portion of the duodenum. Biopsies were taken with a coldforceps for histology. Not amenable to endoscopic intervention to control the bleeding.  Antimicrobials:  None  Subjective: Still having darker-than-usual BMs more than daily, no abd pain, tolerating diet. Wants to have port placed and start therapy. Denies lightheadedness on standing.  Objective:  Vitals:   11/01/17 1515 11/01/17 2243 11/02/17 0524 11/02/17 1339  BP: 114/63 106/66 (!) 112/56 (!) 119/58  Pulse: 96 92 88 95  Resp: 18 16 18 18   Temp: 98.6 F (37 C) 98.6 F (37 C) 98.5 F (36.9 C)  99.3 F (37.4 C)  TempSrc: Oral Oral Oral Oral  SpO2: 98% 100% 97% 97%  Weight:   78.7 kg (173 lb 8 oz)   Height:        Intake/Output Summary (Last 24 hours) at 11/02/2017 1635 Last data filed at 11/02/2017 0600 Gross per 24 hour  Intake 2459.17 ml  Output -  Net 2459.17 ml   Filed Weights   10/31/17 0525 11/01/17 0623 11/02/17 0524  Weight: 77.6 kg (171 lb 1.2 oz) 77.6 kg (171 lb 1.2 oz) 78.7 kg (173 lb 8 oz)    Gen: Pale, pleasant male in no distress  Pulm: Non-labored breathing room air. Clear to auscultation bilaterally.  CV: RRR. No murmur, rub, or gallop. No JVD, no pedal edema. GI: Abdomen soft, very mild epigastric tenderness without masses palpated, non-distended, with normoactive bowel sounds. No organomegaly or masses felt. Ext: Warm, no deformities Skin: Diffuse pallor, otherwise no rashes or wounds or bruising. Neuro: Alert and oriented. No focal neurological deficits. Psych: Judgement and insight appear normal. Mood & affect appropriate.   Data Reviewed: I have personally reviewed following labs and imaging studies  CBC: Recent Labs  Lab 10/30/17 1823 10/31/17 0511 10/31/17 1732 11/01/17 0439 11/02/17 0459  WBC 8.0 7.2 10.2 7.8 8.2  NEUTROABS 6.5 5.6 8.3* 6.4 6.5  HGB 7.6* 7.6* 8.5* 7.4* 7.4*  HCT 23.9* 23.9* 27.5* 23.1* 23.4*  MCV 82.7 83.0 84.1 84.0 84.2  PLT 262 241 294 239 335   Basic Metabolic Panel: Recent Labs  Lab 10/29/17 1818 10/30/17 0106 10/30/17 0921 10/31/17 0511 11/01/17 0439 11/02/17 0459  NA  --  135 138 138 139 140  K  --  5.0 4.5 4.2 3.9 3.5  CL  --  107 109 109 111 110  CO2  --  21* 21* 22 22 23   GLUCOSE  --  104* 107* 104* 104* 114*  BUN  --  22* 17 10 6 6   CREATININE  --  0.94 0.77 0.74 0.75 0.64  CALCIUM  --  8.7* 8.8* 8.5* 8.3* 8.3*  MG 2.0  --   --   --   --   --    GFR: Estimated Creatinine Clearance: 103 mL/min (by C-G formula based on SCr of 0.64 mg/dL). Liver Function Tests: Recent Labs  Lab  10/30/17 0106 10/30/17 0921 10/31/17 0511 11/01/17 0439 11/02/17 0459  AST 24 21 22 23 30   ALT 20 18 18  15* 20  ALKPHOS 96 93 92 92 111  BILITOT 0.4 1.0 0.7 0.6 0.6  PROT 6.2* 6.2* 6.0* 5.7* 5.6*  ALBUMIN 2.8* 2.8* 2.6* 2.5* 2.5*   Recent Labs  Lab 10/29/17 1337  LIPASE 45   No results for input(s): AMMONIA in the last 168 hours. Coagulation Profile: Recent Labs  Lab 10/30/17 0106  INR 1.09   Cardiac Enzymes: Recent Labs  Lab 10/29/17 1337  TROPONINI <0.03   BNP (last 3 results) No results for input(s): PROBNP in the last 8760 hours. HbA1C: No results for input(s): HGBA1C in the last 72 hours. CBG: Recent Labs  Lab 11/01/17 1142 11/01/17 1708 11/01/17 2243 11/02/17 0724 11/02/17 1132  GLUCAP 99 106* 128* 106* 108*   Lipid Profile: No results for input(s): CHOL,  HDL, LDLCALC, TRIG, CHOLHDL, LDLDIRECT in the last 72 hours. Thyroid Function Tests: No results for input(s): TSH, T4TOTAL, FREET4, T3FREE, THYROIDAB in the last 72 hours. Anemia Panel: No results for input(s): VITAMINB12, FOLATE, FERRITIN, TIBC, IRON, RETICCTPCT in the last 72 hours. Urine analysis:    Component Value Date/Time   COLORURINE STRAW (A) 10/30/2017 [place] 10/30/2017 0434   LABSPEC 1.012 10/30/2017 0434   PHURINE 5.0 10/30/2017 [place] 10/30/2017 0434   HGBUR NEGATIVE 10/30/2017 [place] 10/30/2017 0434   KETONESUR 5 (A) 10/30/2017 [place] NEGATIVE 10/30/2017 0434   NITRITE NEGATIVE 10/30/2017 0434   LEUKOCYTESUR NEGATIVE 10/30/2017 0434   Recent Results (from the past 240 hour(s))  Culture, Urine     Status: None   Collection Time: 10/30/17  4:34 AM  Result Value Ref Range Status   Specimen Description URINE, CLEAN CATCH  Final   Special Requests NONE  Final   Culture   Final    NO GROWTH Performed at [facility], [place] [address], [place], [place] [address]    Report Status 10/31/2017 FINAL  Final       Radiology Studies: No results found.  Scheduled Meds: . insulin aspart  0-5 Units Subcutaneous QHS  . insulin aspart  0-9 Units Subcutaneous TID WC  . multivitamin with minerals  1 tablet Oral Daily  . pantoprazole  40 mg Intravenous Q12H   Continuous Infusions:    LOS: 4 days   Time spent: 25 minutes.  Vance Gather, MD [facility] Pager [phone]  If 7PM-7AM, please contact night-coverage [email or web address] Password [facility] 11/02/2017, 4:35 PM

## 2017-11-03 LAB — GLUCOSE, CAPILLARY
GLUCOSE-CAPILLARY: 121 mg/dL — AB (ref 65–99)
GLUCOSE-CAPILLARY: 117 mg/dL — AB (ref 65–99)
Glucose-Capillary: 131 mg/dL — ABNORMAL HIGH (ref 65–99)
Glucose-Capillary: 103 mg/dL — ABNORMAL HIGH (ref 65–99)

## 2017-11-03 LAB — CBC WITH DIFFERENTIAL/PLATELET
Basophils Absolute: 0 10*3/uL (ref 0.0–0.1)
Basophils Relative: 0 %
EOS ABS: 0.2 10*3/uL (ref 0.0–0.7)
EOS PCT: 3 %
HCT: 22.2 % — ABNORMAL LOW (ref 39.0–52.0)
Hemoglobin: 7.1 g/dL — ABNORMAL LOW (ref 13.0–17.0)
LYMPHS ABS: 0.9 10*3/uL (ref 0.7–4.0)
LYMPHS PCT: 12 %
MCH: 26.8 pg (ref 26.0–34.0)
MCHC: 32 g/dL (ref 30.0–36.0)
MCV: 83.8 fL (ref 78.0–100.0)
MONO ABS: 0.5 10*3/uL (ref 0.1–1.0)
MONOS PCT: 6 %
Neutro Abs: 6.1 10*3/uL (ref 1.7–7.7)
Neutrophils Relative %: 79 %
PLATELETS: 262 10*3/uL (ref 150–400)
RBC: 2.65 MIL/uL — AB (ref 4.22–5.81)
RDW: 15.2 % (ref 11.5–15.5)
WBC: 7.6 10*3/uL (ref 4.0–10.5)

## 2017-11-03 LAB — COMPREHENSIVE METABOLIC PANEL
ALBUMIN: 2.4 g/dL — AB (ref 3.5–5.0)
ALT: 23 U/L (ref 17–63)
AST: 29 U/L (ref 15–41)
Alkaline Phosphatase: 102 U/L (ref 38–126)
Anion gap: 6 (ref 5–15)
BUN: 8 mg/dL (ref 6–20)
CHLORIDE: 109 mmol/L (ref 101–111)
CO2: 23 mmol/L (ref 22–32)
CREATININE: 0.63 mg/dL (ref 0.61–1.24)
Calcium: 8.1 mg/dL — ABNORMAL LOW (ref 8.9–10.3)
GFR calc Af Amer: >60 mL/min (ref >60)
GFR calc non Af Amer: >60 mL/min (ref >60)
GLUCOSE: 128 mg/dL — AB (ref 65–99)
POTASSIUM: 3.4 mmol/L — AB (ref 3.5–5.1)
SODIUM: 138 mmol/L (ref 135–145)
Total Bilirubin: 0.7 mg/dL (ref 0.3–1.2)
Total Protein: 5.4 g/dL — ABNORMAL LOW (ref 6.5–8.1)

## 2017-11-03 LAB — PREPARE RBC (CROSSMATCH)

## 2017-11-03 MED ORDER — SODIUM CHLORIDE 0.9 % IV SOLN
Freq: Once | INTRAVENOUS | Status: AC
  Administered 2017-11-03: 14:00:00 via INTRAVENOUS

## 2017-11-03 NOTE — Progress Notes (Signed)
PROGRESS NOTE  [name]  JQG:[id number] DOB: 03-13-1961 DOA: 10/29/2017 PCP: [name], MD   Brief Narrative: [name] is a 56 y.o. male with medical history significant of hypertension, diabetes, family history of ovarian and prostate cancer, family history positive for BRCA2 gene, hyperlipidemia who presents to the ED with a 1-1/2-week history of upper abdominal pain occasionally radiating to his back, dizziness, lightheadedness, black tarry stools, generalized weakness.  Patient also endorses a 20 pound weight loss over the past 2 months with a decreased appetite. Patient had presented to his PCPs office or lab work which was obtained showed a new anemia with a hemoglobin of 8.1. He was sent to the ED where labs showed a white count of 13.5, hemoglobin of 7.8, trending down to 7.0, platelets of 356 otherwise was within normal limits.  Basic metabolic profile obtained had a sodium of 133 glucose of 111 BUN of 36 creatinine of 1.17. CT abdomen and pelvis demonstrated a pancreatic mass and multiple hepatic lesions. 1u PRBCs given with improvement to hgb 7.7g/dl. EGD 11/21 demonstrated a necrotic duodenal mass with adherent clot and bleeding and erosive esophagitis. IR was consulted for biopsy of hepatic lesion, performed 11/21. Clear liquids were started, and pt has had a single melanotic stool on 11/22. Diet was successfully advanced with resolution of gross melena. Hemoglobin trended downward prompting 1u PRBC transfusion 11/25. Post placement is planned.  Assessment & Plan: Principal Problem:   GI bleed Active Problems:   Diabetes type 2, controlled ([place])   Hypertension   Symptomatic anemia   Acute blood loss anemia   Pancreatic mass: Per CT abd/pelvis 10/29/2017   Dehydration   Leukocytosis   Metastases to the liver [facility])   Stage IV adenocarcinoma of pancreas (HCC)  Acute GI bleed with acute blood loss anemia: Due to necrotic duodenal mass noted on EGD.  -  Continue PPI, tolerating advanced diet per GI (signed off 11/23) - Hgb trending slowly downward to 7.1g/dl 11/25 with surgery planned. Will transfuse 1u PRBCs today and recheck CBC in AM.   - Surgery consulted for consideration of palliative resection, not likely indicated, but remain consulted for port placement.  Pancreatic mass eroding into duodenum with likely metastatic lesion on liver: Initial pathology consistent with adenocarcinoma, likely pancreatic metastatic to liver. CA 19-9 grossly elevated at 6,905.  - Dr. Lebron Conners has seen patient, plan to start palliative systemic chemotherapy with BRCA-targeting agent.  - Surgery planning port placement 11/26 tentatively. Made NPO p MN.   Dehydration: resolved.  - DC IVF  T2DM: Well-controlled with HbA1c 6.6%.    - Holding oral hypoglycemic agents.  - SSI  DVT prophylaxis: SCDs Code Status: Full Family Communication: None at bedside this AM Disposition Plan: [place] home after port inserted, timing of chemotherapy per oncology.  Consultants:   [facility]  IR  Surgery  Oncology, Dr. Lebron Conners  Procedures:   10/30/2017: Technically successful US guided biopsy of indeterminate mass within the right lobe of the liver by Dr. Pascal Lux.   EGD 10/30/2017 by Dr. Silverio Decamp: - LA Grade C (one or more mucosal breaks continuous between tops of 2 or more mucosal folds, less than 75% circumference) esophagitis with no bleeding was found 34 to 38 cm from the incisors. - The stomach was normal. - A large infiltrative and ulcerated mass with bleeding, large adherent clot, involving >50 % luminal surface area 5-7cm in length was found in the second portion of the duodenum. Biopsies were taken with  a coldforceps for histology. Not amenable to endoscopic intervention to control the bleeding.  Antimicrobials:  None  Subjective: No complaints today. Denies bleeding, stools are normal appearing. Tolerating a diet. Denies chest pain, dyspnea,  palpitations.  Objective: Vitals:   11/03/17 0500 11/03/17 0547 11/03/17 1341 11/03/17 1420  BP:  (!) 103/58 (!) 108/54 (!) 104/57  Pulse:  87 94 90  Resp:  18 14 12   Temp:  99.7 F (37.6 C) 99.9 F (37.7 C) 99.1 F (37.3 C)  TempSrc:  Oral Oral Oral  SpO2:  98% 97% 97%  Weight: 78.6 kg (173 lb 4.5 oz)     Height:        Intake/Output Summary (Last 24 hours) at 11/03/2017 1422 Last data filed at 11/03/2017 1000 Gross per 24 hour  Intake 1440 ml  Output 300 ml  Net 1140 ml   Filed Weights   11/01/17 0623 11/02/17 0524 11/03/17 0500  Weight: 77.6 kg (171 lb 1.2 oz) 78.7 kg (173 lb 8 oz) 78.6 kg (173 lb 4.5 oz)    Gen: Pleasant 56yo male in no distress  Pulm: Non-labored breathing room air. Clear to auscultation bilaterally.  CV: RRR. No murmur, rub, or gallop. No JVD, no pedal edema. GI: Abdomen soft, no appreciable tenderness, no masses, non-distended, with normoactive bowel sounds. Ext: Warm, no deformities Skin: Diffuse pallor, otherwise no rashes or wounds or bruising. Neuro: Alert and oriented. No focal neurological deficits. Psych: Judgement and insight appear normal. Mood & affect appropriate.   Data Reviewed: I have personally reviewed following labs and imaging studies  CBC: Recent Labs  Lab 10/31/17 0511 10/31/17 1732 11/01/17 0439 11/02/17 0459 11/03/17 0610  WBC 7.2 10.2 7.8 8.2 7.6  NEUTROABS 5.6 8.3* 6.4 6.5 6.1  HGB 7.6* 8.5* 7.4* 7.4* 7.1*  HCT 23.9* 27.5* 23.1* 23.4* 22.2*  MCV 83.0 84.1 84.0 84.2 83.8  PLT 241 294 239 264 903   Basic Metabolic Panel: Recent Labs  Lab 10/29/17 1818  10/30/17 0921 10/31/17 0511 11/01/17 0439 11/02/17 0459 11/03/17 0610  NA  --    < > 138 138 139 140 138  K  --    < > 4.5 4.2 3.9 3.5 3.4*  CL  --    < > 109 109 111 110 109  CO2  --    < > 21* 22 22 23 23   GLUCOSE  --    < > 107* 104* 104* 114* 128*  BUN  --    < > 17 10 6 6 8   CREATININE  --    < > 0.77 0.74 0.75 0.64 0.63  CALCIUM  --    < > 8.8*  8.5* 8.3* 8.3* 8.1*  MG 2.0  --   --   --   --   --   --    < > = values in this interval not displayed.   GFR: Estimated Creatinine Clearance: 102.9 mL/min (by C-G formula based on SCr of 0.63 mg/dL). Liver Function Tests: Recent Labs  Lab 10/30/17 0921 10/31/17 0511 11/01/17 0439 11/02/17 0459 11/03/17 0610  AST 21 22 23 30 29   ALT 18 18 15* 20 23  ALKPHOS 93 92 92 111 102  BILITOT 1.0 0.7 0.6 0.6 0.7  PROT 6.2* 6.0* 5.7* 5.6* 5.4*  ALBUMIN 2.8* 2.6* 2.5* 2.5* 2.4*   Recent Labs  Lab 10/29/17 1337  LIPASE 45   No results for input(s): AMMONIA in the last 168 hours. Coagulation Profile: Recent Labs  Lab 10/30/17 0106  INR 1.09   Cardiac Enzymes: Recent Labs  Lab 10/29/17 1337  TROPONINI <0.03   CBG: Recent Labs  Lab 11/02/17 1132 11/02/17 1635 11/02/17 2102 11/03/17 0730 11/03/17 1130  GLUCAP 108* 108* 163* 121* 131*   Urine analysis:    Component Value Date/Time   COLORURINE STRAW (A) 10/30/2017 [place] 10/30/2017 0434   LABSPEC 1.012 10/30/2017 0434   PHURINE 5.0 10/30/2017 [place] 10/30/2017 0434   HGBUR NEGATIVE 10/30/2017 0434   [place] 10/30/2017 0434   KETONESUR 5 (A) 10/30/2017 0434   PROTEINUR NEGATIVE 10/30/2017 0434   NITRITE NEGATIVE 10/30/2017 0434   LEUKOCYTESUR NEGATIVE 10/30/2017 0434   Recent Results (from the past 240 hour(s))  Culture, Urine     Status: None   Collection Time: 10/30/17  4:34 AM  Result Value Ref Range Status   Specimen Description URINE, CLEAN CATCH  Final   Special Requests NONE  Final   Culture   Final    NO GROWTH Performed at [facility], [place] [address] [place], [place] [address]    Report Status 10/31/2017 FINAL  Final      Radiology Studies: No results found.  Scheduled Meds: . insulin aspart  0-5 Units Subcutaneous QHS  . insulin aspart  0-9 Units Subcutaneous TID WC  . multivitamin with minerals  1 tablet Oral Daily  . pantoprazole   40 mg Intravenous Q12H   Continuous Infusions:    LOS: 5 days   Time spent: 25 minutes.  Vance Gather, MD [facility] Pager [phone]  If 7PM-7AM, please contact night-coverage [email or web address] Password [email or web address] 11/03/2017, 2:22 PM

## 2017-11-03 NOTE — Progress Notes (Signed)
Patient ID: [name], male   DOB: 04/29/1961, 56 y.o.   MRN: [id number] Patient without complaints this morning.  I discussed Port-A-Cath placement with the patient.  We discussed use of general anesthesia in nature and indications of the procedure.  Discussed risks of bleeding, infection, vascular injury, pneumothorax and long-term risks of catheter displacement or occlusion and small risk of DVT.  All his questions were answered.  He agrees to proceed.  We will tentatively schedule for Dr. Kieth Brightly to place tomorrow depending on the emergency schedule.

## 2017-11-04 ENCOUNTER — Inpatient Hospital Stay (HOSPITAL_COMMUNITY): Payer: BLUE CROSS/BLUE SHIELD | Admitting: Anesthesiology

## 2017-11-04 ENCOUNTER — Inpatient Hospital Stay (HOSPITAL_COMMUNITY): Payer: BLUE CROSS/BLUE SHIELD

## 2017-11-04 ENCOUNTER — Telehealth: Payer: Self-pay

## 2017-11-04 ENCOUNTER — Ambulatory Visit
Admit: 2017-11-04 | Discharge: 2017-11-04 | Disposition: A | Discharge status: Home / Self Care | Payer: BLUE CROSS/BLUE SHIELD | Attending: Radiation Oncology | Admitting: Radiation Oncology

## 2017-11-04 ENCOUNTER — Ambulatory Visit
Admission: RE | Admit: 2017-11-04 | Discharge: 2017-11-04 | Disposition: A | Discharge status: Home / Self Care | Payer: BLUE CROSS/BLUE SHIELD | Source: Ambulatory Visit | Attending: Radiation Oncology | Admitting: Radiation Oncology

## 2017-11-04 ENCOUNTER — Encounter (HOSPITAL_COMMUNITY)
Admission: EM | Disposition: A | Discharge status: Home / Self Care | Payer: Self-pay | Source: Home / Self Care | Attending: Family Medicine

## 2017-11-04 ENCOUNTER — Encounter (HOSPITAL_COMMUNITY): Payer: Self-pay | Admitting: Radiology

## 2017-11-04 DIAGNOSIS — C259 Malignant neoplasm of pancreas, unspecified: Secondary | ICD-10-CM | POA: Insufficient documentation

## 2017-11-04 DIAGNOSIS — Z510 Encounter for antineoplastic radiation therapy: Secondary | ICD-10-CM | POA: Insufficient documentation

## 2017-11-04 DIAGNOSIS — K264 Chronic or unspecified duodenal ulcer with hemorrhage: Secondary | ICD-10-CM

## 2017-11-04 DIAGNOSIS — C250 Malignant neoplasm of head of pancreas: Secondary | ICD-10-CM

## 2017-11-04 DIAGNOSIS — C779 Secondary and unspecified malignant neoplasm of lymph node, unspecified: Secondary | ICD-10-CM

## 2017-11-04 HISTORY — PX: PORTACATH PLACEMENT: SHX2246

## 2017-11-04 LAB — TYPE AND SCREEN
ABO/RH(D): O POS
Antibody Screen: NEGATIVE
UNIT DIVISION: 0

## 2017-11-04 LAB — BASIC METABOLIC PANEL
ANION GAP: 5 (ref 5–15)
BUN: 11 mg/dL (ref 6–20)
CHLORIDE: 107 mmol/L (ref 101–111)
CO2: 27 mmol/L (ref 22–32)
Calcium: 8.3 mg/dL — ABNORMAL LOW (ref 8.9–10.3)
Creatinine, Ser: 0.67 mg/dL (ref 0.61–1.24)
GFR calc Af Amer: >60 mL/min (ref >60)
GLUCOSE: 120 mg/dL — AB (ref 65–99)
POTASSIUM: 3.4 mmol/L — AB (ref 3.5–5.1)
SODIUM: 139 mmol/L (ref 135–145)

## 2017-11-04 LAB — BPAM RBC
BLOOD PRODUCT EXPIRATION DATE: 201812112359
ISSUE DATE / TIME: 201811251354
UNIT TYPE AND RH: 5100

## 2017-11-04 LAB — CBC WITH DIFFERENTIAL/PLATELET
BASOS ABS: 0 10*3/uL (ref 0.0–0.1)
Basophils Relative: 0 %
EOS PCT: 3 %
Eosinophils Absolute: 0.3 10*3/uL (ref 0.0–0.7)
HCT: 26.3 % — ABNORMAL LOW (ref 39.0–52.0)
HEMOGLOBIN: 8.3 g/dL — AB (ref 13.0–17.0)
LYMPHS PCT: 10 %
Lymphs Abs: 0.9 10*3/uL (ref 0.7–4.0)
MCH: 26.3 pg (ref 26.0–34.0)
MCHC: 31.6 g/dL (ref 30.0–36.0)
MCV: 83.5 fL (ref 78.0–100.0)
Monocytes Absolute: 0.6 10*3/uL (ref 0.1–1.0)
Monocytes Relative: 6 %
NEUTROS PCT: 81 %
Neutro Abs: 7.4 10*3/uL (ref 1.7–7.7)
PLATELETS: 270 10*3/uL (ref 150–400)
RBC: 3.15 MIL/uL — AB (ref 4.22–5.81)
RDW: 15 % (ref 11.5–15.5)
WBC: 9.1 10*3/uL (ref 4.0–10.5)

## 2017-11-04 LAB — GLUCOSE, CAPILLARY
GLUCOSE-CAPILLARY: 117 mg/dL — AB (ref 65–99)
Glucose-Capillary: 115 mg/dL — ABNORMAL HIGH (ref 65–99)
Glucose-Capillary: 136 mg/dL — ABNORMAL HIGH (ref 65–99)
Glucose-Capillary: 159 mg/dL — ABNORMAL HIGH (ref 65–99)

## 2017-11-04 SURGERY — INSERTION, TUNNELED CENTRAL VENOUS DEVICE, WITH PORT
Anesthesia: General

## 2017-11-04 MED ORDER — CEFAZOLIN SODIUM-DEXTROSE 2-4 GM/100ML-% IV SOLN
2.0000 g | INTRAVENOUS | Status: AC
  Administered 2017-11-04: 2 g via INTRAVENOUS

## 2017-11-04 MED ORDER — PROMETHAZINE HCL 25 MG/ML IJ SOLN: 6.2500 mg | INTRAMUSCULAR | Status: DC | PRN

## 2017-11-04 MED ORDER — HEPARIN SOD (PORK) LOCK FLUSH 100 UNIT/ML IV SOLN
INTRAVENOUS | Status: AC
  Filled 2017-11-04: qty 5

## 2017-11-04 MED ORDER — IOPAMIDOL (ISOVUE-300) INJECTION 61%
75.0000 mL | Freq: Once | INTRAVENOUS | Status: AC | PRN
  Administered 2017-11-04: 75 mL via INTRAVENOUS

## 2017-11-04 MED ORDER — ONDANSETRON HCL 4 MG/2ML IJ SOLN
INTRAMUSCULAR | Status: AC
  Filled 2017-11-04: qty 2

## 2017-11-04 MED ORDER — BUPIVACAINE-EPINEPHRINE (PF) 0.25% -1:200000 IJ SOLN
INTRAMUSCULAR | Status: AC
  Filled 2017-11-04: qty 30

## 2017-11-04 MED ORDER — SODIUM CHLORIDE 0.9 % IV SOLN
Freq: Once | INTRAVENOUS | Status: DC
  Filled 2017-11-04: qty 1.2

## 2017-11-04 MED ORDER — DEXAMETHASONE SODIUM PHOSPHATE 10 MG/ML IJ SOLN
INTRAMUSCULAR | Status: AC
  Filled 2017-11-04: qty 1

## 2017-11-04 MED ORDER — HEPARIN SOD (PORK) LOCK FLUSH 100 UNIT/ML IV SOLN
INTRAVENOUS | Status: DC | PRN
  Administered 2017-11-04: 500 [IU] via INTRAVENOUS

## 2017-11-04 MED ORDER — HEPARIN SODIUM (PORCINE) 5000 UNIT/ML IJ SOLN
INTRAMUSCULAR | Status: DC | PRN
  Administered 2017-11-04: 12:00:00

## 2017-11-04 MED ORDER — IOPAMIDOL (ISOVUE-300) INJECTION 61%
INTRAVENOUS | Status: AC
  Filled 2017-11-04: qty 75

## 2017-11-04 MED ORDER — LIDOCAINE 2% (20 MG/ML) 5 ML SYRINGE
INTRAMUSCULAR | Status: DC | PRN
  Administered 2017-11-04: 100 mg via INTRAVENOUS

## 2017-11-04 MED ORDER — DEXAMETHASONE SODIUM PHOSPHATE 10 MG/ML IJ SOLN
INTRAMUSCULAR | Status: DC | PRN
  Administered 2017-11-04: 10 mg via INTRAVENOUS

## 2017-11-04 MED ORDER — CEFAZOLIN SODIUM-DEXTROSE 2-4 GM/100ML-% IV SOLN
INTRAVENOUS | Status: AC
  Filled 2017-11-04: qty 100

## 2017-11-04 MED ORDER — BUPIVACAINE-EPINEPHRINE 0.25% -1:200000 IJ SOLN
INTRAMUSCULAR | Status: DC | PRN
  Administered 2017-11-04: 10 mL

## 2017-11-04 MED ORDER — FENTANYL CITRATE (PF) 100 MCG/2ML IJ SOLN
INTRAMUSCULAR | Status: AC
  Filled 2017-11-04: qty 2

## 2017-11-04 MED ORDER — SODIUM CHLORIDE 0.9% FLUSH
10.0000 mL | INTRAVENOUS | Status: DC | PRN
Start: 2017-11-04 — End: 2017-11-06
  Administered 2017-11-04 – 2017-11-05 (×2): 10 mL
  Filled 2017-11-04 (×2): qty 40

## 2017-11-04 MED ORDER — LIDOCAINE 2% (20 MG/ML) 5 ML SYRINGE
INTRAMUSCULAR | Status: AC
Start: 2017-11-04 — End: ?
  Filled 2017-11-04: qty 5

## 2017-11-04 MED ORDER — PROPOFOL 10 MG/ML IV BOLUS
INTRAVENOUS | Status: AC
  Filled 2017-11-04: qty 20

## 2017-11-04 MED ORDER — PROPOFOL 10 MG/ML IV BOLUS
INTRAVENOUS | Status: DC | PRN
  Administered 2017-11-04: 150 mg via INTRAVENOUS

## 2017-11-04 MED ORDER — LACTATED RINGERS IV SOLN
INTRAVENOUS | Status: DC
  Administered 2017-11-04: 1000 mL via INTRAVENOUS

## 2017-11-04 MED ORDER — ONDANSETRON HCL 4 MG/2ML IJ SOLN
INTRAMUSCULAR | Status: DC | PRN
  Administered 2017-11-04: 4 mg via INTRAVENOUS

## 2017-11-04 MED ORDER — FENTANYL CITRATE (PF) 100 MCG/2ML IJ SOLN
INTRAMUSCULAR | Status: DC | PRN
Start: 2017-11-04 — End: 2017-11-04
  Administered 2017-11-04 (×2): 50 ug via INTRAVENOUS

## 2017-11-04 MED ORDER — HYDROMORPHONE HCL 1 MG/ML IJ SOLN: 0.2500 mg | INTRAMUSCULAR | Status: DC | PRN

## 2017-11-04 MED ORDER — HYDROCODONE-ACETAMINOPHEN 5-325 MG PO TABS: 1.0000 | ORAL_TABLET | ORAL | Status: DC | PRN

## 2017-11-04 SURGICAL SUPPLY — 38 items
ADH SKN CLS APL DERMABOND .7 (GAUZE/BANDAGES/DRESSINGS) ×1
APL SKNCLS STERI-STRIP NONHPOA (GAUZE/BANDAGES/DRESSINGS) ×1
BAG DECANTER FOR FLEXI CONT (MISCELLANEOUS) ×3 IMPLANT
BENZOIN TINCTURE PRP APPL 2/3 (GAUZE/BANDAGES/DRESSINGS) ×3 IMPLANT
BLADE SURG 15 STRL LF DISP TIS (BLADE) ×1 IMPLANT
BLADE SURG 15 STRL SS (BLADE) ×3
BLADE SURG SZ11 CARB STEEL (BLADE) ×3 IMPLANT
CHLORAPREP W/TINT 26ML (MISCELLANEOUS) ×3 IMPLANT
COVER PROBE W GEL 5X96 (DRAPES) ×2 IMPLANT
COVER SURGICAL LIGHT HANDLE (MISCELLANEOUS) ×3 IMPLANT
DECANTER SPIKE VIAL GLASS SM (MISCELLANEOUS) ×3 IMPLANT
DERMABOND ADVANCED (GAUZE/BANDAGES/DRESSINGS) ×2
DERMABOND ADVANCED .7 DNX12 (GAUZE/BANDAGES/DRESSINGS) ×1 IMPLANT
DRAPE C-ARM 42X120 X-RAY (DRAPES) ×3 IMPLANT
DRAPE LAPAROSCOPIC ABDOMINAL (DRAPES) ×3 IMPLANT
ELECT PENCIL ROCKER SW 15FT (MISCELLANEOUS) ×3 IMPLANT
ELECT REM PT RETURN 15FT ADLT (MISCELLANEOUS) ×3 IMPLANT
GAUZE SPONGE 4X4 12PLY STRL (GAUZE/BANDAGES/DRESSINGS) ×3 IMPLANT
GAUZE SPONGE 4X4 16PLY XRAY LF (GAUZE/BANDAGES/DRESSINGS) ×3 IMPLANT
GLOVE BIOGEL PI IND STRL 7.0 (GLOVE) ×1 IMPLANT
GLOVE BIOGEL PI INDICATOR 7.0 (GLOVE) ×2
GLOVE SURG SS PI 7.0 STRL IVOR (GLOVE) ×3 IMPLANT
GOWN STRL REUS W/TWL LRG LVL3 (GOWN DISPOSABLE) ×3 IMPLANT
GOWN STRL REUS W/TWL XL LVL3 (GOWN DISPOSABLE) ×3 IMPLANT
KIT BASIN OR (CUSTOM PROCEDURE TRAY) ×3 IMPLANT
KIT PORT POWER 8FR ISP CVUE (Miscellaneous) ×3 IMPLANT
NEEDLE HYPO 22GX1.5 SAFETY (NEEDLE) ×3 IMPLANT
PACK BASIC VI WITH GOWN DISP (CUSTOM PROCEDURE TRAY) ×3 IMPLANT
SUT MNCRL AB 4-0 PS2 18 (SUTURE) ×3 IMPLANT
SUT VIC AB 2-0 SH 18 (SUTURE) ×2 IMPLANT
SUT VIC AB 2-0 SH 27 (SUTURE)
SUT VIC AB 2-0 SH 27X BRD (SUTURE) IMPLANT
SUT VIC AB 3-0 SH 27 (SUTURE) ×3
SUT VIC AB 3-0 SH 27XBRD (SUTURE) ×1 IMPLANT
SYR 10ML LL (SYRINGE) ×3 IMPLANT
SYR 20CC LL (SYRINGE) ×3 IMPLANT
TOWEL OR 17X26 10 PK STRL BLUE (TOWEL DISPOSABLE) ×3 IMPLANT
TOWEL OR NON WOVEN STRL DISP B (DISPOSABLE) ×3 IMPLANT

## 2017-11-04 NOTE — Progress Notes (Signed)
CT chest results were called to the RN by the Radiologist tech. RN made Dr. Lebron Conners aware, as he was the ordering MD. He states he will review the report in Epic. Per Dr. Lebron Conners, there is no need to notify Dr. Rodena Piety of the results at this time, he will communicate with her if needed.   Othella Boyer [facility] 11/04/2017 11:25 AM

## 2017-11-04 NOTE — Anesthesia Procedure Notes (Signed)
Procedure Name: LMA Insertion Date/Time: 11/04/2017 11:55 AM Performed by: Lind Covert, CRNA Pre-anesthesia Checklist: Patient identified, Suction available, Emergency Drugs available, Patient being monitored and Timeout performed Patient Re-evaluated:Patient Re-evaluated prior to induction Oxygen Delivery Method: Circle system utilized Preoxygenation: Pre-oxygenation with 100% oxygen Induction Type: IV induction LMA: LMA inserted LMA Size: 4.0 Tube type: Oral Number of attempts: 1 Placement Confirmation: positive ETCO2 and breath sounds checked- equal and bilateral Tube secured with: Tape Dental Injury: Teeth and Oropharynx as per pre-operative assessment

## 2017-11-04 NOTE — Telephone Encounter (Signed)
Currently inpatient-room 1440. Dr. Lebron Conners requested that inpatient pharmacy be made aware of possible chemo on Wednesday 11/28 [facility]). Spoke to pharmacist and made her aware. Also spoke to charge nurse Letitia Libra, on 3W and made her aware of possible transfer to 3W for chemo on Wednesday. Unit secretary on 4W stated she would let RN know about possible transfer/chemo on Wednesday.

## 2017-11-04 NOTE — Transfer of Care (Signed)
Immediate Anesthesia Transfer of Care Note  Patient: [name]  Procedure(s) Performed: INSERTION PORT-A-CATH (N/A )  Patient Location: PACU  Anesthesia Type:General  Level of Consciousness: sedated  Airway & Oxygen Therapy: Patient Spontanous Breathing and Patient connected to face mask oxygen  Post-op Assessment: Report given to RN and Post -op Vital signs reviewed and stable  Post vital signs: Reviewed and stable  Last Vitals:  Vitals:   11/03/17 2213 11/04/17 0443  BP: 115/67 99/62  Pulse: 86 94  Resp: 18 18  Temp: 37.1 C 36.8 C  SpO2: 99% 98%    Last Pain:  Vitals:   11/04/17 1106  TempSrc:   PainSc: 0-No pain      Patients Stated Pain Goal: 4 (65/78/46 9629)  Complications: No apparent anesthesia complications

## 2017-11-04 NOTE — Progress Notes (Signed)
IP PROGRESS NOTE  Subjective:  She denies any new complaints this Friday.  Has been evaluated by general surgery.  No plan direct surgery for the duodenal mass.  Will likely undergo placement of Infuse-a-Port on our request today.  Objective: Vital signs in last 24 hours: Blood pressure 99/62, pulse 94, temperature 98.2 F (36.8 C), temperature source Oral, resp. rate 18, height _0  (1.676 m), weight 172 lb 13.5 oz (78.4 kg), SpO2 98 %.  Intake/Output from previous day: 11/25 0701 - 11/26 0700 In: 842 [P.O.:480; Blood:362] Out: -   Physical Exam:  Patient appears fatigued, AAOx3 HEENT: Anicteric, most mucous membranes Lungs: Clear to auscultation bilaterally Cardiac: S1/S2, regular, no murmurs Abdomen: Soft, non-tender, non-distended. No voluminous ascites by physical examination Lymph nodes: No palpable lymphadenopathy Neurologic: No gross focal neurological deficits Skin: No jaundice. Musculoskeletal: Muscle wasting noted.    Lab Results: Recent Labs    11/03/17 0610 11/04/17 0506  WBC 7.6 9.1  HGB 7.1* 8.3*  HCT 22.2* 26.3*  PLT 262 270    BMET Recent Labs    11/03/17 0610 11/04/17 0506  NA 138 139  K 3.4* 3.4*  CL 109 107  CO2 23 27  GLUCOSE 128* 120*  BUN 8 11  CREATININE 0.63 0.67  CALCIUM 8.1* 8.3*    No results found for: CEA1  Studies/Results: No results found.  Medications: I have reviewed the patient's current medications.  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 Additional history significant for presence of germline BRCA2 mutation with corresponding oncological history in the family. Based on the origin on the extent of the disease, patient has a stage IV pancreatic cancer which is not a curable disease at this time. With that in mind, any approach to treatment will be palliative in nature.  Current strategy of palliative  systemic therapy includes initiation of systemic chemotherapy with most active regimens including FOLFIRINOX and gemcitabine/nab-paclitaxel.  Based on BRCA2 positivity, second line of therapy may be olaparib, an oral agent that targets the PARP and has shown promising activity in a variety of tumors driven by BRCA2 mutation, including ovarian, prostate, breast, and pancreatic cancers.  Additional consideration can be given to palliative radiation to the primary lesion to obtain faster hemostasis.  If no radiation is feasible, I would like to proceed with systemic chemotherapy while patient is still hospitalized, and as early as possibly on Wednesday this week.  Recommendations: --Consult Radiation Oncology for possible XRT to duodenum for hemostasis --Continue transfusion and supportive care --Possible transfer to 3W and systemic chemotherapy on Wednesday with FOLFIRINOX     LOS: 6 days   Ardath Sax, MD   11/04/2017, 7:55 AM

## 2017-11-04 NOTE — Op Note (Signed)
Preoperative diagnosis: metastatic pancreatic cancer  Postoperative diagnosis: same  Procedure: insertion of right internal jugular port-a-cath with ultrasound and fluoro guidance  Surgeon: Gurney Maxin, M.D.  Asst: none  Anesthesia: gen   Indications for procedure: 56 yo male with metastatic pancreatic cancer planning to begin chemotherapy soon  Description of procedure: The patient was brought into the operative suite, anesthesia was administered with LMA, both arms were tucked with offloading foam over all pressure points. The patient was prepped and draped in the usual sterile fashion. Next WHO checklist was completed. The patient was put in trendelenberg, the [place] was used to assess anatomy and the RIJ was large and lay lateral to the the common carotid artery A 18ga needle was used to gain access to the RIJ using ultrasound guidance. Nonpulsatile flow was seen and the J-wire was advanced without tension. XR was used to confirm placement within the venous system. No arrthymias were seen, the wire was clamped in place. The percutaneous access site was widened with a 11 blade. Local anesthesia was used to anesthetize the space inferior to the right clavicle. Next a 3cm incision was made 3cm inferior to the clavicle. Cautery was used to create a pocket along the fascia inferior to incision. The tunneling device was used to make a wide turn from the pocket up to the perc access site. The introducer and sheath were then thread over the J wire in seldinger technique and wire was removed. Next the catheter was thread from the incision to the access site and inserted into the sheath after the introducer was removed. Sheath was then pulled and removed. XR showed the tip of catheter to be at the atrial-caval junction. The port was then attached to the catheter cutting the catheter at appropriate length. The port was then placed in pocket and sewed to the fascia in two places with a 2-0 vicryl. The port was  accessed with huber needel and flushed and drew easily and the port was flushed with concentrated heparin. The incision was closed with 3-0 vicryl followed with 4-0 monocryl in running subcu fashion. A single 4-0 was used to close the access site. Liqui-band was placed for dressing. Patient was extubated and brought to pacu in stable condition.  Findings: patent RIJ, catheter tip at the SVC atrial junction  Specimen: none  Blood loss: 10cc  Local anesthesia: 10cc 0.25% marcaine w epi  Complications: none  Gurney Maxin, M.D. General, Bariatric, & Minimally Invasive Surgery [facility]

## 2017-11-04 NOTE — Progress Notes (Signed)
PROGRESS NOTE    [name]  KPQ:[id number] DOB: 08/19/1961 DOA: 10/29/2017 PCP: [name], MD   Brief Narrative:56 y.o.malewith medical history significant ofhypertension, diabetes, family history of ovarian and prostate cancer, family history positive for BRCA2 gene, hyperlipidemia who presents to the ED with a 1-1/2-week history of upper abdominal pain occasionally radiating to his back, dizziness, lightheadedness, black tarry stools, generalized weakness. Patient also endorses a 20 pound weight loss over the past 2 months with a decreased appetite.Patient had presented to his PCPs office or lab work which was obtained showed a new anemia with a hemoglobin of 8.1. He was sent to the ED where labs showed a white count of 13.5, hemoglobin of 7.8, trending down to 7.0, platelets of 356 otherwise was within normal limits. Basic metabolic profile obtained had a sodium of 133 glucose of 111 BUN of 36 creatinine of 1.17. CT abdomen and pelvis demonstrated a pancreatic mass and multiple hepatic lesions. 1u PRBCs given with improvement to hgb 7.7g/dl. EGD 11/21 demonstrated a necrotic duodenal mass with adherent clot and bleeding and erosive esophagitis. IR was consulted for biopsy of hepatic lesion, performed 11/21. Clear liquids were started, and pt has had a single melanotic stool on 11/22. Diet was successfully advanced with resolution of gross melena. Hemoglobin trended downward prompting 1u PRBC transfusion 11/25. Post placement is planned.    Assessment & Plan:   Principal Problem:   GI bleed Active Problems:   Diabetes type 2, controlled (HCC)   Hypertension   Symptomatic anemia   Acute blood loss anemia   Pancreatic mass: Per CT abd/pelvis 10/29/2017   Dehydration   Leukocytosis   Metastases to the liver [facility])   Stage IV adenocarcinoma of pancreas (HCC)  Pancreatic mass eroding into duodenum with likely metastatic lesion on liver: Initial pathology consistent with  adenocarcinoma, likely pancreatic metastatic to liver. CA 19-9 grossly elevated at 6,905.  - Dr. Lebron Conners has seen patient, plan to start palliative systemic chemotherapy with BRCA-targeting agent.  - Surgery planning port placement 11/26 tentatively. Made NPO p MN.   Consult called into radiation oncology today.  CT scan of the chest shows  No acute process or evidence of metastatic disease in the chest. 2. Hepatic and upper abdominal nodal metastasis, as before. 3. New gallbladder wall thickening. Correlate with right upper quadrant symptoms. Consider dedicated ultrasound. 4. Small volume perihepatic ascites, new.  Will order right upper quadrant ultrasound.  Acute GI bleed with acute blood loss anemia: Due to necrotic duodenal mass noted on EGD.  - Continue PPI, tolerating advanced diet per GI (signed off 11/23) - Hgb trending slowly downward to 7.1g/dl 11/25 with surgery planned. Will transfuse 1u PRBCs today and recheck CBC in AM.    Hemoglobin 8.3 today. - Surgery consulted for consideration of palliative resection, not likely indicated, but remain consulted for port placement.  Hypokalemia-recheck levels     DVT prophylaxis: Heparin Code Status: Full code Family Communication: None Disposition Plan: TBD Consultants:  Oncology, radiation oncology, GI Procedures: Port placement 1126 Antimicrobials: None  Subjective: Feels well denies any pain nausea vomiting diarrhea.   Objective: Resting in bed in no acute distress Vitals:   11/03/17 2100 11/03/17 2213 11/04/17 0443 11/04/17 1106  BP: 112/61 115/67 99/62   Pulse: 87 86 94   Resp: 18 18 18    Temp: 100.1 F (37.8 C) 98.7 F (37.1 C) 98.2 F (36.8 C)   TempSrc: Oral Oral Oral   SpO2: 95% 99% 98%  Weight:   78.4 kg (172 lb 13.5 oz) 78 kg (172 lb)  Height:    5' 6"  (1.676 m)    Intake/Output Summary (Last 24 hours) at 11/04/2017 1252 Last data filed at 11/04/2017 1239 Gross per 24 hour  Intake 1302 ml  Output -    Net 1302 ml   Filed Weights   11/03/17 0500 11/04/17 0443 11/04/17 1106  Weight: 78.6 kg (173 lb 4.5 oz) 78.4 kg (172 lb 13.5 oz) 78 kg (172 lb)    Examination:  General exam: Appears calm and comfortable  Respiratory system: Clear to auscultation. Respiratory effort normal. Cardiovascular system: S1 & S2 heard, RRR. No JVD, murmurs, rubs, gallops or clicks. No pedal edema. Gastrointestinal system: Abdomen is nondistended, soft and nontender. No organomegaly or masses felt. Normal bowel sounds heard. Central nervous system: Alert and oriented. No focal neurological deficits. Extremities: Symmetric 5 x 5 power. Skin: No rashes, lesions or ulcers Psychiatry: Judgement and insight appear normal. Mood & affect appropriate.     Data Reviewed: I have personally reviewed following labs and imaging studies  CBC: Recent Labs  Lab 10/31/17 1732 11/01/17 0439 11/02/17 0459 11/03/17 0610 11/04/17 0506  WBC 10.2 7.8 8.2 7.6 9.1  NEUTROABS 8.3* 6.4 6.5 6.1 7.4  HGB 8.5* 7.4* 7.4* 7.1* 8.3*  HCT 27.5* 23.1* 23.4* 22.2* 26.3*  MCV 84.1 84.0 84.2 83.8 83.5  PLT 294 239 264 262 295   Basic Metabolic Panel: Recent Labs  Lab 10/29/17 1818  10/31/17 0511 11/01/17 0439 11/02/17 0459 11/03/17 0610 11/04/17 0506  NA  --    < > 138 139 140 138 139  K  --    < > 4.2 3.9 3.5 3.4* 3.4*  CL  --    < > 109 111 110 109 107  CO2  --    < > 22 22 23 23 27   GLUCOSE  --    < > 104* 104* 114* 128* 120*  BUN  --    < > 10 6 6 8 11   CREATININE  --    < > 0.74 0.75 0.64 0.63 0.67  CALCIUM  --    < > 8.5* 8.3* 8.3* 8.1* 8.3*  MG 2.0  --   --   --   --   --   --    < > = values in this interval not displayed.   GFR: Estimated Creatinine Clearance: 102.6 mL/min (by C-G formula based on SCr of 0.67 mg/dL). Liver Function Tests: Recent Labs  Lab 10/30/17 0921 10/31/17 0511 11/01/17 0439 11/02/17 0459 11/03/17 0610  AST 21 22 23 30 29   ALT 18 18 15* 20 23  ALKPHOS 93 92 92 111 102  BILITOT  1.0 0.7 0.6 0.6 0.7  PROT 6.2* 6.0* 5.7* 5.6* 5.4*  ALBUMIN 2.8* 2.6* 2.5* 2.5* 2.4*   Recent Labs  Lab 10/29/17 1337  LIPASE 45   No results for input(s): AMMONIA in the last 168 hours. Coagulation Profile: Recent Labs  Lab 10/30/17 0106  INR 1.09   Cardiac Enzymes: Recent Labs  Lab 10/29/17 1337  TROPONINI <0.03   BNP (last 3 results) No results for input(s): PROBNP in the last 8760 hours. HbA1C: No results for input(s): HGBA1C in the last 72 hours. CBG: Recent Labs  Lab 11/03/17 0730 11/03/17 1130 11/03/17 1638 11/03/17 2059 11/04/17 0737  GLUCAP 121* 131* 103* 117* 117*   Lipid Profile: No results for input(s): CHOL, HDL, LDLCALC, TRIG, CHOLHDL, LDLDIRECT in the  last 72 hours. Thyroid Function Tests: No results for input(s): TSH, T4TOTAL, FREET4, T3FREE, THYROIDAB in the last 72 hours. Anemia Panel: No results for input(s): VITAMINB12, FOLATE, FERRITIN, TIBC, IRON, RETICCTPCT in the last 72 hours. Sepsis Labs: No results for input(s): PROCALCITON, LATICACIDVEN in the last 168 hours.  Recent Results (from the past 240 hour(s))  Culture, Urine     Status: None   Collection Time: 10/30/17  4:34 AM  Result Value Ref Range Status   Specimen Description URINE, CLEAN CATCH  Final   Special Requests NONE  Final   Culture   Final    NO GROWTH Performed at [facility], [address], [place], [place] [address]    Report Status 10/31/2017 FINAL  Final         Radiology Studies: Ct Chest W Contrast  Result Date: 11/04/2017 CLINICAL DATA:  New diagnosis of pancreatic cancer.  Nonsmoker. EXAM: CT CHEST WITH CONTRAST TECHNIQUE: Multidetector CT imaging of the chest was performed during intravenous contrast administration. CONTRAST:  86m ISOVUE-300 IOPAMIDOL (ISOVUE-300) INJECTION 61% COMPARISON:  Plain film 10/29/2017.  abdominopelvic CT 10/29/2017. FINDINGS: Cardiovascular: Aortic and branch vessel atherosclerosis. Normal heart size, without  pericardial effusion. Lad coronary artery atherosclerosis. No central pulmonary embolism, on this non-dedicated study. Mediastinum/Nodes: No supraclavicular adenopathy. No mediastinal or hilar adenopathy. Lungs/Pleura: No pleural fluid. Bibasilar scarring. No suspicious pulmonary nodule or mass. Upper Abdomen: Multifocal hepatic metastasis. Index right hepatic lobe lesion is unchanged at 5.7 cm. Normal imaged portions of the spleen, stomach, pancreas, adrenal glands, left kidney. New gallbladder wall thickening, incompletely imaged. Small volume perihepatic ascites is new. Necrotic adenopathy in the porta hepatis is again identified. Musculoskeletal: No acute osseous abnormality. IMPRESSION: 1.  No acute process or evidence of metastatic disease in the chest. 2. Hepatic and upper abdominal nodal metastasis, as before. 3. New gallbladder wall thickening. Correlate with right upper quadrant symptoms. Consider dedicated ultrasound. 4. Small volume perihepatic ascites, new. 5. Coronary artery atherosclerosis. Aortic Atherosclerosis (ICD10-I70.0). These results will be called to the ordering clinician or representative by the Radiologist Assistant, and communication documented in the PACS or zVision Dashboard. Electronically Signed   By: KAbigail MiyamotoM.D.   On: 11/04/2017 10:44   Dg C-arm 1-60 Min-no Report  Result Date: 11/04/2017 Fluoroscopy was utilized by the requesting physician.  No radiographic interpretation.        Scheduled Meds: . heparin 6000 unit irrigation   Irrigation Once  . [MAR Hold] insulin aspart  0-5 Units Subcutaneous QHS  . [MAR Hold] insulin aspart  0-9 Units Subcutaneous TID WC  . iopamidol      . [MAR Hold] multivitamin with minerals  1 tablet Oral Daily  . [MAR Hold] pantoprazole  40 mg Intravenous Q12H   Continuous Infusions: . lactated ringers 1,000 mL (11/04/17 1123)     LOS: 6 days      EGeorgette Shell MD [facility] If 7PM-7AM, please contact  night-coverage [email or web address] Password [email or web address]11/26/2018, 12:52 PM

## 2017-11-04 NOTE — Anesthesia Postprocedure Evaluation (Signed)
Anesthesia Post Note  Patient: [name]  Procedure(s) Performed: INSERTION PORT-A-CATH (N/A )     Patient location during evaluation: PACU Anesthesia Type: General Level of consciousness: awake and alert Pain management: pain level controlled Vital Signs Assessment: post-procedure vital signs reviewed and stable Respiratory status: spontaneous breathing, nonlabored ventilation, respiratory function stable and patient connected to nasal cannula oxygen Cardiovascular status: blood pressure returned to baseline and stable Postop Assessment: no apparent nausea or vomiting Anesthetic complications: no    Last Vitals:  Vitals:   11/04/17 1330 11/04/17 1339  BP: 102/68 108/66  Pulse: 76 78  Resp: 13 14  Temp: 36.8 C 36.8 C  SpO2: 94% 95%    Last Pain:  Vitals:   11/04/17 1339  TempSrc:   PainSc: 0-No pain                 Blimi Godby,JAMES TERRILL

## 2017-11-04 NOTE — Anesthesia Preprocedure Evaluation (Signed)
Anesthesia Evaluation  Patient identified by MRN, date of birth, ID band Patient awake    Reviewed: Allergy & Precautions, NPO status , Patient's Chart, lab work & pertinent test results  History of Anesthesia Complications Negative for: history of anesthetic complications  Airway Mallampati: II  TM Distance: <3 FB Neck ROM: Full    Dental  (+) Teeth Intact   Pulmonary neg pulmonary ROS,    breath sounds clear to auscultation       Cardiovascular hypertension,  Rhythm:Regular Rate:Normal     Neuro/Psych negative neurological ROS     GI/Hepatic Panc ca and mtes   Endo/Other  diabetes  Renal/GU      Musculoskeletal   Abdominal   Peds  Hematology  (+) anemia ,   Anesthesia Other Findings   Reproductive/Obstetrics                             Anesthesia Physical Anesthesia Plan  ASA: III  Anesthesia Plan: General   Post-op Pain Management:    Induction: Intravenous  PONV Risk Score and Plan: 3 and Treatment may vary due to age or medical condition, Ondansetron and Dexamethasone  Airway Management Planned: LMA  Additional Equipment:   Intra-op Plan:   Post-operative Plan: Extubation in OR  Informed Consent: I have reviewed the patients History and Physical, chart, labs and discussed the procedure including the risks, benefits and alternatives for the proposed anesthesia with the patient or authorized representative who has indicated his/her understanding and acceptance.   Dental advisory given  Plan Discussed with: CRNA  Anesthesia Plan Comments:         Anesthesia Quick Evaluation

## 2017-11-04 NOTE — Progress Notes (Signed)
  Radiation Oncology         [phone]) [phone] ________________________________  Name: [name] MRN: [id number]  Date: 11/04/2017  DOB: 02/15/1961  INPATIENT  SIMULATION AND TREATMENT PLANNING NOTE    ICD-10-CM   1. Stage IV adenocarcinoma of pancreas (HCC) C25.9     DIAGNOSIS:  56 yo man with GI bleed from a locally advanced metastatic pancreatic head cancer.  NARRATIVE:  The patient was brought to the [facility].  Identity was confirmed.  All relevant records and images related to the planned course of therapy were reviewed.  The patient freely provided informed written consent to proceed with treatment after reviewing the details related to the planned course of therapy. The consent form was witnessed and verified by the simulation staff.  Then, the patient was set-up in a stable reproducible  supine position for radiation therapy.  CT images were obtained.  Surface markings were placed.  The CT images were loaded into the planning software.  Then the target and avoidance structures were contoured.  Treatment planning then occurred.  The radiation prescription was entered and confirmed.  Then, I designed and supervised the construction of a total of 4 medically necessary complex treatment devices to shield critical kidneys, spinal cord and bowel.  I have requested : 3D Simulation  I have requested a DVH of the following structures: Left kidney, right kidney, spinal cord, bowel and tumor.   PLAN:  The patient will receive 20 Gy in 5 fractions for palliation/hemostasis.  I'll defer to the inpatient team regarding length of stay, but, it may take several days to achieve hemostasis.  .  ________________________________  Sheral Apley Tammi Klippel, M.D.

## 2017-11-04 NOTE — Progress Notes (Signed)
Patient ID: [name], male   DOB: 08-06-1961, 56 y.o.   MRN: [id number]  Progress Note: General Surgery Service   Assessment/Plan: Patient Active Problem List   Diagnosis Date Noted  . Stage IV adenocarcinoma of pancreas ([place])   . Metastases to the liver ([place])   . GI bleed 10/29/2017  . Symptomatic anemia 10/29/2017  . Acute blood loss anemia 10/29/2017  . Pancreatic mass: Per CT abd/pelvis 10/29/2017 10/29/2017  . Dehydration 10/29/2017  . Leukocytosis 10/29/2017  . Genetic testing 01/07/2017  . BRCA2 positive 01/07/2017  . Family history of ovarian cancer   . Family history of prostate cancer   . Family history of BRCA2 gene positive   . Elevated LFTs 09/20/2013  . Annual physical exam 04/21/2012  . Thyromegaly 04/21/2012  . SKIN LESION 01/13/2010  . Diabetes type 2, controlled ([place]) 06/12/2007  . HYPERLIPIDEMIA 01/04/2007  . Hypertension 01/04/2007   s/p Procedure(s): ESOPHAGOGASTRODUODENOSCOPY (EGD) WITH PROPOFOL 10/30/2017  NPO -discussed details of port a cath insertion again -plan for surgery today   LOS: 6 days  Chief Complaint/Subjective: No complaints overnight  Objective: Vital signs in last 24 hours: Temp:  [98.2 F (36.8 C)-100.1 F (37.8 C)] 98.2 F (36.8 C) (11/26 0443) Pulse Rate:  [84-94] 94 (11/26 0443) Resp:  [12-18] 18 (11/26 0443) BP: (99-116)/(54-67) 99/62 (11/26 0443) SpO2:  [95 %-99 %] 98 % (11/26 0443) Weight:  [78.4 kg (172 lb 13.5 oz)] 78.4 kg (172 lb 13.5 oz) (11/26 0443) Last BM Date: 11/02/17  Intake/Output from previous day: 11/25 0701 - 11/26 0700 In: 842 [P.O.:480; Blood:362] Out: -  Intake/Output this shift: No intake/output data recorded.  Lungs: CTAB  Cardiovascular: RRR  Abd: soft, ND  Extremities: no edema  Neuro: AOx4  Lab Results: CBC  Recent Labs    11/03/17 0610 11/04/17 0506  WBC 7.6 9.1  HGB 7.1* 8.3*  HCT 22.2* 26.3*  PLT 262 270   BMET Recent Labs    11/03/17 0610 11/04/17 0506    NA 138 139  K 3.4* 3.4*  CL 109 107  CO2 23 27  GLUCOSE 128* 120*  BUN 8 11  CREATININE 0.63 0.67  CALCIUM 8.1* 8.3*   PT/INR No results for input(s): LABPROT, INR in the last 72 hours. ABG No results for input(s): PHART, HCO3 in the last 72 hours.  Invalid input(s): PCO2, PO2  Studies/Results:  Anti-infectives: Anti-infectives (From admission, onward)   Start     Dose/Rate Route Frequency Ordered Stop   11/04/17 0745  ceFAZolin (ANCEF) IVPB 2g/100 mL premix     2 g 200 mL/hr over 30 Minutes Intravenous On call to O.R. 11/04/17 2641 11/05/17 0559      Medications: Scheduled Meds: . insulin aspart  0-5 Units Subcutaneous QHS  . insulin aspart  0-9 Units Subcutaneous TID WC  . multivitamin with minerals  1 tablet Oral Daily  . pantoprazole  40 mg Intravenous Q12H   Continuous Infusions: .  ceFAZolin (ANCEF) IV     PRN Meds:.acetaminophen **OR** acetaminophen, ondansetron **OR** ondansetron (ZOFRAN) IV, oxymetazoline, sorbitol, traMADol  Mickeal Skinner, MD Pg# [phone] [facility]

## 2017-11-05 ENCOUNTER — Ambulatory Visit
Admit: 2017-11-05 | Discharge: 2017-11-05 | Disposition: A | Discharge status: Home / Self Care | Payer: BLUE CROSS/BLUE SHIELD | Attending: Radiation Oncology | Admitting: Radiation Oncology

## 2017-11-05 LAB — CBC WITH DIFFERENTIAL/PLATELET
BASOS ABS: 0 10*3/uL (ref 0.0–0.1)
BASOS PCT: 0 %
EOS ABS: 0 10*3/uL (ref 0.0–0.7)
EOS PCT: 0 %
HCT: 25.9 % — ABNORMAL LOW (ref 39.0–52.0)
HEMOGLOBIN: 8.2 g/dL — AB (ref 13.0–17.0)
Lymphocytes Relative: 7 %
Lymphs Abs: 0.8 10*3/uL (ref 0.7–4.0)
MCH: 26.5 pg (ref 26.0–34.0)
MCHC: 31.7 g/dL (ref 30.0–36.0)
MCV: 83.5 fL (ref 78.0–100.0)
Monocytes Absolute: 0.6 10*3/uL (ref 0.1–1.0)
Monocytes Relative: 6 %
NEUTROS PCT: 87 %
Neutro Abs: 9.5 10*3/uL — ABNORMAL HIGH (ref 1.7–7.7)
PLATELETS: 274 10*3/uL (ref 150–400)
RBC: 3.1 MIL/uL — AB (ref 4.22–5.81)
RDW: 15.1 % (ref 11.5–15.5)
WBC: 10.9 10*3/uL — AB (ref 4.0–10.5)

## 2017-11-05 LAB — BASIC METABOLIC PANEL
ANION GAP: 5 (ref 5–15)
BUN: 13 mg/dL (ref 6–20)
CO2: 27 mmol/L (ref 22–32)
Calcium: 8.5 mg/dL — ABNORMAL LOW (ref 8.9–10.3)
Chloride: 107 mmol/L (ref 101–111)
Creatinine, Ser: 0.64 mg/dL (ref 0.61–1.24)
Glucose, Bld: 110 mg/dL — ABNORMAL HIGH (ref 65–99)
POTASSIUM: 3.8 mmol/L (ref 3.5–5.1)
SODIUM: 139 mmol/L (ref 135–145)

## 2017-11-05 LAB — GLUCOSE, CAPILLARY
GLUCOSE-CAPILLARY: 102 mg/dL — AB (ref 65–99)
GLUCOSE-CAPILLARY: 131 mg/dL — AB (ref 65–99)
Glucose-Capillary: 108 mg/dL — ABNORMAL HIGH (ref 65–99)
Glucose-Capillary: 164 mg/dL — ABNORMAL HIGH (ref 65–99)

## 2017-11-05 LAB — MAGNESIUM: MAGNESIUM: 1.8 mg/dL (ref 1.7–2.4)

## 2017-11-05 MED ORDER — SODIUM CHLORIDE 0.9 % IV SOLN: Freq: Once | INTRAVENOUS | Status: AC

## 2017-11-05 NOTE — Consult Note (Signed)
Radiation Oncology         [phone] ________________________________  Initial inpatient Consultation  Name: [name] MRN: [id number]  Date of Service: 11/04/17    DOB: 08/28/1961  Benitez:UVOZDG, Cristian Patrick, MD  No ref. provider found   REFERRING PHYSICIAN: No ref. provider found  DIAGNOSIS: The primary encounter diagnosis was Symptomatic anemia. Diagnoses of Gastrointestinal hemorrhage, unspecified gastrointestinal hemorrhage type, Pancreatic mass, Metastases to the liver [facility]), GI bleed, Liver lesion, and Mass were also pertinent to this visit.    ICD-10-CM   1. Symptomatic anemia D64.9   2. Gastrointestinal hemorrhage, unspecified gastrointestinal hemorrhage type K92.2   3. Pancreatic mass K86.9   4. Metastases to the liver (HCC) C78.7   5. GI bleed K92.2   6. Liver lesion K76.9 IR US Guide Bx Asp/Drain    IR US Guide Bx Asp/Drain    CANCELED: US Guided Needle Placement    CANCELED: US Guided Needle Placement    CANCELED: IR US Guide Bx Asp/Drain    CANCELED: IR US Guide Bx Asp/Drain    CANCELED: US BIOPSY (LIVER)    CANCELED: US BIOPSY (LIVER)  7. Mass R22.9 US Abdomen Limited RUQ    US Abdomen Limited RUQ    HISTORY OF PRESENT ILLNESS: [name] is a 56 y.o. male seen at the request of Dr. Rodman Key with a recently diagnosed advanced stage pancreas cancer. The patient has a known family history of BRCA diseases and presented with several weeks of weight loss, poor appetite, and intermittent postprandial abdominal pain. He was very weak and was found to be severely anemic. Imaging in the ED revealed a mass in the pancreatic region along with what appeared to be disease in the liver. He has been transfusion dependant and has remained the past few days with a hgb of 7-8 range. He had a CA 19-9 of 6905, and EGD on 10/30/17 revealed low grade esophagitis,a nd large infiltrative mass with active bleeding in the duodenum. A biopsy confirmed this as adenocarcinoma of  pancreatic primary. He is seen today to consider palliative radiotherapy for hemostasis.   PREVIOUS RADIATION THERAPY: No  PAST MEDICAL HISTORY:  Past Medical History:  Diagnosis Date  . BRCA2 positive   . Diabetes mellitus 2008  . Family history of BRCA2 gene positive   . Family history of ovarian cancer   . Family history of prostate cancer   . Fatty liver 2008   Fatty liver, saw GI    . Hyperlipidemia   . Hypertension       PAST SURGICAL HISTORY: Past Surgical History:  Procedure Laterality Date  . ESOPHAGOGASTRODUODENOSCOPY (EGD) WITH PROPOFOL N/A 10/30/2017   Procedure: ESOPHAGOGASTRODUODENOSCOPY (EGD) WITH PROPOFOL;  Surgeon: Mauri Pole, MD;  Location: [facility] ENDOS[facility];  Service: Endoscopy;  Laterality: N/A;  . IR US GUIDE BX ASP/DRAIN  10/30/2017  . NO PAST SURGERIES    . PORTACATH PLACEMENT N/A 11/04/2017   Procedure: INSERTION PORT-A-CATH;  Surgeon: Kinsinger, Arta Bruce, MD;  Location: [facility] ORS;  Service: General;  Laterality: N/A;    FAMILY HISTORY:  Family History  Problem Relation Age of Onset  . Diabetes Father   . Lung cancer Father        smoker  . Stroke Mother   . Ovarian cancer Sister 12  . BRCA 1/2 Sister        BRCA2 pos  . Leukemia Maternal Aunt   . Stomach cancer Maternal Grandfather   . Prostate cancer Paternal Grandfather   .  Melanoma Sister   . BRCA 1/2 Sister        BRCA2 negative  . Leukemia Cousin        maternal first cousin  . Heart attack Neg Hx   . Colon cancer Neg Hx     SOCIAL HISTORY:  Social History   Socioeconomic History  . Marital status: Single    Spouse name: Not on file  . Number of children: 0  . Years of education: Not on file  . Highest education level: Not on file  Social Needs  . Financial resource strain: Not on file  . Food insecurity - worry: Not on file  . Food insecurity - inability: Not on file  . Transportation needs - medical: Not on file  . Transportation needs - non-medical: Not on file    Occupational History  . Occupation: [employer]    Employer: [employer]  Tobacco Use  . Smoking status: Never Smoker  . Smokeless tobacco: Never Used  Substance and Sexual Activity  . Alcohol use: No  . Drug use: No  . Sexual activity: Not on file  Other Topics Concern  . Not on file  Social History Narrative   Lives by himself                 ALLERGIES: Patient has no known allergies.  MEDICATIONS:  Current Facility-Administered Medications  Medication Dose Route Frequency Provider Last Rate Last Dose  . acetaminophen (TYLENOL) tablet 650 mg  650 mg Oral Q6H PRN Eugenie Filler, MD   650 mg at 11/03/17 1354   Or  . acetaminophen (TYLENOL) suppository 650 mg  650 mg Rectal Q6H PRN Eugenie Filler, MD      . heparin 6,000 Units in sodium chloride 0.9 % 500 mL irrigation   Irrigation Once Kinsinger, Arta Bruce, MD      . HYDROcodone-acetaminophen (NORCO/VICODIN) 5-325 MG per tablet 1 tablet  1 tablet Oral Q4H PRN Kinsinger, Arta Bruce, MD      . insulin aspart (novoLOG) injection 0-5 Units  0-5 Units Subcutaneous QHS Eugenie Filler, MD      . insulin aspart (novoLOG) injection 0-9 Units  0-9 Units Subcutaneous TID WC Eugenie Filler, MD   1 Units at 11/04/17 1722  . multivitamin with minerals tablet 1 tablet  1 tablet Oral Daily Eugenie Filler, MD   1 tablet at 11/03/17 1012  . ondansetron (ZOFRAN) tablet 4 mg  4 mg Oral Q6H PRN Eugenie Filler, MD       Or  . ondansetron [facility]) injection 4 mg  4 mg Intravenous Q6H PRN Eugenie Filler, MD      . oxymetazoline (AFRIN) 0.05 % nasal spray 1 spray  1 spray Each Nare BID PRN Schorr, Rhetta Mura, NP   1 spray at 10/30/17 0200  . pantoprazole (PROTONIX) injection 40 mg  40 mg Intravenous Q12H Eugenie Filler, MD   40 mg at 11/04/17 2206  . sodium chloride flush (NS) 0.9 % injection 10-40 mL  10-40 mL Intracatheter PRN Alphonsa Overall, MD   10 mL at 11/05/17 0527  . sorbitol 70 % solution 30 mL  30 mL Oral Daily  PRN Eugenie Filler, MD      . traMADol Veatrice Bourbon) tablet 50 mg  50 mg Oral Q6H PRN Eugenie Filler, MD        REVIEW OF SYSTEMS:  On review of systems, the patient reports that he is doing a  bit better with his fatigue and tiredness since receiving blood products. He denies any chest pain, shortness of breath, cough, fevers, chills, night sweats. He continues to have dark stools, and denies  bladder disturbances, and denies current abdominal pain, nausea or vomiting. He denies any new musculoskeletal or joint aches or pains. A complete review of systems is obtained and is otherwise negative.    PHYSICAL EXAM:  Wt Readings from Last 3 Encounters:  11/04/17 172 lb (78 kg)  10/29/17 174 lb (78.9 kg)  07/18/17 192 lb (87.1 kg)   Temp Readings from Last 3 Encounters:  11/05/17 (!) 97.5 F (36.4 C) (Oral)  10/29/17 98.1 F (36.7 C) (Oral)  07/18/17 98.1 F (36.7 C) (Oral)   BP Readings from Last 3 Encounters:  11/05/17 118/66  10/29/17 100/66  07/18/17 129/82   Pulse Readings from Last 3 Encounters:  11/05/17 77  10/29/17 (!) 109  07/18/17 80   Pain Assessment Pain Score: 0-No pain/10  In general this is a chronically ill appearing caucasian male in no acute distress. He is alert and oriented x4 and appropriate throughout the examination. HEENT reveals that the patient is normocephalic, atraumatic. EOMs are intact. PERRLA. Skin is intact without any evidence of gross lesions, but consistent with pallor from his anemia.  Cardiopulmonary assessment is negative for acute distress and he exhibits normal effort. Lower extremities are negative for pretibial pitting edema, deep calf tenderness, cyanosis or clubbing.   KPS = 20  100 - Normal; no complaints; no evidence of disease. 90   - Able to carry on normal activity; minor signs or symptoms of disease. 80   - Normal activity with effort; some signs or symptoms of disease. 82   - Cares for self; unable to carry on normal activity  or to do active work. 60   - Requires occasional assistance, but is able to care for most of his personal needs. 50   - Requires considerable assistance and frequent medical care. 39   - Disabled; requires special care and assistance. 51   - Severely disabled; hospital admission is indicated although death not imminent. 83   - Very sick; hospital admission necessary; active supportive treatment necessary. 10   - Moribund; fatal processes progressing rapidly. 0     - Dead  Karnofsky DA, Abelmann [place], Craver LS and Burchenal [facility] [phone]) The use of the nitrogen mustards in the palliative treatment of carcinoma: with particular reference to bronchogenic carcinoma Cancer 1 634-56  LABORATORY DATA:  Lab Results  Component Value Date   WBC 10.9 (H) 11/05/2017   HGB 8.2 (L) 11/05/2017   HCT 25.9 (L) 11/05/2017   MCV 83.5 11/05/2017   PLT 274 11/05/2017   Lab Results  Component Value Date   NA 139 11/04/2017   K 3.4 (L) 11/04/2017   CL 107 11/04/2017   CO2 27 11/04/2017   Lab Results  Component Value Date   ALT 23 11/03/2017   AST 29 11/03/2017   ALKPHOS 102 11/03/2017   BILITOT 0.7 11/03/2017     RADIOGRAPHY: Dg Chest 2 View  Result Date: 10/29/2017 CLINICAL DATA:  Hypotension. EXAM: CHEST  2 VIEW COMPARISON:  None. FINDINGS: The heart size and mediastinal contours are within normal limits. Both lungs are clear. The visualized skeletal structures are unremarkable. IMPRESSION: No active cardiopulmonary disease. Electronically Signed   By: Ashley Royalty M.D.   On: 10/29/2017 17:56   Ct Chest W Contrast  Result Date: 11/04/2017 CLINICAL DATA:  New  diagnosis of pancreatic cancer.  Nonsmoker. EXAM: CT CHEST WITH CONTRAST TECHNIQUE: Multidetector CT imaging of the chest was performed during intravenous contrast administration. CONTRAST:  58m ISOVUE-300 IOPAMIDOL (ISOVUE-300) INJECTION 61% COMPARISON:  Plain film 10/29/2017.  abdominopelvic CT 10/29/2017. FINDINGS: Cardiovascular: Aortic and  branch vessel atherosclerosis. Normal heart size, without pericardial effusion. Lad coronary artery atherosclerosis. No central pulmonary embolism, on this non-dedicated study. Mediastinum/Nodes: No supraclavicular adenopathy. No mediastinal or hilar adenopathy. Lungs/Pleura: No pleural fluid. Bibasilar scarring. No suspicious pulmonary nodule or mass. Upper Abdomen: Multifocal hepatic metastasis. Index right hepatic lobe lesion is unchanged at 5.7 cm. Normal imaged portions of the spleen, stomach, pancreas, adrenal glands, left kidney. New gallbladder wall thickening, incompletely imaged. Small volume perihepatic ascites is new. Necrotic adenopathy in the porta hepatis is again identified. Musculoskeletal: No acute osseous abnormality. IMPRESSION: 1.  No acute process or evidence of metastatic disease in the chest. 2. Hepatic and upper abdominal nodal metastasis, as before. 3. New gallbladder wall thickening. Correlate with right upper quadrant symptoms. Consider dedicated ultrasound. 4. Small volume perihepatic ascites, new. 5. Coronary artery atherosclerosis. Aortic Atherosclerosis (ICD10-I70.0). These results will be called to the ordering clinician or representative by the Radiologist Assistant, and communication documented in the PACS or zVision Dashboard. Electronically Signed   By: KAbigail MiyamotoM.D.   On: 11/04/2017 10:44   Ct Abdomen Pelvis W Contrast  Result Date: 10/29/2017 CLINICAL DATA:  Fatigue for 1 week and epigastric pain with weight loss EXAM: CT ABDOMEN AND PELVIS WITH CONTRAST TECHNIQUE: Multidetector CT imaging of the abdomen and pelvis was performed using the standard protocol following bolus administration of intravenous contrast. CONTRAST:  1055mISOVUE-300 IOPAMIDOL (ISOVUE-300) INJECTION 61% COMPARISON:  None. FINDINGS: Lower chest: Lung bases are free of acute infiltrate or sizable effusion. Hepatobiliary: The gallbladder is within normal limits. The liver demonstrates multiple  hypodense lesions with peripheral enhancement most consistent with metastatic disease. These do not demonstrate significant enhancement on delayed images. The largest of these in the left lobe of the liver measures approximately 5.1 cm. The largest of these in the right lobe of the liver measures approximately 5.8 cm. No biliary ductal dilatation is seen. Pancreas: The body and tail of the pancreas are within normal limits. In the region of the pancreatic head however in the head as prominent with suggestion of underlying mass lesion. This would correspond with the changes seen in the liver. There is suggestion of mass lesion measures approximately 5 cm in greatest dimension. Some associated adjacent necrotic appearing lymph nodes are seen in the region of the celiac axis (15 mm) as well as in the periaortic region and mesentery (12 mm). Peripancreatic (2.1 cm) and gastrohepatic (11 mm) adenopathy is noted as well. An adjacent duodenal diverticulum is seen with both air and fluid within. Spleen: Normal in size without focal abnormality. Adrenals/Urinary Tract: The adrenal glands are within normal limits. The kidneys demonstrates no obstructive change. Large nonobstructing right renal stone is seen measuring approximately 10 mm. The bladder is partially distended. Stomach/Bowel: Bilateral inguinal hernias are identified with loops of small bowel within the right inguinal hernia and loops of sigmoid colon in the left inguinal hernia. No obstructive changes are seen. The appendix is within normal limits. Scattered diverticular change is noted in the colon without diverticulitis. Large duodenal diverticulum is noted adjacent to the head of the pancreas. Vascular/Lymphatic: Atherosclerotic calcifications are identified. Additionally there are changes of lymphadenopathy along the right common iliac artery measuring approximately 12 mm in short axis.  Periaortic and intra-aortocaval lymph nodes are seen as well as the  previously described changes in the region of the gastrohepatic ligament as well as the peripancreatic region. Some of these nodes are intimately associated with suggested pancreatic head mass. The largest of these measures approximately 2.1 cm in short axis with central necrosis. Reproductive: Prostate is unremarkable. Other: No abdominal wall hernia or abnormality. No abdominopelvic ascites. Musculoskeletal: No acute or significant osseous findings. IMPRESSION: Changes consistent with hepatic metastatic disease and likely primary pancreatic mass in the region of the head of the pancreas. Surrounding associated lymphadenopathy is noted as described. Tissue sampling of the liver lesions is recommended. Additional workup for distant metastatic disease is recommended as well. Nonobstructing right renal stone These results were called by telephone at the time of interpretation on 10/29/2017 at 2:52 pm to [facility] , who verbally acknowledged these results. Electronically Signed   By: Inez Catalina M.D.   On: 10/29/2017 14:56   Ir US Guide Bx Asp/drain  Result Date: 10/30/2017 INDICATION: Concern for metastatic pancreatic cancer. Please perform liver lesion biopsy for tissue diagnostic purposes. EXAM: ULTRASOUND GUIDED LIVER LESION BIOPSY COMPARISON:  CT abdomen and pelvis - 11/20/202=18 MEDICATIONS: None ANESTHESIA/SEDATION: Fentanyl 75 mcg IV; Versed 2 mg IV Total Moderate Sedation time: 11 minutes. The patient's level of consciousness and vital signs were monitored continuously by radiology nursing throughout the procedure under my direct supervision. COMPLICATIONS: None immediate. PROCEDURE: Informed written consent was obtained from the patient after a discussion of the risks, benefits and alternatives to treatment. The patient understands and consents the procedure. A timeout was performed prior to the initiation of the procedure. Ultrasound scanning was performed of the right upper abdominal  quadrant demonstrates a large, at least 6.4 x 5.1 cm, lesion/mass within the right lobe liver correlating with the lesion seen on image 25, series 2 of preceding abdominal CT. The procedure was planned. The right upper abdominal quadrant was prepped and draped in the usual sterile fashion. The overlying soft tissues were anesthetized with 1% lidocaine with epinephrine. A 17 gauge, 6.8 cm co-axial needle was advanced into a peripheral aspect of the lesion. This was followed by 4 core biopsies with an 18 gauge core device under direct ultrasound guidance. The coaxial needle tract was embolized with a small amount of Gel-Foam slurry and superficial hemostasis was obtained with manual compression. Post procedural scanning was negative for definitive area of hemorrhage or additional complication. A dressing was placed. The patient tolerated the procedure well without immediate post procedural complication. IMPRESSION: Technically successful ultrasound guided core needle biopsy of indeterminate mass within the caudal aspect of the right lobe of the liver. Electronically Signed   By: Sandi Mariscal M.D.   On: 10/30/2017 17:41   Dg C-arm 1-60 Min-no Report  Result Date: 11/04/2017 Fluoroscopy was utilized by the requesting physician.  No radiographic interpretation.   US Abdomen Limited Ruq  Result Date: 11/04/2017 CLINICAL DATA:  Hepatic mass.  Pancreatic cancer. EXAM: ULTRASOUND ABDOMEN LIMITED RIGHT UPPER QUADRANT COMPARISON:  CT of the abdomen and pelvis 10/29/2017 FINDINGS: Gallbladder: Gallbladder wall is focally thickened, up to 5.8 mm. No sonographic Murphy's sign or stones. Common bile duct: Diameter: 4.4 mm Liver: The liver is diffusely heterogeneous. Numerous discrete lesions are identified. In the left hepatic lobe, a mass is 5.2 x 4.1 x 5.3 cm. In the right hepatic lobe, a mass is 2.6 x 2.6 x 2.3 cm. A third measured lesion is 5.0 x 4.2 x 5.3 cm.  Portal vein is patent on color Doppler imaging with normal  direction of blood flow towards the liver. Additional: Note is made of a small amount of right perinephric fluid. An intrarenal calculus on the right measures approximately 7 mm. IMPRESSION: 1. Irregularly thickened gallbladder wall. 2. Numerous liver metastases, measuring up to 5.3 cm. 3. Nephrolithiasis. 4. Right perinephric fluid. Electronically Signed   By: Nolon Nations M.D.   On: 11/04/2017 17:06      IMPRESSION/PLAN: 1. 56 y.o. gentleman with Stage IV pancreatic cancer with erosion through the duodenum causing active oozing from his primary tumor. His case has been reviewed with Dr. Tammi Klippel and films personally reviewed by Dr. Tammi Klippel. We could consider a short palliative course of radiotherapy with 20 Gy to the pancreatic head mass in 5 fractions with the hopes of improving his active bleeding and palliating his intermittent pain.  We discussed the risks, benefits, short, and long term effects of radiotherapy, and the patient is interested in proceeding. I discussed the delivery and logistics of radiotherapy. Written consent is obtained and placed in the chart, a copy was provided to the patient. He will simulate today and begin treatment on 11/05/17.   In a visit lasting 70 minutes, greater than 50% of the time was spent face to face discussing also reviewing his case with his sister by phone, and coordinating the patient's care.     Carola Rhine, [facility]   Page Me

## 2017-11-05 NOTE — Addendum Note (Signed)
Encounter addended by: Hayden Pedro, PA-C on: 11/05/2017 6:17 AM  Actions taken: LOS modified, Follow-up modified

## 2017-11-05 NOTE — Progress Notes (Signed)
IP PROGRESS NOTE  Subjective:  Infuse-a-Port placement yesterday.  Also has been seen by radiation oncology and plans to start radiation therapy today with 5 days of treatment planned.  Objective: Vital signs in last 24 hours: Blood pressure 118/66, pulse 77, temperature (!) 97.5 F (36.4 C), temperature source Oral, resp. rate 16, height 5' 6"  (1.676 m), weight 172 lb (78 kg), SpO2 97 %.  Intake/Output from previous day: 11/26 0701 - 11/27 0700 In: 1050 [P.O.:240; I.V.:810] Out: 503 [Urine:503]  Physical Exam:  Patient appears fatigued, AAOx3 HEENT: Anicteric, most mucous membranes Lungs: Clear to auscultation bilaterally Cardiac: S1/S2, regular, no murmurs Abdomen: Soft, non-tender, non-distended. No voluminous ascites by physical examination Lymph nodes: No palpable lymphadenopathy Neurologic: No gross focal neurological deficits Skin: No jaundice. Musculoskeletal: Muscle wasting noted.    Lab Results: Recent Labs    11/04/17 0506 11/05/17 0528  WBC 9.1 10.9*  HGB 8.3* 8.2*  HCT 26.3* 25.9*  PLT 270 274    BMET Recent Labs    11/04/17 0506 11/05/17 0528  NA 139 139  K 3.4* 3.8  CL 107 107  CO2 27 27  GLUCOSE 120* 110*  BUN 11 13  CREATININE 0.67 0.64  CALCIUM 8.3* 8.5*    No results found for: CEA1  Studies/Results: Ct Chest W Contrast  Result Date: 11/04/2017 CLINICAL DATA:  New diagnosis of pancreatic cancer.  Nonsmoker. EXAM: CT CHEST WITH CONTRAST TECHNIQUE: Multidetector CT imaging of the chest was performed during intravenous contrast administration. CONTRAST:  74m ISOVUE-300 IOPAMIDOL (ISOVUE-300) INJECTION 61% COMPARISON:  Plain film 10/29/2017.  abdominopelvic CT 10/29/2017. FINDINGS: Cardiovascular: Aortic and branch vessel atherosclerosis. Normal heart size, without pericardial effusion. Lad coronary artery atherosclerosis. No central pulmonary embolism, on this non-dedicated study. Mediastinum/Nodes: No supraclavicular adenopathy. No  mediastinal or hilar adenopathy. Lungs/Pleura: No pleural fluid. Bibasilar scarring. No suspicious pulmonary nodule or mass. Upper Abdomen: Multifocal hepatic metastasis. Index right hepatic lobe lesion is unchanged at 5.7 cm. Normal imaged portions of the spleen, stomach, pancreas, adrenal glands, left kidney. New gallbladder wall thickening, incompletely imaged. Small volume perihepatic ascites is new. Necrotic adenopathy in the porta hepatis is again identified. Musculoskeletal: No acute osseous abnormality. IMPRESSION: 1.  No acute process or evidence of metastatic disease in the chest. 2. Hepatic and upper abdominal nodal metastasis, as before. 3. New gallbladder wall thickening. Correlate with right upper quadrant symptoms. Consider dedicated ultrasound. 4. Small volume perihepatic ascites, new. 5. Coronary artery atherosclerosis. Aortic Atherosclerosis (ICD10-I70.0). These results will be called to the ordering clinician or representative by the Radiologist Assistant, and communication documented in the PACS or zVision Dashboard. Electronically Signed   By: KAbigail MiyamotoM.D.   On: 11/04/2017 10:44   Dg C-arm 1-60 Min-no Report  Result Date: 11/04/2017 Fluoroscopy was utilized by the requesting physician.  No radiographic interpretation.   [place]dom[place] Limited Ruq  Result Date: 11/04/2017 CLINICAL DATA:  Hepatic mass.  Pancreatic cancer. EXAM: ULTRASOUND ABDOMEN LIMITED RIGHT UPPER QUADRANT COMPARISON:  CT of the abdomen and pelvis 10/29/2017 FINDINGS: Gallbladder: Gallbladder wall is focally thickened, up to 5.8 mm. No sonographic Murphy's sign or stones. Common bile duct: Diameter: 4.4 mm Liver: The liver is diffusely heterogeneous. Numerous discrete lesions are identified. In the left hepatic lobe, a mass is 5.2 x 4.1 x 5.3 cm. In the right hepatic lobe, a mass is 2.6 x 2.6 x 2.3 cm. A third measured lesion is 5.0 x 4.2 x 5.3 cm. Portal vein is patent on color Doppler imaging with  normal direction  of blood flow towards the liver. Additional: Note is made of a small amount of right perinephric fluid. An intrarenal calculus on the right measures approximately 7 mm. IMPRESSION: 1. Irregularly thickened gallbladder wall. 2. Numerous liver metastases, measuring up to 5.3 cm. 3. Nephrolithiasis. 4. Right perinephric fluid. Electronically Signed   By: Nolon Nations M.D.   On: 11/04/2017 17:06    Medications: I have reviewed the patient's current medications.  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 Additional history significant for presence of germline BRCA2 mutation with corresponding oncological history in the family. Based on the origin on the extent of the disease, patient has a stage IV pancreatic cancer which is not a curable disease at this time. With that in mind, any approach to treatment will be palliative in nature.  Current strategy of palliative systemic therapy includes initiation of systemic chemotherapy with most active regimens including FOLFIRINOX and gemcitabine/nab-paclitaxel.  Based on BRCA2 positivity, second line of therapy may be olaparib, an oral agent that targets the PARP and has shown promising activity in a variety of tumors driven by BRCA2 mutation, including ovarian, prostate, breast, and pancreatic cancers.  Additional consideration can be given to palliative radiation to the primary lesion to obtain faster hemostasis.  If no radiation is feasible, I would like to proceed with systemic chemotherapy while patient is still hospitalized, and as early as possibly on Wednesday this week.  Recommendations: --Transfer to 3W at completion of radiation therapy & will plan on starting chemotherapy with FOLFIRINOX on 11/12/17     LOS: 7 days   Ardath Sax, MD   11/05/2017, 8:56 AM

## 2017-11-05 NOTE — Progress Notes (Signed)
PROGRESS NOTE    [name]  BVA:[id number] DOB: 1961-05-10 DOA: 10/29/2017 PCP: [name], MD  Brief Narrative: 56 y.o.malewith medical history significant ofhypertension, diabetes, family history of ovarian and prostate cancer, family history positive for BRCA2 gene, hyperlipidemia who presents to the ED with a 1-1/2-week history of upper abdominal pain occasionally radiating to his back, dizziness, lightheadedness, black tarry stools, generalized weakness. Patient also endorses a 20 pound weight loss over the past 2 months with a decreased appetite.Patient had presented to his PCPs office or lab work which was obtained showed a new anemia with a hemoglobin of 8.1. He was sent to the ED where labs showed a white count of 13.5, hemoglobin of 7.8, trending down to 7.0, platelets of 356 otherwise was within normal limits. Basic metabolic profile obtained had a sodium of 133 glucose of 111 BUN of 36 creatinine of 1.17. CT abdomen and pelvis demonstrated a pancreatic mass and multiple hepatic lesions. 1u PRBCs given with improvement to hgb 7.7g/dl. EGD 11/21 demonstrated a necrotic duodenal mass with adherent clot and bleeding and erosive esophagitis. IR was consulted for biopsy of hepatic lesion, performed 11/21. Clear liquids were started, and pt has had a single melanotic stool on 11/22.Diet was successfully advanced with resolution of gross melena. Hemoglobin trended downward prompting 1u PRBC transfusion 11/25.port placed 11/04/17    Assessment & Plan:   Principal Problem:   GI bleed Active Problems:   Diabetes type 2, controlled ([place])   Hypertension   Symptomatic anemia   Acute blood loss anemia   Pancreatic mass: Per CT abd/pelvis 10/29/2017   Dehydration   Leukocytosis   Metastases to the liver [facility])   Stage IV adenocarcinoma of pancreas ([place])  Stage IV pancreatic cancer eroding into the duodenum with metastases to the liver-the patient to start radiation  treatment today for a total of 5 radiation followed by chemotherapy while in hospital.  Patient however had a port placed 11/04/2017.  Status post acute GI bleed secondary to necrotic duodenal mass eroding into the duodenum.  He received a unit of blood transfusion.  Hemoglobin remained stable.   DVT prophylaxis: Heparin Code Status full code :Family Communication none :Disposition Plan: Patient is to start radiation and chemotherapy while in the hospital.  Consultants: Radiation oncology, oncology  Procedures: Port placed 11/04/2017 Antimicrobials: None  Subjective: Feels well  Objective: Resting in bed in no acute distress no nausea vomiting diarrhea fever or chills Vitals:   11/04/17 1353 11/04/17 1946 11/05/17 0448 11/05/17 0453  BP: (!) 108/58 114/65 101/64 118/66  Pulse: 73 88 73 77  Resp:  16 17 16   Temp: 98.2 F (36.8 C) 98 F (36.7 C) 98.1 F (36.7 C) (!) 97.5 F (36.4 C)  TempSrc:  Oral Oral Oral  SpO2: 100% 93% 97% 97%  Weight:      Height:        Intake/Output Summary (Last 24 hours) at 11/05/2017 1353 Last data filed at 11/05/2017 0846 Gross per 24 hour  Intake 370 ml  Output 502 ml  Net -132 ml   Filed Weights   11/03/17 0500 11/04/17 0443 11/04/17 1106  Weight: 78.6 kg (173 lb 4.5 oz) 78.4 kg (172 lb 13.5 oz) 78 kg (172 lb)    Examination:  General exam: Appears calm and comfortable  Respiratory system: Clear to auscultation. Respiratory effort normal. Cardiovascular system: S1 & S2 heard, RRR. No JVD, murmurs, rubs, gallops or clicks. No pedal edema. Gastrointestinal system: Abdomen is nondistended, soft and  nontender. No organomegaly or masses felt. Normal bowel sounds heard. Central nervous system: Alert and oriented. No focal neurological deficits. Extremities: Symmetric 5 x 5 power. Skin: No rashes, lesions or ulcers Psychiatry: Judgement and insight appear normal. Mood & affect appropriate.     Data Reviewed: I have personally reviewed  following labs and imaging studies  CBC: Recent Labs  Lab 11/01/17 0439 11/02/17 0459 11/03/17 0610 11/04/17 0506 11/05/17 0528  WBC 7.8 8.2 7.6 9.1 10.9*  NEUTROABS 6.4 6.5 6.1 7.4 9.5*  HGB 7.4* 7.4* 7.1* 8.3* 8.2*  HCT 23.1* 23.4* 22.2* 26.3* 25.9*  MCV 84.0 84.2 83.8 83.5 83.5  PLT 239 264 262 270 355   Basic Metabolic Panel: Recent Labs  Lab 10/29/17 1818  11/01/17 0439 11/02/17 0459 11/03/17 0610 11/04/17 0506 11/05/17 0528  NA  --    < > 139 140 138 139 139  K  --    < > 3.9 3.5 3.4* 3.4* 3.8  CL  --    < > 111 110 109 107 107  CO2  --    < > 22 23 23 27 27   GLUCOSE  --    < > 104* 114* 128* 120* 110*  BUN  --    < > 6 6 8 11 13   CREATININE  --    < > 0.75 0.64 0.63 0.67 0.64  CALCIUM  --    < > 8.3* 8.3* 8.1* 8.3* 8.5*  MG 2.0  --   --   --   --   --  1.8   < > = values in this interval not displayed.   GFR: Estimated Creatinine Clearance: 102.6 mL/min (by C-G formula based on SCr of 0.64 mg/dL). Liver Function Tests: Recent Labs  Lab 10/30/17 0921 10/31/17 0511 11/01/17 0439 11/02/17 0459 11/03/17 0610  AST 21 22 23 30 29   ALT 18 18 15* 20 23  ALKPHOS 93 92 92 111 102  BILITOT 1.0 0.7 0.6 0.6 0.7  PROT 6.2* 6.0* 5.7* 5.6* 5.4*  ALBUMIN 2.8* 2.6* 2.5* 2.5* 2.4*   No results for input(s): LIPASE, AMYLASE in the last 168 hours. No results for input(s): AMMONIA in the last 168 hours. Coagulation Profile: Recent Labs  Lab 10/30/17 0106  INR 1.09   Cardiac Enzymes: No results for input(s): CKTOTAL, CKMB, CKMBINDEX, TROPONINI in the last 168 hours. BNP (last 3 results) No results for input(s): PROBNP in the last 8760 hours. HbA1C: No results for input(s): HGBA1C in the last 72 hours. CBG: Recent Labs  Lab 11/04/17 1302 11/04/17 1654 11/04/17 2049 11/05/17 0740 11/05/17 1203  GLUCAP 115* 136* 159* 102* 108*   Lipid Profile: No results for input(s): CHOL, HDL, LDLCALC, TRIG, CHOLHDL, LDLDIRECT in the last 72 hours. Thyroid Function  Tests: No results for input(s): TSH, T4TOTAL, FREET4, T3FREE, THYROIDAB in the last 72 hours. Anemia Panel: No results for input(s): VITAMINB12, FOLATE, FERRITIN, TIBC, IRON, RETICCTPCT in the last 72 hours. Sepsis Labs: No results for input(s): PROCALCITON, LATICACIDVEN in the last 168 hours.  Recent Results (from the past 240 hour(s))  Culture, Urine     Status: None   Collection Time: 10/30/17  4:34 AM  Result Value Ref Range Status   Specimen Description URINE, CLEAN CATCH  Final   Special Requests NONE  Final   Culture   Final    NO GROWTH Performed at [facility], [address], [place], [place] [address]    Report Status 10/31/2017 FINAL  Final         Radiology Studies: Ct Chest W Contrast  Result Date: 11/04/2017 CLINICAL DATA:  New diagnosis of pancreatic cancer.  Nonsmoker. EXAM: CT CHEST WITH CONTRAST TECHNIQUE: Multidetector CT imaging of the chest was performed during intravenous contrast administration. CONTRAST:  47m ISOVUE-300 IOPAMIDOL (ISOVUE-300) INJECTION 61% COMPARISON:  Plain film 10/29/2017.  abdominopelvic CT 10/29/2017. FINDINGS: Cardiovascular: Aortic and branch vessel atherosclerosis. Normal heart size, without pericardial effusion. Lad coronary artery atherosclerosis. No central pulmonary embolism, on this non-dedicated study. Mediastinum/Nodes: No supraclavicular adenopathy. No mediastinal or hilar adenopathy. Lungs/Pleura: No pleural fluid. Bibasilar scarring. No suspicious pulmonary nodule or mass. Upper Abdomen: Multifocal hepatic metastasis. Index right hepatic lobe lesion is unchanged at 5.7 cm. Normal imaged portions of the spleen, stomach, pancreas, adrenal glands, left kidney. New gallbladder wall thickening, incompletely imaged. Small volume perihepatic ascites is new. Necrotic adenopathy in the porta hepatis is again identified. Musculoskeletal: No acute osseous abnormality. IMPRESSION: 1.  No acute process or evidence of metastatic disease  in the chest. 2. Hepatic and upper abdominal nodal metastasis, as before. 3. New gallbladder wall thickening. Correlate with right upper quadrant symptoms. Consider dedicated ultrasound. 4. Small volume perihepatic ascites, new. 5. Coronary artery atherosclerosis. Aortic Atherosclerosis (ICD10-I70.0). These results will be called to the ordering clinician or representative by the Radiologist Assistant, and communication documented in the PACS or zVision Dashboard. Electronically Signed   By: KAbigail MiyamotoM.D.   On: 11/04/2017 10:44   Dg C-arm 1-60 Min-no Report  Result Date: 11/04/2017 Fluoroscopy was utilized by the requesting physician.  No radiographic interpretation.   [place]domen Limited Ruq  Result Date: 11/04/2017 CLINICAL DATA:  Hepatic mass.  Pancreatic cancer. EXAM: ULTRASOUND ABDOMEN LIMITED RIGHT UPPER QUADRANT COMPARISON:  CT of the abdomen and pelvis 10/29/2017 FINDINGS: Gallbladder: Gallbladder wall is focally thickened, up to 5.8 mm. No sonographic Murphy's sign or stones. Common bile duct: Diameter: 4.4 mm Liver: The liver is diffusely heterogeneous. Numerous discrete lesions are identified. In the left hepatic lobe, a mass is 5.2 x 4.1 x 5.3 cm. In the right hepatic lobe, a mass is 2.6 x 2.6 x 2.3 cm. A third measured lesion is 5.0 x 4.2 x 5.3 cm. Portal vein is patent on color Doppler imaging with normal direction of blood flow towards the liver. Additional: Note is made of a small amount of right perinephric fluid. An intrarenal calculus on the right measures approximately 7 mm. IMPRESSION: 1. Irregularly thickened gallbladder wall. 2. Numerous liver metastases, measuring up to 5.3 cm. 3. Nephrolithiasis. 4. Right perinephric fluid. Electronically Signed   By: ENolon NationsM.D.   On: 11/04/2017 17:06        Scheduled Meds: . heparin 6000 unit irrigation   Irrigation Once  . insulin aspart  0-5 Units Subcutaneous QHS  . insulin aspart  0-9 Units Subcutaneous TID WC  .  multivitamin with minerals  1 tablet Oral Daily  . pantoprazole  40 mg Intravenous Q12H   Continuous Infusions:   LOS: 7 days      EGeorgette Shell MD [facility]  If 7PM-7AM, please contact night-coverage [email or web address] Password [email or web address] 11/05/2017, 1:53 PM

## 2017-11-05 NOTE — Progress Notes (Signed)
Incisions clean, port accessed. Minimal pain at sites. -follow up as needed

## 2017-11-06 ENCOUNTER — Ambulatory Visit
Admit: 2017-11-06 | Discharge: 2017-11-06 | Disposition: A | Discharge status: Home / Self Care | Payer: BLUE CROSS/BLUE SHIELD | Attending: Radiation Oncology | Admitting: Radiation Oncology

## 2017-11-06 ENCOUNTER — Other Ambulatory Visit: Payer: Self-pay

## 2017-11-06 ENCOUNTER — Telehealth: Payer: Self-pay

## 2017-11-06 DIAGNOSIS — C259 Malignant neoplasm of pancreas, unspecified: Secondary | ICD-10-CM

## 2017-11-06 LAB — CBC WITH DIFFERENTIAL/PLATELET
Basophils Absolute: 0 10*3/uL (ref 0.0–0.1)
Basophils Relative: 0 %
EOS ABS: 0.2 10*3/uL (ref 0.0–0.7)
EOS PCT: 2 %
HCT: 25.3 % — ABNORMAL LOW (ref 39.0–52.0)
Hemoglobin: 7.9 g/dL — ABNORMAL LOW (ref 13.0–17.0)
LYMPHS ABS: 0.7 10*3/uL (ref 0.7–4.0)
LYMPHS PCT: 8 %
MCH: 26.3 pg (ref 26.0–34.0)
MCHC: 31.2 g/dL (ref 30.0–36.0)
MCV: 84.3 fL (ref 78.0–100.0)
MONOS PCT: 7 %
Monocytes Absolute: 0.5 10*3/uL (ref 0.1–1.0)
Neutro Abs: 6.8 10*3/uL (ref 1.7–7.7)
Neutrophils Relative %: 83 %
PLATELETS: 246 10*3/uL (ref 150–400)
RBC: 3 MIL/uL — ABNORMAL LOW (ref 4.22–5.81)
RDW: 15.2 % (ref 11.5–15.5)
WBC: 8.2 10*3/uL (ref 4.0–10.5)

## 2017-11-06 LAB — GLUCOSE, CAPILLARY: GLUCOSE-CAPILLARY: 100 mg/dL — AB (ref 65–99)

## 2017-11-06 MED ORDER — ENSURE ENLIVE PO LIQD
237.0000 mL | Freq: Once | ORAL | Status: AC
  Administered 2017-11-06: 237 mL via ORAL

## 2017-11-06 MED ORDER — LISINOPRIL-HYDROCHLOROTHIAZIDE 10-12.5 MG PO TABS: 1.0000 | ORAL_TABLET | Freq: Every day | ORAL | 1 refills | Status: DC

## 2017-11-06 MED ORDER — HEPARIN SOD (PORK) LOCK FLUSH 100 UNIT/ML IV SOLN
500.0000 [IU] | Freq: Once | INTRAVENOUS | Status: AC
  Administered 2017-11-06: 500 [IU] via INTRAVENOUS
  Filled 2017-11-06: qty 5

## 2017-11-06 NOTE — Discharge Summary (Signed)
Physician Discharge Summary  [name] ZOX:[id number] DOB: May 29, 1961 DOA: 10/29/2017  PCP: [name], MD  Admit date: 10/29/2017 Discharge date: 11/06/2017  Admitted From:home Disposition home Recommendations for Outpatient Follow-up:  1. Follow up with PCP in 1-2 weeks 2. Please obtain BMP/CBC in one week   Home Health:none Equipment/Devices none Discharge Condition:stable CODE STATUS full Diet recommendation:  Diabetic diet Brief/Interim Summary:56 y.o.malewith medical history significant ofhypertension, diabetes, family history of ovarian and prostate cancer, family history positive for BRCA2 gene, hyperlipidemia who presents to the ED with a 1-1/2-week history of upper abdominal pain occasionally radiating to his back, dizziness, lightheadedness, black tarry stools, generalized weakness. Patient also endorses a 20 pound weight loss over the past 2 months with a decreased appetite.Patient had presented to his PCPs office or lab work which was obtained showed a new anemia with a hemoglobin of 8.1. He was sent to the ED where labs showed a white count of 13.5, hemoglobin of 7.8, trending down to 7.0, platelets of 356 otherwise was within normal limits. Basic metabolic profile obtained had a sodium of 133 glucose of 111 BUN of 36 creatinine of 1.17. CT abdomen and pelvis demonstrated a pancreatic mass and multiple hepatic lesions. 1u PRBCs given with improvement to hgb 7.7g/dl. EGD 11/21 demonstrated a necrotic duodenal mass with adherent clot and bleeding and erosive esophagitis. IR was consulted for biopsy of hepatic lesion, performed 11/21. Clear liquids were started, and pt has had a single melanotic stool on 11/22.Diet was successfully advanced with resolution of gross melena. Hemoglobin trended downward prompting 1u PRBC transfusion 11/25.port placed 11/04/17     Discharge Diagnoses:  Principal Problem:   GI bleed Active Problems:   Diabetes type 2,  controlled ([place])   Hypertension   Symptomatic anemia   Acute blood loss anemia   Pancreatic mass: Per CT abd/pelvis 10/29/2017   Dehydration   Leukocytosis   Metastases to the liver [facility])   Stage IV adenocarcinoma of pancreas ([place])  Stage IV pancreatic cancer eroding into the duodenum with metastases to the liver-the patient to start radiation treatment today for a total of 5 radiation followed by chemotherapy while in hospital.  Patient however had a port placed 11/04/2017.  Status post acute GI bleed secondary to necrotic duodenal mass eroding into the duodenum.  He received a unit of blood transfusion.  Hemoglobin remained stable.     Discharge Instructions follow up with dr Mirian Capuchin for radiation.follow up with dr Lebron Conners for chemotherapy.   Allergies as of 11/06/2017   No Known Allergies     Medication List    STOP taking these medications   lisinopril-hydrochlorothiazide 10-12.5 MG tablet Commonly known as:  PRINZIDE,ZESTORETIC   naproxen sodium 220 MG tablet Commonly known as:  ALEVE     TAKE these medications   aspirin 325 MG tablet Take 325 mg by mouth daily.   atorvastatin 10 MG tablet Commonly known as:  LIPITOR Take 1 tablet (10 mg total) by mouth at bedtime.   blood glucose meter kit and supplies Dispense based on patient and insurance preference. Use up to four times daily as directed. (FOR ICD-9 250.00, 250.01).   fish oil-omega-3 fatty acids 1000 MG capsule Take 4 g by mouth daily.   glucose blood test strip Use to test blood sugar up to 4 times daily as directed. Dx code: E11.9   metFORMIN 500 MG tablet Commonly known as:  GLUCOPHAGE Take 1 tablet (500 mg total) by mouth 2 (two) times daily  with a meal. What changed:    how much to take  additional instructions   multivitamin-iron-minerals-folic acid chewable tablet Chew 1 tablet by mouth daily.      Follow-up Information    Kinsinger, Arta Bruce, MD Follow up.   Specialty:   General Surgery Why:  follow up if port-a-cath issue Contact information: [place] [address] [phone]        [name], MD Follow up.   Specialties:  Family Medicine, Sports Medicine Contact information: [address] [place] [address] [phone]        Perlov, Mikhail G, MD Follow up.   Specialty:  Hematology and Oncology Contact information: [place] [address] [phone]        Tyler Pita, MD Follow up.   Specialty:  Radiation Oncology Contact information: [place] [phone]          No Known Allergies   Consultations:onc,radiation onc   Procedures/Studies: Dg Chest 2 View  Result Date: 10/29/2017 CLINICAL DATA:  Hypotension. EXAM: CHEST  2 VIEW COMPARISON:  None. FINDINGS: The heart size and mediastinal contours are within normal limits. Both lungs are clear. The visualized skeletal structures are unremarkable. IMPRESSION: No active cardiopulmonary disease. Electronically Signed   By: Ashley Royalty M.D.   On: 10/29/2017 17:56   Ct Chest W Contrast  Result Date: 11/04/2017 CLINICAL DATA:  New diagnosis of pancreatic cancer.  Nonsmoker. EXAM: CT CHEST WITH CONTRAST TECHNIQUE: Multidetector CT imaging of the chest was performed during intravenous contrast administration. CONTRAST:  62m ISOVUE-300 IOPAMIDOL (ISOVUE-300) INJECTION 61% COMPARISON:  Plain film 10/29/2017.  abdominopelvic CT 10/29/2017. FINDINGS: Cardiovascular: Aortic and branch vessel atherosclerosis. Normal heart size, without pericardial effusion. Lad coronary artery atherosclerosis. No central pulmonary embolism, on this non-dedicated study. Mediastinum/Nodes: No supraclavicular adenopathy. No mediastinal or hilar adenopathy. Lungs/Pleura: No pleural fluid. Bibasilar scarring. No suspicious pulmonary nodule or mass. Upper Abdomen: Multifocal hepatic metastasis. Index right hepatic lobe  lesion is unchanged at 5.7 cm. Normal imaged portions of the spleen, stomach, pancreas, adrenal glands, left kidney. New gallbladder wall thickening, incompletely imaged. Small volume perihepatic ascites is new. Necrotic adenopathy in the porta hepatis is again identified. Musculoskeletal: No acute osseous abnormality. IMPRESSION: 1.  No acute process or evidence of metastatic disease in the chest. 2. Hepatic and upper abdominal nodal metastasis, as before. 3. New gallbladder wall thickening. Correlate with right upper quadrant symptoms. Consider dedicated ultrasound. 4. Small volume perihepatic ascites, new. 5. Coronary artery atherosclerosis. Aortic Atherosclerosis (ICD10-I70.0). These results will be called to the ordering clinician or representative by the Radiologist Assistant, and communication documented in the PACS or zVision Dashboard. Electronically Signed   By: KAbigail MiyamotoM.D.   On: 11/04/2017 10:44   Ct Abdomen Pelvis W Contrast  Result Date: 10/29/2017 CLINICAL DATA:  Fatigue for 1 week and epigastric pain with weight loss EXAM: CT ABDOMEN AND PELVIS WITH CONTRAST TECHNIQUE: Multidetector CT imaging of the abdomen and pelvis was performed using the standard protocol following bolus administration of intravenous contrast. CONTRAST:  1030mISOVUE-300 IOPAMIDOL (ISOVUE-300) INJECTION 61% COMPARISON:  None. FINDINGS: Lower chest: Lung bases are free of acute infiltrate or sizable effusion. Hepatobiliary: The gallbladder is within normal limits. The liver demonstrates multiple hypodense lesions with peripheral enhancement most consistent with metastatic disease. These do not demonstrate significant enhancement on delayed images. The largest of these in the left lobe of the liver measures approximately 5.1 cm.  The largest of these in the right lobe of the liver measures approximately 5.8 cm. No biliary ductal dilatation is seen. Pancreas: The body and tail of the pancreas are within normal limits. In  the region of the pancreatic head however in the head as prominent with suggestion of underlying mass lesion. This would correspond with the changes seen in the liver. There is suggestion of mass lesion measures approximately 5 cm in greatest dimension. Some associated adjacent necrotic appearing lymph nodes are seen in the region of the celiac axis (15 mm) as well as in the periaortic region and mesentery (12 mm). Peripancreatic (2.1 cm) and gastrohepatic (11 mm) adenopathy is noted as well. An adjacent duodenal diverticulum is seen with both air and fluid within. Spleen: Normal in size without focal abnormality. Adrenals/Urinary Tract: The adrenal glands are within normal limits. The kidneys demonstrates no obstructive change. Large nonobstructing right renal stone is seen measuring approximately 10 mm. The bladder is partially distended. Stomach/Bowel: Bilateral inguinal hernias are identified with loops of small bowel within the right inguinal hernia and loops of sigmoid colon in the left inguinal hernia. No obstructive changes are seen. The appendix is within normal limits. Scattered diverticular change is noted in the colon without diverticulitis. Large duodenal diverticulum is noted adjacent to the head of the pancreas. Vascular/Lymphatic: Atherosclerotic calcifications are identified. Additionally there are changes of lymphadenopathy along the right common iliac artery measuring approximately 12 mm in short axis. Periaortic and intra-aortocaval lymph nodes are seen as well as the previously described changes in the region of the gastrohepatic ligament as well as the peripancreatic region. Some of these nodes are intimately associated with suggested pancreatic head mass. The largest of these measures approximately 2.1 cm in short axis with central necrosis. Reproductive: Prostate is unremarkable. Other: No abdominal wall hernia or abnormality. No abdominopelvic ascites. Musculoskeletal: No acute or  significant osseous findings. IMPRESSION: Changes consistent with hepatic metastatic disease and likely primary pancreatic mass in the region of the head of the pancreas. Surrounding associated lymphadenopathy is noted as described. Tissue sampling of the liver lesions is recommended. Additional workup for distant metastatic disease is recommended as well. Nonobstructing right renal stone These results were called by telephone at the time of interpretation on 10/29/2017 at 2:52 pm to [facility] , who verbally acknowledged these results. Electronically Signed   By: Inez Catalina M.D.   On: 10/29/2017 14:56   Ir US Guide Bx Asp/drain  Result Date: 10/30/2017 INDICATION: Concern for metastatic pancreatic cancer. Please perform liver lesion biopsy for tissue diagnostic purposes. EXAM: ULTRASOUND GUIDED LIVER LESION BIOPSY COMPARISON:  CT abdomen and pelvis - 11/20/202=18 MEDICATIONS: None ANESTHESIA/SEDATION: Fentanyl 75 mcg IV; Versed 2 mg IV Total Moderate Sedation time: 11 minutes. The patient's level of consciousness and vital signs were monitored continuously by radiology nursing throughout the procedure under my direct supervision. COMPLICATIONS: None immediate. PROCEDURE: Informed written consent was obtained from the patient after a discussion of the risks, benefits and alternatives to treatment. The patient understands and consents the procedure. A timeout was performed prior to the initiation of the procedure. Ultrasound scanning was performed of the right upper abdominal quadrant demonstrates a large, at least 6.4 x 5.1 cm, lesion/mass within the right lobe liver correlating with the lesion seen on image 25, series 2 of preceding abdominal CT. The procedure was planned. The right upper abdominal quadrant was prepped and draped in the usual sterile fashion. The overlying soft tissues were anesthetized with 1% lidocaine  with epinephrine. A 17 gauge, 6.8 cm co-axial needle was advanced into a  peripheral aspect of the lesion. This was followed by 4 core biopsies with an 18 gauge core device under direct ultrasound guidance. The coaxial needle tract was embolized with a small amount of Gel-Foam slurry and superficial hemostasis was obtained with manual compression. Post procedural scanning was negative for definitive area of hemorrhage or additional complication. A dressing was placed. The patient tolerated the procedure well without immediate post procedural complication. IMPRESSION: Technically successful ultrasound guided core needle biopsy of indeterminate mass within the caudal aspect of the right lobe of the liver. Electronically Signed   By: Sandi Mariscal M.D.   On: 10/30/2017 17:41   Dg C-arm 1-60 Min-no Report  Result Date: 11/04/2017 Fluoroscopy was utilized by the requesting physician.  No radiographic interpretation.   US Abdomen Limited Ruq  Result Date: 11/04/2017 CLINICAL DATA:  Hepatic mass.  Pancreatic cancer. EXAM: ULTRASOUND ABDOMEN LIMITED RIGHT UPPER QUADRANT COMPARISON:  CT of the abdomen and pelvis 10/29/2017 FINDINGS: Gallbladder: Gallbladder wall is focally thickened, up to 5.8 mm. No sonographic Murphy's sign or stones. Common bile duct: Diameter: 4.4 mm Liver: The liver is diffusely heterogeneous. Numerous discrete lesions are identified. In the left hepatic lobe, a mass is 5.2 x 4.1 x 5.3 cm. In the right hepatic lobe, a mass is 2.6 x 2.6 x 2.3 cm. A third measured lesion is 5.0 x 4.2 x 5.3 cm. Portal vein is patent on color Doppler imaging with normal direction of blood flow towards the liver. Additional: Note is made of a small amount of right perinephric fluid. An intrarenal calculus on the right measures approximately 7 mm. IMPRESSION: 1. Irregularly thickened gallbladder wall. 2. Numerous liver metastases, measuring up to 5.3 cm. 3. Nephrolithiasis. 4. Right perinephric fluid. Electronically Signed   By: Nolon Nations M.D.   On: 11/04/2017 17:06    (Echo,  Carotid, EGD, Colonoscopy, ERCP)    Subjective:   Discharge Exam: Vitals:   11/05/17 2126 11/06/17 0506  BP: 117/60 108/60  Pulse: 92 63  Resp: 18 16  Temp: 98.9 F (37.2 C) 98 F (36.7 C)  SpO2: 98% 96%   Vitals:   11/05/17 1557 11/05/17 2126 11/06/17 0019 11/06/17 0506  BP: 114/65 117/60  108/60  Pulse: 92 92  63  Resp: 18 18  16   Temp: 99.8 F (37.7 C) 98.9 F (37.2 C)  98 F (36.7 C)  TempSrc: Oral Oral  Oral  SpO2:  98%  96%  Weight:   77.9 kg (171 lb 11.8 oz)   Height:        General: Pt is alert, awake, not in acute distress Cardiovascular: RRR, S1/S2 +, no rubs, no gallops Respiratory: CTA bilaterally, no wheezing, no rhonchi Abdominal: Soft, NT, ND, bowel sounds + Extremities: no edema, no cyanosis    The results of significant diagnostics from this hospitalization (including imaging, microbiology, ancillary and laboratory) are listed below for reference.     Microbiology: Recent Results (from the past 240 hour(s))  Culture, Urine     Status: None   Collection Time: 10/30/17  4:34 AM  Result Value Ref Range Status   Specimen Description URINE, CLEAN CATCH  Final   Special Requests NONE  Final   Culture   Final    NO GROWTH Performed at [facility], [address], [place], [place] [address]    Report Status 10/31/2017 FINAL  Final     Labs: BNP (  last 3 results) No results for input(s): BNP in the last 8760 hours. Basic Metabolic Panel: Recent Labs  Lab 11/01/17 0439 11/02/17 0459 11/03/17 0610 11/04/17 0506 11/05/17 0528  NA 139 140 138 139 139  K 3.9 3.5 3.4* 3.4* 3.8  CL 111 110 109 107 107  CO2 22 23 23 27 27   GLUCOSE 104* 114* 128* 120* 110*  BUN 6 6 8 11 13   CREATININE 0.75 0.64 0.63 0.67 0.64  CALCIUM 8.3* 8.3* 8.1* 8.3* 8.5*  MG  --   --   --   --  1.8   Liver Function Tests: Recent Labs  Lab 10/31/17 0511 11/01/17 0439 11/02/17 0459 11/03/17 0610  AST 22 23 30 29   ALT 18 15* 20 23  ALKPHOS 92 92 111 102   BILITOT 0.7 0.6 0.6 0.7  PROT 6.0* 5.7* 5.6* 5.4*  ALBUMIN 2.6* 2.5* 2.5* 2.4*   No results for input(s): LIPASE, AMYLASE in the last 168 hours. No results for input(s): AMMONIA in the last 168 hours. CBC: Recent Labs  Lab 11/02/17 0459 11/03/17 0610 11/04/17 0506 11/05/17 0528 11/06/17 0620  WBC 8.2 7.6 9.1 10.9* 8.2  NEUTROABS 6.5 6.1 7.4 9.5* 6.8  HGB 7.4* 7.1* 8.3* 8.2* 7.9*  HCT 23.4* 22.2* 26.3* 25.9* 25.3*  MCV 84.2 83.8 83.5 83.5 84.3  PLT 264 262 270 274 246   Cardiac Enzymes: No results for input(s): CKTOTAL, CKMB, CKMBINDEX, TROPONINI in the last 168 hours. BNP: Invalid input(s): POCBNP CBG: Recent Labs  Lab 11/05/17 0740 11/05/17 1203 11/05/17 1706 11/05/17 1957 11/06/17 0731  GLUCAP 102* 108* 131* 164* 100*   D-Dimer No results for input(s): DDIMER in the last 72 hours. Hgb A1c No results for input(s): HGBA1C in the last 72 hours. Lipid Profile No results for input(s): CHOL, HDL, LDLCALC, TRIG, CHOLHDL, LDLDIRECT in the last 72 hours. Thyroid function studies No results for input(s): TSH, T4TOTAL, T3FREE, THYROIDAB in the last 72 hours.  Invalid input(s): FREET3 Anemia work up No results for input(s): VITAMINB12, FOLATE, FERRITIN, TIBC, IRON, RETICCTPCT in the last 72 hours. Urinalysis    Component Value Date/Time   COLORURINE STRAW (A) 10/30/2017 [place] 10/30/2017 0434   LABSPEC 1.012 10/30/2017 0434   PHURINE 5.0 10/30/2017 [place] 10/30/2017 0434   HGBUR NEGATIVE 10/30/2017 0434   BILIRUBINUR NEGATIVE 10/30/2017 0434   KETONESUR 5 (A) 10/30/2017 0434   PROTEINUR NEGATIVE 10/30/2017 0434   NITRITE NEGATIVE 10/30/2017 0434   LEUKOCYTESUR NEGATIVE 10/30/2017 0434   Sepsis Labs Invalid input(s): PROCALCITONIN,  WBC,  LACTICIDVEN Microbiology Recent Results (from the past 240 hour(s))  Culture, Urine     Status: None   Collection Time: 10/30/17  4:34 AM  Result Value Ref Range Status   Specimen  Description URINE, CLEAN CATCH  Final   Special Requests NONE  Final   Culture   Final    NO GROWTH Performed at [facility], [place] [address], [place], [place] [address]    Report Status 10/31/2017 FINAL  Final     Time coordinating discharge: Over 30 minutes  SIGNED:   Georgette Shell, MD  [facility] 11/06/2017, 3:02 PM Pager   If 7PM-7AM, please contact night-coverage [email or web address] Password [email or web address]

## 2017-11-06 NOTE — Progress Notes (Deleted)
PROGRESS NOTE    [name]  XYB:[id number] DOB: 1961/10/09 DOA: 10/29/2017 PCP: [name], MD  Brief Narrative:  56 y.o.malewith medical history significant ofhypertension, diabetes, family history of ovarian and prostate cancer, family history positive for BRCA2 gene, hyperlipidemia who presents to the ED with a 1-1/2-week history of upper abdominal pain occasionally radiating to his back, dizziness, lightheadedness, black tarry stools, generalized weakness. Patient also endorses a 20 pound weight loss over the past 2 months with a decreased appetite.Patient had presented to his PCPs office or lab work which was obtained showed a new anemia with a hemoglobin of 8.1. He was sent to the ED where labs showed a white count of 13.5, hemoglobin of 7.8, trending down to 7.0, platelets of 356 otherwise was within normal limits. Basic metabolic profile obtained had a sodium of 133 glucose of 111 BUN of 36 creatinine of 1.17. CT abdomen and pelvis demonstrated a pancreatic mass and multiple hepatic lesions. 1u PRBCs given with improvement to hgb 7.7g/dl. EGD 11/21 demonstrated a necrotic duodenal mass with adherent clot and bleeding and erosive esophagitis. IR was consulted for biopsy of hepatic lesion, performed 11/21. Clear liquids were started, and pt has had a single melanotic stool on 11/22.Diet was successfully advanced with resolution of gross melena. Hemoglobin trended downward prompting 1u PRBC transfusion 11/25.port placed 11/04/17    Assessment & Plan:   Principal Problem:   GI bleed Active Problems:   Diabetes type 2, controlled ([place])   Hypertension   Symptomatic anemia   Acute blood loss anemia   Pancreatic mass: Per CT abd/pelvis 10/29/2017   Dehydration   Leukocytosis   Metastases to the liver [facility])   Stage IV adenocarcinoma of pancreas ([place])  Stage IV pancreatic cancer eroding into the duodenum with metastases to the liver-the patient to start radiation  treatment today for a total of 5 radiation followed by chemotherapy while in hospital.  Patient had a port placed 11/04/2017.  Status post acute GI bleed secondary to necrotic duodenal mass eroding into the duodenum.  He received a unit of blood transfusion.  Hemoglobin remained stable.     DVT prophylaxis heparin code status full code :Family Communication: None Disposition Plan: Plan is to finish his radiation treatment.  His first radiation treatment started 11/05/2017.  He is going to get the second 1 today.  And he will have 3 more sessions.  After that, oncology is planning to start him on chemotherapy.  So it looks like he will be in the hospital  in order to start the chemotherapy.  Consultants: Oncology, interventional radiology, radiation oncology Procedures: Port placed 11/04/2017 antimicrobials: None Subjective: Feels well had the first radiation yesterday no nausea vomiting diarrhea pain at this time.  No blood in the stool no coughing.    Objective: Resting in bed in no acute distress. Vitals:   11/05/17 1557 11/05/17 2126 11/06/17 0019 11/06/17 0506  BP: 114/65 117/60  108/60  Pulse: 92 92  63  Resp: 18 18  16   Temp: 99.8 F (37.7 C) 98.9 F (37.2 C)  98 F (36.7 C)  TempSrc: Oral Oral  Oral  SpO2:  98%  96%  Weight:   77.9 kg (171 lb 11.8 oz)   Height:       No intake or output data in the 24 hours ending 11/06/17 1226 Filed Weights   11/04/17 0443 11/04/17 1106 11/06/17 0019  Weight: 78.4 kg (172 lb 13.5 oz) 78 kg (172 lb) 77.9 kg (171  lb 11.8 oz)    Examination:  General exam: Appears calm and comfortable  Respiratory system: Clear to auscultation. Respiratory effort normal. Cardiovascular system: S1 & S2 heard, RRR. No JVD, murmurs, rubs, gallops or clicks. No pedal edema. Gastrointestinal system: Abdomen is nondistended, soft and nontender. No organomegaly or masses felt. Normal bowel sounds heard. Central nervous system: Alert and oriented. No focal  neurological deficits. Extremities: Symmetric 5 x 5 power. Skin: No rashes, lesions or ulcers Psychiatry: Judgement and insight appear normal. Mood & affect appropriate.     Data Reviewed: I have personally reviewed following labs and imaging studies  CBC: Recent Labs  Lab 11/02/17 0459 11/03/17 0610 11/04/17 0506 11/05/17 0528 11/06/17 0620  WBC 8.2 7.6 9.1 10.9* 8.2  NEUTROABS 6.5 6.1 7.4 9.5* 6.8  HGB 7.4* 7.1* 8.3* 8.2* 7.9*  HCT 23.4* 22.2* 26.3* 25.9* 25.3*  MCV 84.2 83.8 83.5 83.5 84.3  PLT 264 262 270 274 158   Basic Metabolic Panel: Recent Labs  Lab 11/01/17 0439 11/02/17 0459 11/03/17 0610 11/04/17 0506 11/05/17 0528  NA 139 140 138 139 139  K 3.9 3.5 3.4* 3.4* 3.8  CL 111 110 109 107 107  CO2 22 23 23 27 27   GLUCOSE 104* 114* 128* 120* 110*  BUN 6 6 8 11 13   CREATININE 0.75 0.64 0.63 0.67 0.64  CALCIUM 8.3* 8.3* 8.1* 8.3* 8.5*  MG  --   --   --   --  1.8   GFR: Estimated Creatinine Clearance: 102.4 mL/min (by C-G formula based on SCr of 0.64 mg/dL). Liver Function Tests: Recent Labs  Lab 10/31/17 0511 11/01/17 0439 11/02/17 0459 11/03/17 0610  AST 22 23 30 29   ALT 18 15* 20 23  ALKPHOS 92 92 111 102  BILITOT 0.7 0.6 0.6 0.7  PROT 6.0* 5.7* 5.6* 5.4*  ALBUMIN 2.6* 2.5* 2.5* 2.4*   No results for input(s): LIPASE, AMYLASE in the last 168 hours. No results for input(s): AMMONIA in the last 168 hours. Coagulation Profile: No results for input(s): INR, PROTIME in the last 168 hours. Cardiac Enzymes: No results for input(s): CKTOTAL, CKMB, CKMBINDEX, TROPONINI in the last 168 hours. BNP (last 3 results) No results for input(s): PROBNP in the last 8760 hours. HbA1C: No results for input(s): HGBA1C in the last 72 hours. CBG: Recent Labs  Lab 11/05/17 0740 11/05/17 1203 11/05/17 1706 11/05/17 1957 11/06/17 0731  GLUCAP 102* 108* 131* 164* 100*   Lipid Profile: No results for input(s): CHOL, HDL, LDLCALC, TRIG, CHOLHDL, LDLDIRECT in the  last 72 hours. Thyroid Function Tests: No results for input(s): TSH, T4TOTAL, FREET4, T3FREE, THYROIDAB in the last 72 hours. Anemia Panel: No results for input(s): VITAMINB12, FOLATE, FERRITIN, TIBC, IRON, RETICCTPCT in the last 72 hours. Sepsis Labs: No results for input(s): PROCALCITON, LATICACIDVEN in the last 168 hours.  Recent Results (from the past 240 hour(s))  Culture, Urine     Status: None   Collection Time: 10/30/17  4:34 AM  Result Value Ref Range Status   Specimen Description URINE, CLEAN CATCH  Final   Special Requests NONE  Final   Culture   Final    NO GROWTH Performed at [facility], [address], [place], [place] [address]    Report Status 10/31/2017 FINAL  Final         Radiology Studies: Dg C-arm 1-60 Min-no Report  Result Date: 11/04/2017 Fluoroscopy was utilized by the requesting physician.  No radiographic interpretation.   US Abdomen  Limited Ruq  Result Date: 11/04/2017 CLINICAL DATA:  Hepatic mass.  Pancreatic cancer. EXAM: ULTRASOUND ABDOMEN LIMITED RIGHT UPPER QUADRANT COMPARISON:  CT of the abdomen and pelvis 10/29/2017 FINDINGS: Gallbladder: Gallbladder wall is focally thickened, up to 5.8 mm. No sonographic Murphy's sign or stones. Common bile duct: Diameter: 4.4 mm Liver: The liver is diffusely heterogeneous. Numerous discrete lesions are identified. In the left hepatic lobe, a mass is 5.2 x 4.1 x 5.3 cm. In the right hepatic lobe, a mass is 2.6 x 2.6 x 2.3 cm. A third measured lesion is 5.0 x 4.2 x 5.3 cm. Portal vein is patent on color Doppler imaging with normal direction of blood flow towards the liver. Additional: Note is made of a small amount of right perinephric fluid. An intrarenal calculus on the right measures approximately 7 mm. IMPRESSION: 1. Irregularly thickened gallbladder wall. 2. Numerous liver metastases, measuring up to 5.3 cm. 3. Nephrolithiasis. 4. Right perinephric fluid. Electronically Signed   By: Nolon Nations  M.D.   On: 11/04/2017 17:06        Scheduled Meds: . heparin 6000 unit irrigation   Irrigation Once  . insulin aspart  0-5 Units Subcutaneous QHS  . insulin aspart  0-9 Units Subcutaneous TID WC  . multivitamin with minerals  1 tablet Oral Daily  . pantoprazole  40 mg Intravenous Q12H   Continuous Infusions:   LOS: 8 days     Georgette Shell, MD [facility] If 7PM-7AM, please contact night-coverage [email or web address] Password [email or web address] 11/06/2017, 12:26 PM

## 2017-11-06 NOTE — Progress Notes (Signed)
Initial Nutrition Assessment  INTERVENTION:   Provide an Ensure supplement for patient to try RD will continue to monitor for needs  NUTRITION DIAGNOSIS:   Increased nutrient needs related to cancer and cancer related treatments as evidenced by estimated needs.  GOAL:   Patient will meet greater than or equal to 90% of their needs  MONITOR:   PO intake, Supplement acceptance, Weight trends, Labs, I & O's  REASON FOR ASSESSMENT:   Malnutrition Screening Tool    ASSESSMENT:   56 y.o. male with medical history significant of hypertension, diabetes, family history of ovarian and prostate cancer, family history positive for BRCA2 gene, hyperlipidemia who presents to the ED with a 1-1/2-week history of upper abdominal pain occasionally radiating to his back, dizziness, lightheadedness, black tarry stools, generalized weakness.  Patient also endorses a 20 pound weight loss over the past 2 months with a decreased appetite.    Patient reports good appetite and eating well. PO intakes support this with 75-100% meal completion. Pt states he ate bacon and 2 boiled eggs this morning for breakfast. Pt denies any issues swallowing or chewing. Pt started radiation therapy yesterday 11/27 with plans to begin chemotherapy in the future. Encouraged pt to have a protein food with every meal or snack. Explained what protein foods are and that they will help maintain his muscle mass and strength. Pt willing to try Ensure supplements.   Per patient UBW is 180-190 lb. Per chart review, pt has lost 21 lb since 8/9 (11% wt loss x 3.5 months, significant for time frame).  Medications: Multivitamin with minerals daily, IV Protonix BID Labs reviewed: CBGs: 100-164   NUTRITION - FOCUSED PHYSICAL EXAM:  Nutrition focused physical exam shows no sign of depletion of muscle mass or body fat.  Diet Order:  Diet Carb Modified Fluid consistency: Thin; Room service appropriate? Yes  EDUCATION NEEDS:    Education needs have been addressed  Skin:  Skin Assessment: Reviewed RN Assessment  Last BM:  11/24  Height:   Ht Readings from Last 1 Encounters:  11/04/17 5' 6"  (1.676 m)    Weight:   Wt Readings from Last 1 Encounters:  11/06/17 171 lb 11.8 oz (77.9 kg)    Ideal Body Weight:  59.1 kg  BMI:  Body mass index is 27.72 kg/m.  Estimated Nutritional Needs:   Kcal:  7062-3762  Protein:  110-120g  Fluid:  2.3L/day  Clayton Bibles, MS, RD, LDN [place] [facility] Pager: [phone] After Hours Pager: [phone]

## 2017-11-06 NOTE — Progress Notes (Signed)
IP PROGRESS NOTE  Subjective:  Patient continued to receive radiation therapy, 3 treatments left.  Denies any new symptoms.  Objective: Vital signs in last 24 hours: Blood pressure 118/69, pulse 99, temperature 99.1 F (37.3 C), temperature source Oral, resp. rate 17, height _0  (1.676 m), weight 171 lb 11.8 oz (77.9 kg), SpO2 97 %.  Intake/Output from previous day: 11/27 0701 - 11/28 0700 In: 120 [P.O.:120] Out: -   Physical Exam:  Patient appears fatigued, AAOx3 HEENT: Anicteric, most mucous membranes Lungs: Clear to auscultation bilaterally Cardiac: S1/S2, regular, no murmurs Abdomen: Soft, non-tender, non-distended. No voluminous ascites by physical examination Lymph nodes: No palpable lymphadenopathy Neurologic: No gross focal neurological deficits Skin: No jaundice. Musculoskeletal: Muscle wasting noted.    Lab Results: Recent Labs    11/05/17 0528 11/06/17 0620  WBC 10.9* 8.2  HGB 8.2* 7.9*  HCT 25.9* 25.3*  PLT 274 246    BMET Recent Labs    11/04/17 0506 11/05/17 0528  NA 139 139  K 3.4* 3.8  CL 107 107  CO2 27 27  GLUCOSE 120* 110*  BUN 11 13  CREATININE 0.67 0.64  CALCIUM 8.3* 8.5*    No results found for: CEA1  Studies/Results: US Abdomen Limited Ruq  Result Date: 11/04/2017 CLINICAL DATA:  Hepatic mass.  Pancreatic cancer. EXAM: ULTRASOUND ABDOMEN LIMITED RIGHT UPPER QUADRANT COMPARISON:  CT of the abdomen and pelvis 10/29/2017 FINDINGS: Gallbladder: Gallbladder wall is focally thickened, up to 5.8 mm. No sonographic Murphy's sign or stones. Common bile duct: Diameter: 4.4 mm Liver: The liver is diffusely heterogeneous. Numerous discrete lesions are identified. In the left hepatic lobe, a mass is 5.2 x 4.1 x 5.3 cm. In the right hepatic lobe, a mass is 2.6 x 2.6 x 2.3 cm. A third measured lesion is 5.0 x 4.2 x 5.3 cm. Portal vein is patent on color Doppler imaging with normal direction of blood flow towards the liver. Additional: Note is  made of a small amount of right perinephric fluid. An intrarenal calculus on the right measures approximately 7 mm. IMPRESSION: 1. Irregularly thickened gallbladder wall. 2. Numerous liver metastases, measuring up to 5.3 cm. 3. Nephrolithiasis. 4. Right perinephric fluid. Electronically Signed   By: Nolon Nations M.D.   On: 11/04/2017 17:06    Medications: I have reviewed the patient's current medications.  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 Additional history significant for presence of germline BRCA2 mutation with corresponding oncological history in the family. Based on the origin on the extent of the disease, patient has a stage IV pancreatic cancer which is not a curable disease at this time. With that in mind, any approach to treatment will be palliative in nature.  Current strategy of palliative systemic therapy includes initiation of systemic chemotherapy with most active regimens including FOLFIRINOX and gemcitabine/nab-paclitaxel.  Based on BRCA2 positivity, second line of therapy may be olaparib, an oral agent that targets the PARP and has shown promising activity in a variety of tumors driven by BRCA2 mutation, including ovarian, prostate, breast, and pancreatic cancers.  Additional consideration can be given to palliative radiation to the primary lesion to obtain faster hemostasis.  If no radiation is feasible, I would like to proceed with systemic chemotherapy while patient is still hospitalized, and as early as possibly on Wednesday this week.  Recommendations: --Discharge from the hospital description of the primary treating service. --I will arrange  for the patient to follow-up with me on 11/12/17 with lab work and possible initiation of FOLFIRINOX systemic palliative chemotherapy later in the week     LOS: 8 days   Ardath Sax, MD   11/06/2017, 4:40  PM

## 2017-11-06 NOTE — Telephone Encounter (Signed)
High priority scheduling message sent for labs and f/u visit with Dr. Lebron Conners on 11/12/17. Message also sent that patient needs chemo infusion on 12/4 or sometime that week.

## 2017-11-06 NOTE — Progress Notes (Signed)
Patient given discharge instructions, and verbalized an understanding of all discharge instructions.  Patient agrees with discharge plan, and is being discharged in stable medical condition.  Patient given transportation via wheelchair. 

## 2017-11-07 ENCOUNTER — Ambulatory Visit
Admission: RE | Admit: 2017-11-07 | Discharge: 2017-11-07 | Disposition: A | Discharge status: Home / Self Care | Payer: BLUE CROSS/BLUE SHIELD | Source: Ambulatory Visit | Attending: Radiation Oncology | Admitting: Radiation Oncology

## 2017-11-07 ENCOUNTER — Other Ambulatory Visit: Payer: Self-pay | Admitting: Hematology and Oncology

## 2017-11-07 DIAGNOSIS — C787 Secondary malignant neoplasm of liver and intrahepatic bile duct: Secondary | ICD-10-CM

## 2017-11-07 DIAGNOSIS — Z1509 Genetic susceptibility to other malignant neoplasm: Secondary | ICD-10-CM

## 2017-11-07 DIAGNOSIS — C259 Malignant neoplasm of pancreas, unspecified: Secondary | ICD-10-CM

## 2017-11-07 DIAGNOSIS — Z510 Encounter for antineoplastic radiation therapy: Secondary | ICD-10-CM | POA: Diagnosis not present

## 2017-11-07 DIAGNOSIS — Z1501 Genetic susceptibility to malignant neoplasm of breast: Secondary | ICD-10-CM

## 2017-11-07 MED ORDER — DEXAMETHASONE 4 MG PO TABS: 8.0000 mg | ORAL_TABLET | Freq: Every day | ORAL | 5 refills | Status: AC

## 2017-11-07 MED ORDER — PROCHLORPERAZINE MALEATE 10 MG PO TABS: 10.0000 mg | ORAL_TABLET | Freq: Four times a day (QID) | ORAL | 1 refills | Status: AC | PRN

## 2017-11-07 MED ORDER — LIDOCAINE-PRILOCAINE 2.5-2.5 % EX CREA: TOPICAL_CREAM | CUTANEOUS | 3 refills | Status: AC

## 2017-11-07 MED ORDER — LORAZEPAM 1 MG PO TABS: 1.0000 mg | ORAL_TABLET | Freq: Four times a day (QID) | ORAL | 0 refills | Status: AC | PRN

## 2017-11-07 MED ORDER — ONDANSETRON HCL 8 MG PO TABS: 8.0000 mg | ORAL_TABLET | Freq: Two times a day (BID) | ORAL | 1 refills | Status: AC | PRN

## 2017-11-07 MED ORDER — LOPERAMIDE HCL 2 MG PO TABS: 2.0000 mg | ORAL_TABLET | Freq: Four times a day (QID) | ORAL | 1 refills | Status: AC | PRN

## 2017-11-08 ENCOUNTER — Ambulatory Visit
Admission: RE | Admit: 2017-11-08 | Discharge: 2017-11-08 | Disposition: A | Discharge status: Home / Self Care | Payer: BLUE CROSS/BLUE SHIELD | Source: Ambulatory Visit | Attending: Radiation Oncology | Admitting: Radiation Oncology

## 2017-11-08 DIAGNOSIS — Z510 Encounter for antineoplastic radiation therapy: Secondary | ICD-10-CM | POA: Diagnosis not present

## 2017-11-11 ENCOUNTER — Encounter: Payer: Self-pay | Admitting: Radiation Oncology

## 2017-11-11 ENCOUNTER — Ambulatory Visit
Admission: RE | Admit: 2017-11-11 | Discharge: 2017-11-11 | Disposition: A | Discharge status: Home / Self Care | Payer: BLUE CROSS/BLUE SHIELD | Source: Ambulatory Visit | Attending: Radiation Oncology | Admitting: Radiation Oncology

## 2017-11-11 DIAGNOSIS — Z510 Encounter for antineoplastic radiation therapy: Secondary | ICD-10-CM | POA: Diagnosis not present

## 2017-11-12 ENCOUNTER — Ambulatory Visit: Payer: BLUE CROSS/BLUE SHIELD

## 2017-11-13 ENCOUNTER — Encounter: Payer: Self-pay | Admitting: Pharmacist

## 2017-11-13 ENCOUNTER — Ambulatory Visit: Payer: BLUE CROSS/BLUE SHIELD

## 2017-11-13 NOTE — Progress Notes (Signed)
  Radiation Oncology         [phone]) [phone] ________________________________  Name: [name] MRN: [id number]  Date: 11/11/2017  DOB: 06-04-1961  End of Treatment Note  Diagnosis:   56 y.o. man with GI bleed from a locally advanced metastatic pancreatic head cancer    Indication for treatment:  Palliative       Radiation treatment dates:   11/05/2017 - 11/11/2017  Site/dose:   The pancreas was treated to 20 Gy in 5 fractions of 4 Gy.  Beams/energy:   3D // 15X Photon  Narrative: The patient tolerated radiation treatment relatively well.   Following his discharge from the hospital, he reported the intestinal bleeding was resolved and his black tarry stools had improved. He denied any current gastrointestinal issues. He denied any fatigue, pain, or issues with his skin during treatment.  Plan: The patient has completed radiation treatment. The patient will return to radiation oncology clinic for routine followup in one month. I advised him to call or return sooner if he has any questions or concerns related to his recovery or treatment. ________________________________  Sheral Apley. Tammi Klippel, M.D.  This document serves as a record of services personally performed by Tyler Pita, MD. It was created on his behalf by Rae Lips, a trained medical scribe. The creation of this record is based on the scribe's personal observations and the provider's statements to them. This document has been checked and approved by the attending provider.

## 2017-11-14 ENCOUNTER — Ambulatory Visit: Payer: BLUE CROSS/BLUE SHIELD

## 2017-11-14 ENCOUNTER — Inpatient Hospital Stay: Payer: BLUE CROSS/BLUE SHIELD | Admitting: Hematology and Oncology

## 2017-11-15 ENCOUNTER — Telehealth: Payer: Self-pay | Admitting: Hematology and Oncology

## 2017-11-15 ENCOUNTER — Other Ambulatory Visit: Payer: BLUE CROSS/BLUE SHIELD

## 2017-11-15 ENCOUNTER — Ambulatory Visit: Payer: BLUE CROSS/BLUE SHIELD

## 2017-11-15 ENCOUNTER — Inpatient Hospital Stay: Payer: BLUE CROSS/BLUE SHIELD | Admitting: Hematology and Oncology

## 2017-11-15 ENCOUNTER — Ambulatory Visit (HOSPITAL_BASED_OUTPATIENT_CLINIC_OR_DEPARTMENT_OTHER): Payer: BLUE CROSS/BLUE SHIELD

## 2017-11-15 ENCOUNTER — Ambulatory Visit: Payer: BLUE CROSS/BLUE SHIELD | Admitting: Hematology and Oncology

## 2017-11-15 ENCOUNTER — Encounter: Payer: Self-pay | Admitting: Hematology and Oncology

## 2017-11-15 VITALS — BP 114/68 | HR 96 | Temp 98.5°F | Resp 17 | Ht 66.0 in | Wt 168.5 lb

## 2017-11-15 DIAGNOSIS — C259 Malignant neoplasm of pancreas, unspecified: Secondary | ICD-10-CM

## 2017-11-15 DIAGNOSIS — Z1501 Genetic susceptibility to malignant neoplasm of breast: Secondary | ICD-10-CM

## 2017-11-15 DIAGNOSIS — Z1509 Genetic susceptibility to other malignant neoplasm: Secondary | ICD-10-CM

## 2017-11-15 LAB — COMPREHENSIVE METABOLIC PANEL
ALBUMIN: 2.8 g/dL — AB (ref 3.5–5.0)
ALK PHOS: 142 U/L (ref 40–150)
ALT: 30 U/L (ref 0–55)
ANION GAP: 13 meq/L — AB (ref 3–11)
AST: 39 U/L — ABNORMAL HIGH (ref 5–34)
BILIRUBIN TOTAL: 0.78 mg/dL (ref 0.20–1.20)
BUN: 10.5 mg/dL (ref 7.0–26.0)
CALCIUM: 10 mg/dL (ref 8.4–10.4)
CO2: 23 mEq/L (ref 22–29)
Chloride: 101 mEq/L (ref 98–109)
Creatinine: 0.8 mg/dL (ref 0.7–1.3)
GLUCOSE: 184 mg/dL — AB (ref 70–140)
Potassium: 4.9 mEq/L (ref 3.5–5.1)
Sodium: 137 mEq/L (ref 136–145)
TOTAL PROTEIN: 6.9 g/dL (ref 6.4–8.3)

## 2017-11-15 LAB — CBC WITH DIFFERENTIAL/PLATELET
BASO%: 0.4 % (ref 0.0–2.0)
Basophils Absolute: 0 10*3/uL (ref 0.0–0.1)
EOS%: 1 % (ref 0.0–7.0)
Eosinophils Absolute: 0.1 10*3/uL (ref 0.0–0.5)
HEMATOCRIT: 28.5 % — AB (ref 38.4–49.9)
HGB: 9.1 g/dL — ABNORMAL LOW (ref 13.0–17.1)
LYMPH#: 0.3 10*3/uL — AB (ref 0.9–3.3)
LYMPH%: 3.8 % — ABNORMAL LOW (ref 14.0–49.0)
MCH: 25.6 pg — ABNORMAL LOW (ref 27.2–33.4)
MCHC: 31.9 g/dL — AB (ref 32.0–36.0)
MCV: 80.1 fL (ref 79.3–98.0)
MONO#: 0.6 10*3/uL (ref 0.1–0.9)
MONO%: 7.2 % (ref 0.0–14.0)
NEUT#: 7.6 10*3/uL — ABNORMAL HIGH (ref 1.5–6.5)
NEUT%: 87.6 % — AB (ref 39.0–75.0)
Platelets: 385 10*3/uL (ref 140–400)
RBC: 3.56 10*6/uL — AB (ref 4.20–5.82)
RDW: 16.1 % — ABNORMAL HIGH (ref 11.0–14.6)
WBC: 8.6 10*3/uL (ref 4.0–10.3)

## 2017-11-15 NOTE — Telephone Encounter (Signed)
No additional appts to add per 12/7 los - Gave patient AVS

## 2017-11-16 LAB — CANCER ANTIGEN 19-9

## 2017-11-18 ENCOUNTER — Ambulatory Visit: Payer: BLUE CROSS/BLUE SHIELD

## 2017-11-19 ENCOUNTER — Telehealth: Payer: Self-pay | Admitting: Hematology and Oncology

## 2017-11-19 NOTE — Telephone Encounter (Signed)
11/19/2017 @ 9:58 am called patient @ [phone] to inform him that his Disability forms were filled out and would be ready for pick-up at front desk reception area.

## 2017-11-27 ENCOUNTER — Ambulatory Visit: Payer: BLUE CROSS/BLUE SHIELD | Admitting: Nurse Practitioner

## 2017-12-12 ENCOUNTER — Ambulatory Visit: Payer: Self-pay | Admitting: Urology

## 2017-12-15 NOTE — Assessment & Plan Note (Signed)
57 y.o. 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  Treatment options available are palliative in nature, due to his previous performance status, I was willing to offer him aggressive approach with FOLFIRINOX systemic therapy, but patient does not seem to be interested in not.  He clearly understands that expected survival is remarkably short with metastatic pancreatic cancer in the absence of treatment.  He understands that in treatment arm, patient have survived for a longer time.  He is very apprehensive about potential side effects of systemic chemotherapy.  At this time, he is not willing to proceed with any treatment.  Plan: -- No systemic chemotherapy based on patient's wishes --Consult hospice service for enrollment as his expected survival is clearly shorter than 6 months and most likely is measured in weeks.

## 2017-12-15 NOTE — Progress Notes (Signed)
[facility] Follow-up Visit:  Assessment: Stage IV adenocarcinoma of pancreas ([place]) 57 y.o. 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  Treatment options available are palliative in nature, due to his previous performance status, I was willing to offer him aggressive approach with FOLFIRINOX systemic therapy, but patient does not seem to be interested in not.  He clearly understands that expected survival is remarkably short with metastatic pancreatic cancer in the absence of treatment.  He understands that in treatment arm, patient have survived for a longer time.  He is very apprehensive about potential side effects of systemic chemotherapy.  At this time, he is not willing to proceed with any treatment.  Plan: -- No systemic chemotherapy based on patient's wishes --Consult hospice service for enrollment as his expected survival is clearly shorter than 6 months and most likely is measured in weeks.  Voice recognition software was used and creation of this note. Despite my best effort at editing the text, some misspelling/errors may have occurred.  Orders Placed This Encounter  Procedures  . Ambulatory referral to Hospice    Referral Priority:   Routine    Referral Type:   Consultation    Referral Reason:   Specialty Services Required    Requested Specialty:   Hospice Services    Number of Visits Requested:   1    Cancer Staging No matching staging information was found for the patient.  All questions were answered.  . The patient knows to call the clinic with any problems, questions or concerns.  This note was electronically signed.    History of Presenting Illness [name] 57 y.o. presenting to the [facility] for diagnosis of recently discovered widely metastatic  pancreatic adenocarcinoma and patient positive for germline Heterozygous BRCA2 mutation.  Patient has been  formally evaluated during his recent hospital stay.  He presents to the clinic today to discuss possible initiation of systemic palliative chemotherapy with FOLFIRINOX.  Patient denies any chest pain shortness of breath.  Continues to have abdominal discomfort which is currently reasonably treated with oral pain medications.  During our last visit in the hospital, patient appeared to be interested in palliative systemic chemotherapy that I have offered as a means to extend his life.  At this time, he appears to have changed his mind.  He no longer wishes to proceed with a systemic chemotherapy understanding that the expected survival with untreated metastatic pancreatic cancer is usually very short and likely will be less than 3 months in his case.  Most of his decision is based on the family experience with his sister who has passed away from metastatic ovarian cancer not so long ago and who has experienced multiple lines of systemic chemotherapy with significant amount of toxicity.  I have offered him options of less toxic systemic chemotherapy such as gemcitabine with albumin-bound paclitaxel, but he does not seem to be very interested in those options at this time.  Medical History: Past Medical History:  Diagnosis Date  . BRCA2 positive   . Diabetes mellitus 2008  . Family history of BRCA2 gene positive   . Family history of ovarian cancer   . Family history of prostate cancer   . Fatty liver 2008   Fatty liver, saw GI    . Hyperlipidemia   . Hypertension     Surgical History: Past Surgical History:  Procedure Laterality Date  . ESOPHAGOGASTRODUODENOSCOPY (EGD) WITH PROPOFOL  N/A 10/30/2017   Procedure: ESOPHAGOGASTRODUODENOSCOPY (EGD) WITH PROPOFOL;  Surgeon: Mauri Pole, MD;  Location: [facility];  Service: Endoscopy;  Laterality: N/A;  . IR US GUIDE BX ASP/DRAIN  10/30/2017  . NO PAST SURGERIES    . PORTACATH PLACEMENT N/A 11/04/2017   Procedure: INSERTION PORT-A-CATH;   Surgeon: Kinsinger, Arta Bruce, MD;  Location: [facility];  Service: General;  Laterality: N/A;    Family History: Family History  Problem Relation Age of Onset  . Diabetes Father   . Lung cancer Father        smoker  . Stroke Mother   . Ovarian cancer Sister 88  . BRCA 1/2 Sister        BRCA2 pos  . Leukemia Maternal Aunt   . Stomach cancer Maternal Grandfather   . Prostate cancer Paternal Grandfather   . Melanoma Sister   . BRCA 1/2 Sister        BRCA2 negative  . Leukemia Cousin        maternal first cousin  . Heart attack Neg Hx   . Colon cancer Neg Hx     Social History: Social History   Socioeconomic History  . Marital status: Single    Spouse name: Not on file  . Number of children: 0  . Years of education: Not on file  . Highest education level: Not on file  Social Needs  . Financial resource strain: Not on file  . Food insecurity - worry: Not on file  . Food insecurity - inability: Not on file  . Transportation needs - medical: Not on file  . Transportation needs - non-medical: Not on file  Occupational History  . Occupation: [employer]    Employer: [employer]  Tobacco Use  . Smoking status: Never Smoker  . Smokeless tobacco: Never Used  Substance and Sexual Activity  . Alcohol use: No  . Drug use: No  . Sexual activity: Not on file  Other Topics Concern  . Not on file  Social History Narrative   Lives by himself                 Allergies: No Known Allergies  Medications:  Current Outpatient Medications  Medication Sig Dispense Refill  . atorvastatin (LIPITOR) 10 MG tablet Take 1 tablet (10 mg total) by mouth at bedtime. 90 tablet 1  . blood glucose meter kit and supplies Dispense based on patient and insurance preference. Use up to four times daily as directed. (FOR ICD-9 250.00, 250.01). 1 each 0  . dexamethasone (DECADRON) 4 MG tablet Take 2 tablets (8 mg total) by mouth daily. Start the day after chemo for 2 days. 8 tablet 5  . fish  oil-omega-3 fatty acids 1000 MG capsule Take 4 g by mouth daily.     Marland Kitchen glucose blood test strip Use to test blood sugar up to 4 times daily as directed. Dx code: E11.9 400 each 3  . lidocaine-prilocaine (EMLA) cream Apply to affected area once 30 g 3  . loperamide (IMODIUM A-D) 2 MG tablet Take 1 tablet (2 mg total) by mouth 4 (four) times daily as needed. Take 2 at diarrhea onset , then 1 every 2hr until 12hrs with no BM. May take 2 every 4hrs at night. If diarrhea recurs repeat. 100 tablet 1  . LORazepam (ATIVAN) 1 MG tablet Take 1 tablet (1 mg total) by mouth every 6 (six) hours as needed (NAUSEA). 30 tablet 0  . metFORMIN (GLUCOPHAGE) 500 MG  tablet Take 1 tablet (500 mg total) by mouth 2 (two) times daily with a meal. (Patient taking differently: Take 500-1,000 mg by mouth 2 (two) times daily with a meal. 2 TABLETS IN THE AM AND 1 TABLET IN THE EVENING) 180 tablet 1  . multivitamin-iron-minerals-folic acid (CENTRUM) chewable tablet Chew 1 tablet by mouth daily.      . ondansetron (ZOFRAN) 8 MG tablet Take 1 tablet (8 mg total) by mouth 2 (two) times daily as needed for refractory nausea / vomiting. Start on day 3 after chemotherapy. 30 tablet 1  . prochlorperazine (COMPAZINE) 10 MG tablet Take 1 tablet (10 mg total) by mouth every 6 (six) hours as needed (NAUSEA). 30 tablet 1   No current facility-administered medications for this visit.     Review of Systems: Review of Systems  Constitutional: Positive for appetite change and fatigue.  Gastrointestinal: Positive for abdominal distention and abdominal pain.  All other systems reviewed and are negative.    PHYSICAL EXAMINATION Blood pressure 114/68, pulse 96, temperature 98.5 F (36.9 C), temperature source Oral, resp. rate 17, height 5' 6"  (1.676 m), weight 168 lb 8 oz (76.4 kg), SpO2 100 %.  ECOG PERFORMANCE STATUS: 3 - Symptomatic, >50% confined to bed  Physical Exam  Constitutional: He is oriented to person, place, and time.   Patient appears fatigued, no immediate distress in terms of pain or respiratory status.  HENT:  Head: Normocephalic and atraumatic.  Mouth/Throat: Oropharynx is clear and moist. No oropharyngeal exudate.  Eyes: Conjunctivae and EOM are normal. Pupils are equal, round, and reactive to light. No scleral icterus.  Neck: No thyromegaly present.  Cardiovascular: Normal rate, regular rhythm, normal heart sounds and intact distal pulses.  No murmur heard. Pulmonary/Chest: Effort normal and breath sounds normal. No respiratory distress. He has no wheezes.  Abdominal:  Abdomen is mildly distended, somewhat tender diffusely without rebound tenderness.  Normoactive bowel sounds  Musculoskeletal: He exhibits edema.  Lymphadenopathy:    He has no cervical adenopathy.  Neurological: He is alert and oriented to person, place, and time. He has normal reflexes. No cranial nerve deficit.  Skin: Skin is warm and dry. No erythema. There is pallor.     LABORATORY DATA: I have personally reviewed the data as listed: Appointment on 11/15/2017  Component Date Value Ref Range Status  . CA 19-9 11/15/2017 6,860* 0 - 35 U/mL Final   Comment: Results confirmed on dilution. Roche ECLIA methodology   . Sodium 11/15/2017 137  136 - 145 mEq/L Final  . Potassium 11/15/2017 4.9  3.5 - 5.1 mEq/L Final  . Chloride 11/15/2017 101  98 - 109 mEq/L Final  . CO2 11/15/2017 23  22 - 29 mEq/L Final  . Glucose 11/15/2017 184* 70 - 140 mg/dl Final   Glucose reference range is for nonfasting patients. Fasting glucose reference range is 70- 100.  Marland Kitchen BUN 11/15/2017 10.5  7.0 - 26.0 mg/dL Final  . Creatinine 11/15/2017 0.8  0.7 - 1.3 mg/dL Final  . Total Bilirubin 11/15/2017 0.78  0.20 - 1.20 mg/dL Final  . Alkaline Phosphatase 11/15/2017 142  40 - 150 U/L Final  . AST 11/15/2017 39* 5 - 34 U/L Final  . ALT 11/15/2017 30  0 - 55 U/L Final  . Total Protein 11/15/2017 6.9  6.4 - 8.3 g/dL Final  . Albumin 11/15/2017 2.8* 3.5 -  5.0 g/dL Final  . Calcium 11/15/2017 10.0  8.4 - 10.4 mg/dL Final  . Anion Gap 11/15/2017 13* 3 -  11 mEq/L Final  . EGFR 11/15/2017 >60  >60 ml/min/1.73 m2 Final   eGFR is calculated using the CKD-EPI Creatinine Equation (2009)  . WBC 11/15/2017 8.6  4.0 - 10.3 10e3/uL Final  . NEUT# 11/15/2017 7.6* 1.5 - 6.5 10e3/uL Final  . HGB 11/15/2017 9.1* 13.0 - 17.1 g/dL Final  . HCT 11/15/2017 28.5* 38.4 - 49.9 % Final  . Platelets 11/15/2017 385  140 - 400 10e3/uL Final  . MCV 11/15/2017 80.1  79.3 - 98.0 fL Final  . MCH 11/15/2017 25.6* 27.2 - 33.4 pg Final  . MCHC 11/15/2017 31.9* 32.0 - 36.0 g/dL Final  . RBC 11/15/2017 3.56* 4.20 - 5.82 10e6/uL Final  . RDW 11/15/2017 16.1* 11.0 - 14.6 % Final  . lymph# 11/15/2017 0.3* 0.9 - 3.3 10e3/uL Final  . MONO# 11/15/2017 0.6  0.1 - 0.9 10e3/uL Final  . Eosinophils Absolute 11/15/2017 0.1  0.0 - 0.5 10e3/uL Final  . Basophils Absolute 11/15/2017 0.0  0.0 - 0.1 10e3/uL Final  . NEUT% 11/15/2017 87.6* 39.0 - 75.0 % Final  . LYMPH% 11/15/2017 3.8* 14.0 - 49.0 % Final  . MONO% 11/15/2017 7.2  0.0 - 14.0 % Final  . EOS% 11/15/2017 1.0  0.0 - 7.0 % Final  . BASO% 11/15/2017 0.4  0.0 - 2.0 % Final       Ardath Sax, MD

## 2018-01-01 ENCOUNTER — Telehealth: Payer: Self-pay | Admitting: Family Medicine

## 2018-01-01 NOTE — Telephone Encounter (Signed)
Paperwork received for certificate of death. Mr. Knee passed on 01/24/2023 at home at 1954/04/05.  Certificate of death was completed for Iona Beard brother's funeral services. I called his sister Angie and expressed condolences.  Advised to let us know if there are any needs or if we can help in this time. None identified at present.

## 2018-01-09 ENCOUNTER — Encounter: Payer: BLUE CROSS/BLUE SHIELD | Admitting: Family Medicine

## 2018-01-10 DEATH — deceased

## 2018-01-14 ENCOUNTER — Other Ambulatory Visit: Payer: Self-pay | Admitting: Family Medicine

## 2018-01-14 DIAGNOSIS — E119 Type 2 diabetes mellitus without complications: Secondary | ICD-10-CM

## 2018-06-02 ENCOUNTER — Encounter: Payer: Self-pay | Admitting: Gastroenterology

## 2018-08-25 IMAGING — CT CT ABD-PELV W/ CM
2 of 5 series · 15 of 46 positions shown, 17 images · IV contrast (ISOVUE)
Comparison: None.

CLINICAL DATA: Fatigue for 1 week and epigastric pain with weight
loss

EXAM:
CT ABDOMEN AND PELVIS WITH CONTRAST
TECHNIQUE: Multidetector CT imaging of the abdomen and pelvis was performed
using the standard protocol following bolus administration of
intravenous contrast.
CONTRAST:  100mL [id number] IOPAMIDOL ([id number]) INJECTION 61%

[Series 2: abd/pel with · axial · 0.86mm/px · z∈[+1040,+1430]mm · 12 of 92 slices shown, 14 images]
[im 7/92  soft-tissue]
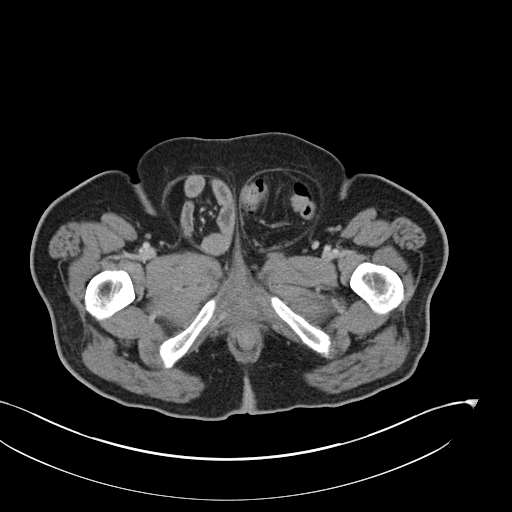
[im 7/92  bone]
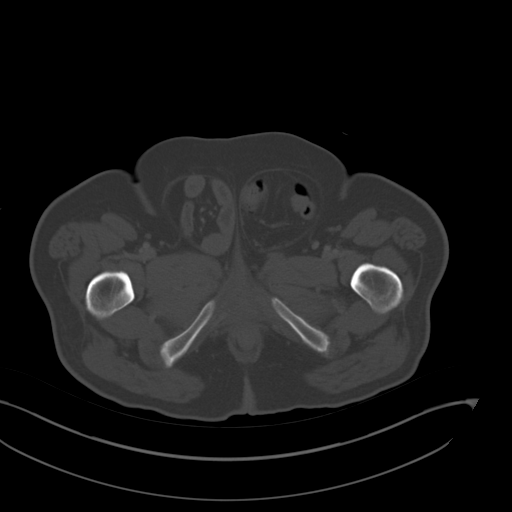
[im 13/92  soft-tissue]
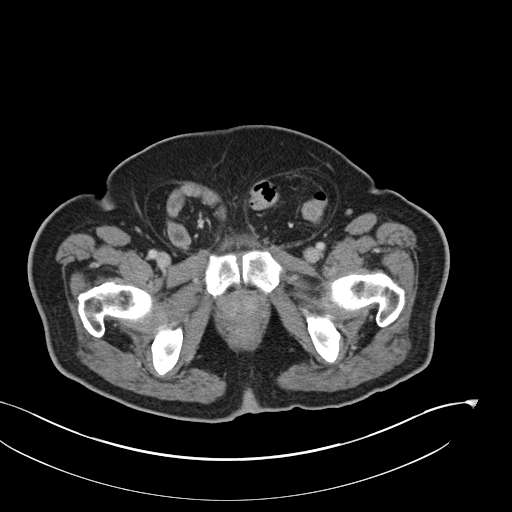
[im 19/92  soft-tissue]
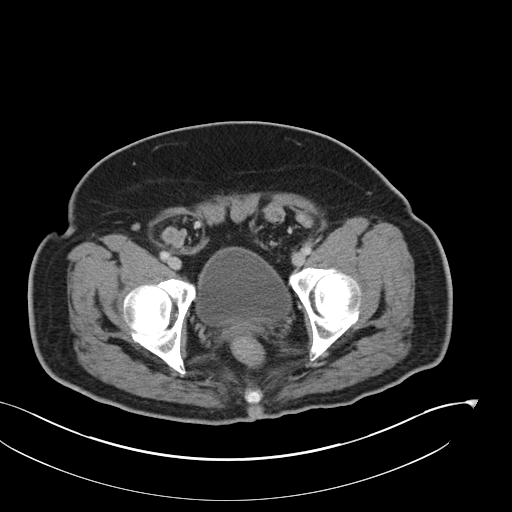
[im 31/92  soft-tissue]
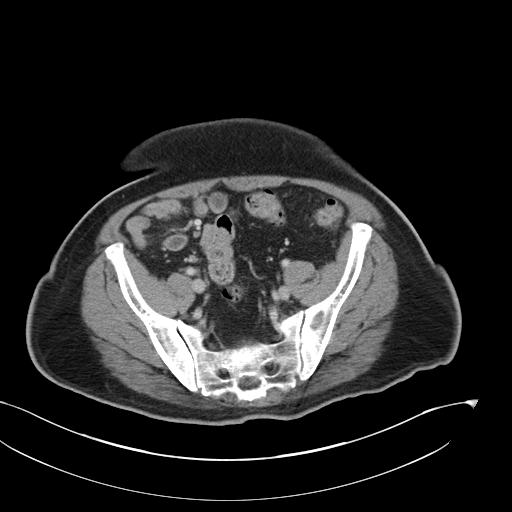
[im 37/92  soft-tissue]
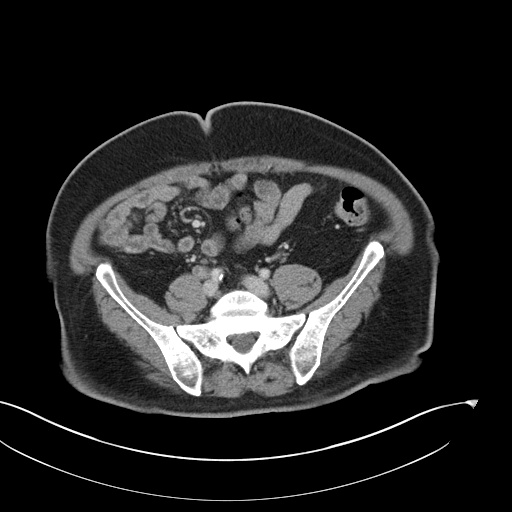
[im 43/92  soft-tissue]
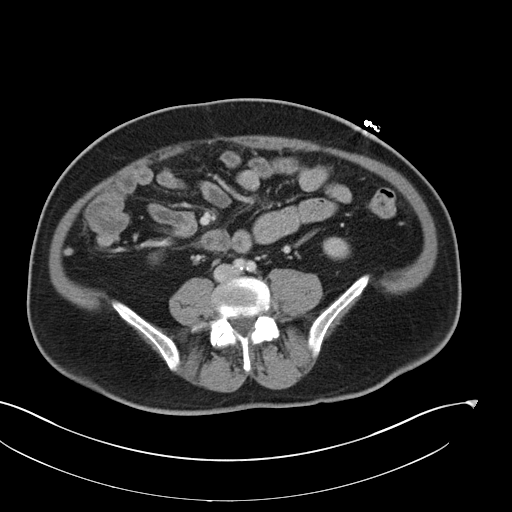
[im 49/92  soft-tissue]
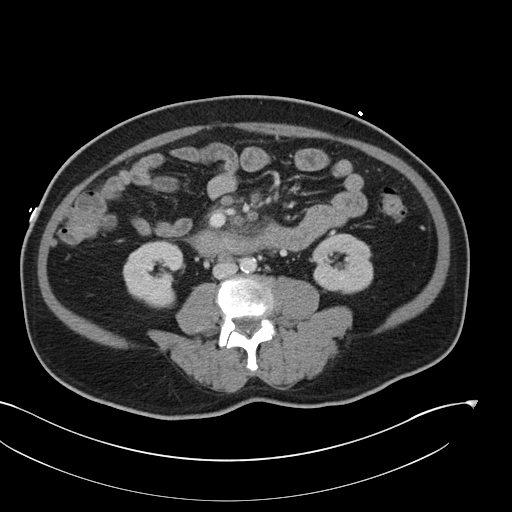
[im 55/92  soft-tissue]
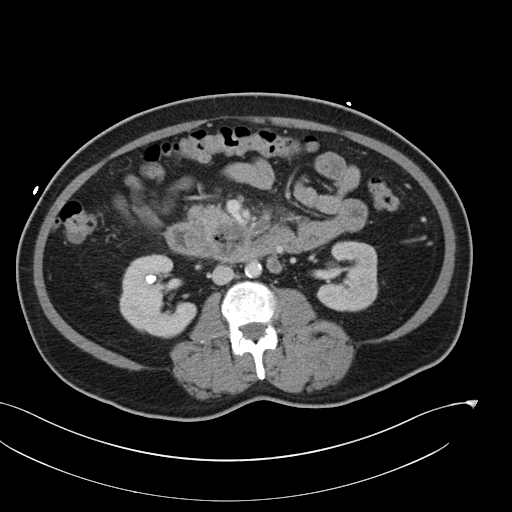
[im 61/92  soft-tissue]
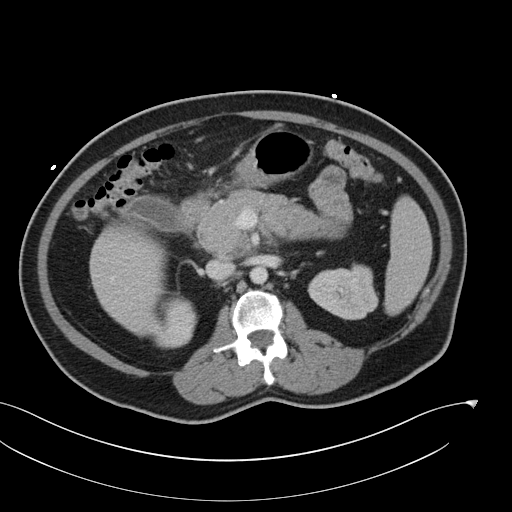
[im 61/92  bone]
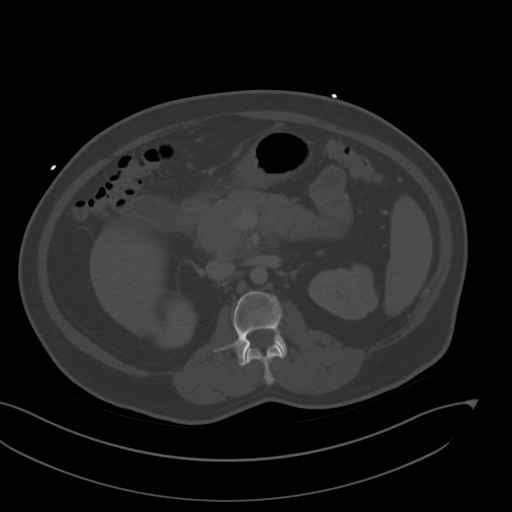
[im 73/92  soft-tissue]
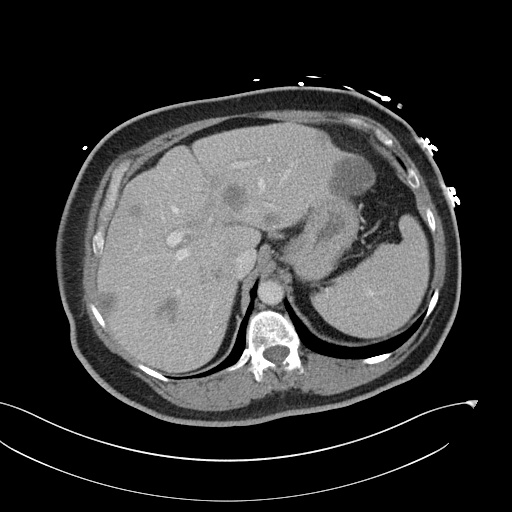
[im 79/92  soft-tissue]
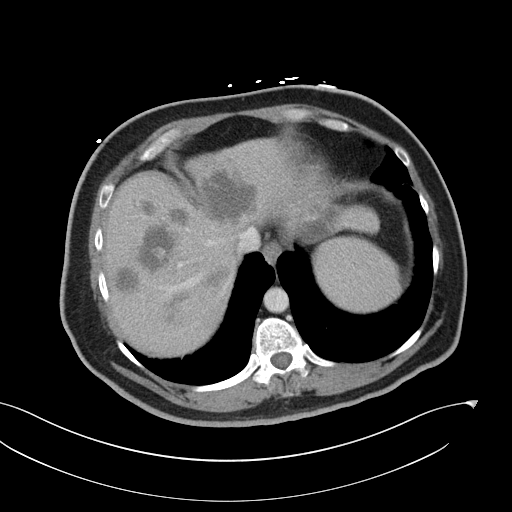
[im 85/92  soft-tissue]
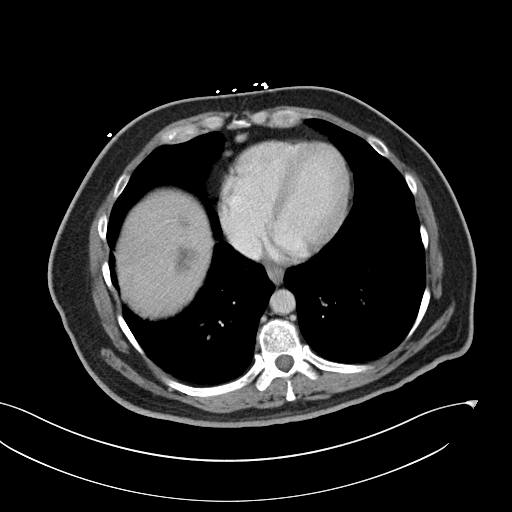

[Series 4: coronal a/|p · coronal · 0.83mm/px · 3 of 152 slices shown]
[im 51/152  soft-tissue]
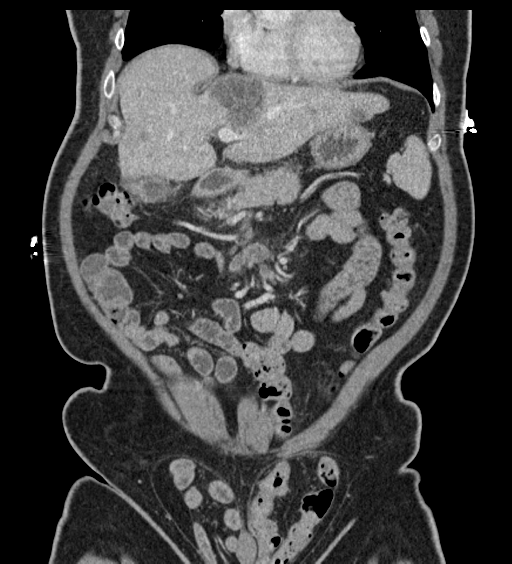
[im 68/152  soft-tissue]
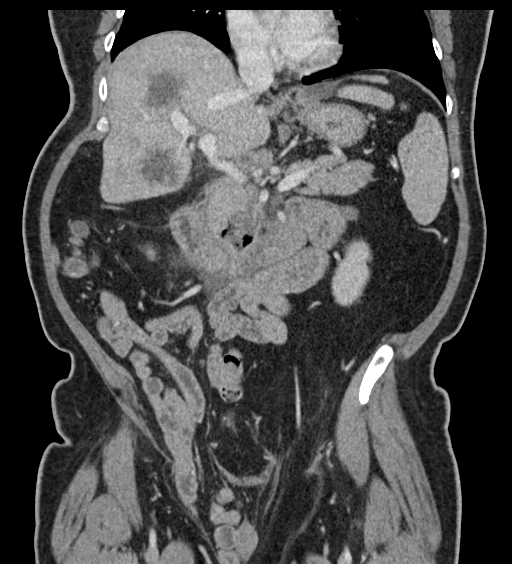
[im 84/152  soft-tissue]
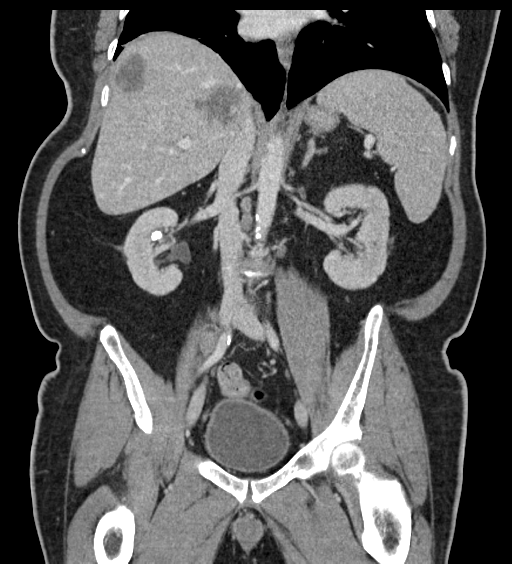

[15 of 46 positions shown; findings below may reference images not displayed]

FINDINGS: Lower chest: Lung bases are free of acute infiltrate or sizable
effusion.

Hepatobiliary: The gallbladder is within normal limits. The liver
demonstrates multiple hypodense lesions with peripheral enhancement
most consistent with metastatic disease. These do not demonstrate
significant enhancement on delayed images. The largest of these in
the left lobe of the liver measures approximately 5.1 cm. The
largest of these in the right lobe of the liver measures
approximately 5.8 cm. No biliary ductal dilatation is seen.

Pancreas: The body and tail of the pancreas are within normal
limits. In the region of the pancreatic head however in the head as
prominent with suggestion of underlying mass lesion. This would
correspond with the changes seen in the liver. There is suggestion
of mass lesion measures approximately 5 cm in greatest dimension.
Some associated adjacent necrotic appearing lymph nodes are seen in
the region of the celiac axis (15 mm) as well as in the periaortic
region and mesentery (12 mm). Peripancreatic (2.1 cm) and
gastrohepatic (11 mm) adenopathy is noted as well. An adjacent
duodenal diverticulum is seen with both air and fluid within.

Spleen: Normal in size without focal abnormality.

Adrenals/Urinary Tract: The adrenal glands are within normal limits.
The kidneys demonstrates no obstructive change. Large nonobstructing
right renal stone is seen measuring approximately 10 mm. The bladder
is partially distended.

Stomach/Bowel: Bilateral inguinal hernias are identified with loops
of small bowel within the right inguinal hernia and loops of sigmoid
colon in the left inguinal hernia. No obstructive changes are seen.
The appendix is within normal limits. Scattered diverticular change
is noted in the colon without diverticulitis. Large duodenal
diverticulum is noted adjacent to the head of the pancreas.

Vascular/Lymphatic: Atherosclerotic calcifications are identified.
Additionally there are changes of lymphadenopathy along the right
common iliac artery measuring approximately 12 mm in short axis.
Periaortic and intra-aortocaval lymph nodes are seen as well as the
previously described changes in the region of the gastrohepatic
ligament as well as the peripancreatic region. Some of these nodes
are intimately associated with suggested pancreatic head mass. The
largest of these measures approximately 2.1 cm in short axis with
central necrosis.

Reproductive: Prostate is unremarkable.

Other: No abdominal wall hernia or abnormality. No abdominopelvic
ascites.

Musculoskeletal: No acute or significant osseous findings.
IMPRESSION: Changes consistent with hepatic metastatic disease and likely
primary pancreatic mass in the region of the head of the pancreas.
Surrounding associated lymphadenopathy is noted as described. Tissue
sampling of the liver lesions is recommended. Additional workup for
distant metastatic disease is recommended as well.

Nonobstructing right renal stone

These results were called by telephone at the time of interpretation
on 10/29/2017 at [DATE] to JOSEDADOU GUEDMIE, PA , who verbally
acknowledged these results.

## 2018-08-25 IMAGING — CR DG CHEST 2V
2 series · 2 of 2 positions shown · non-contrast
Comparison: None.

CLINICAL DATA: Hypotension.

EXAM:
CHEST  2 VIEW

[w chest lat]
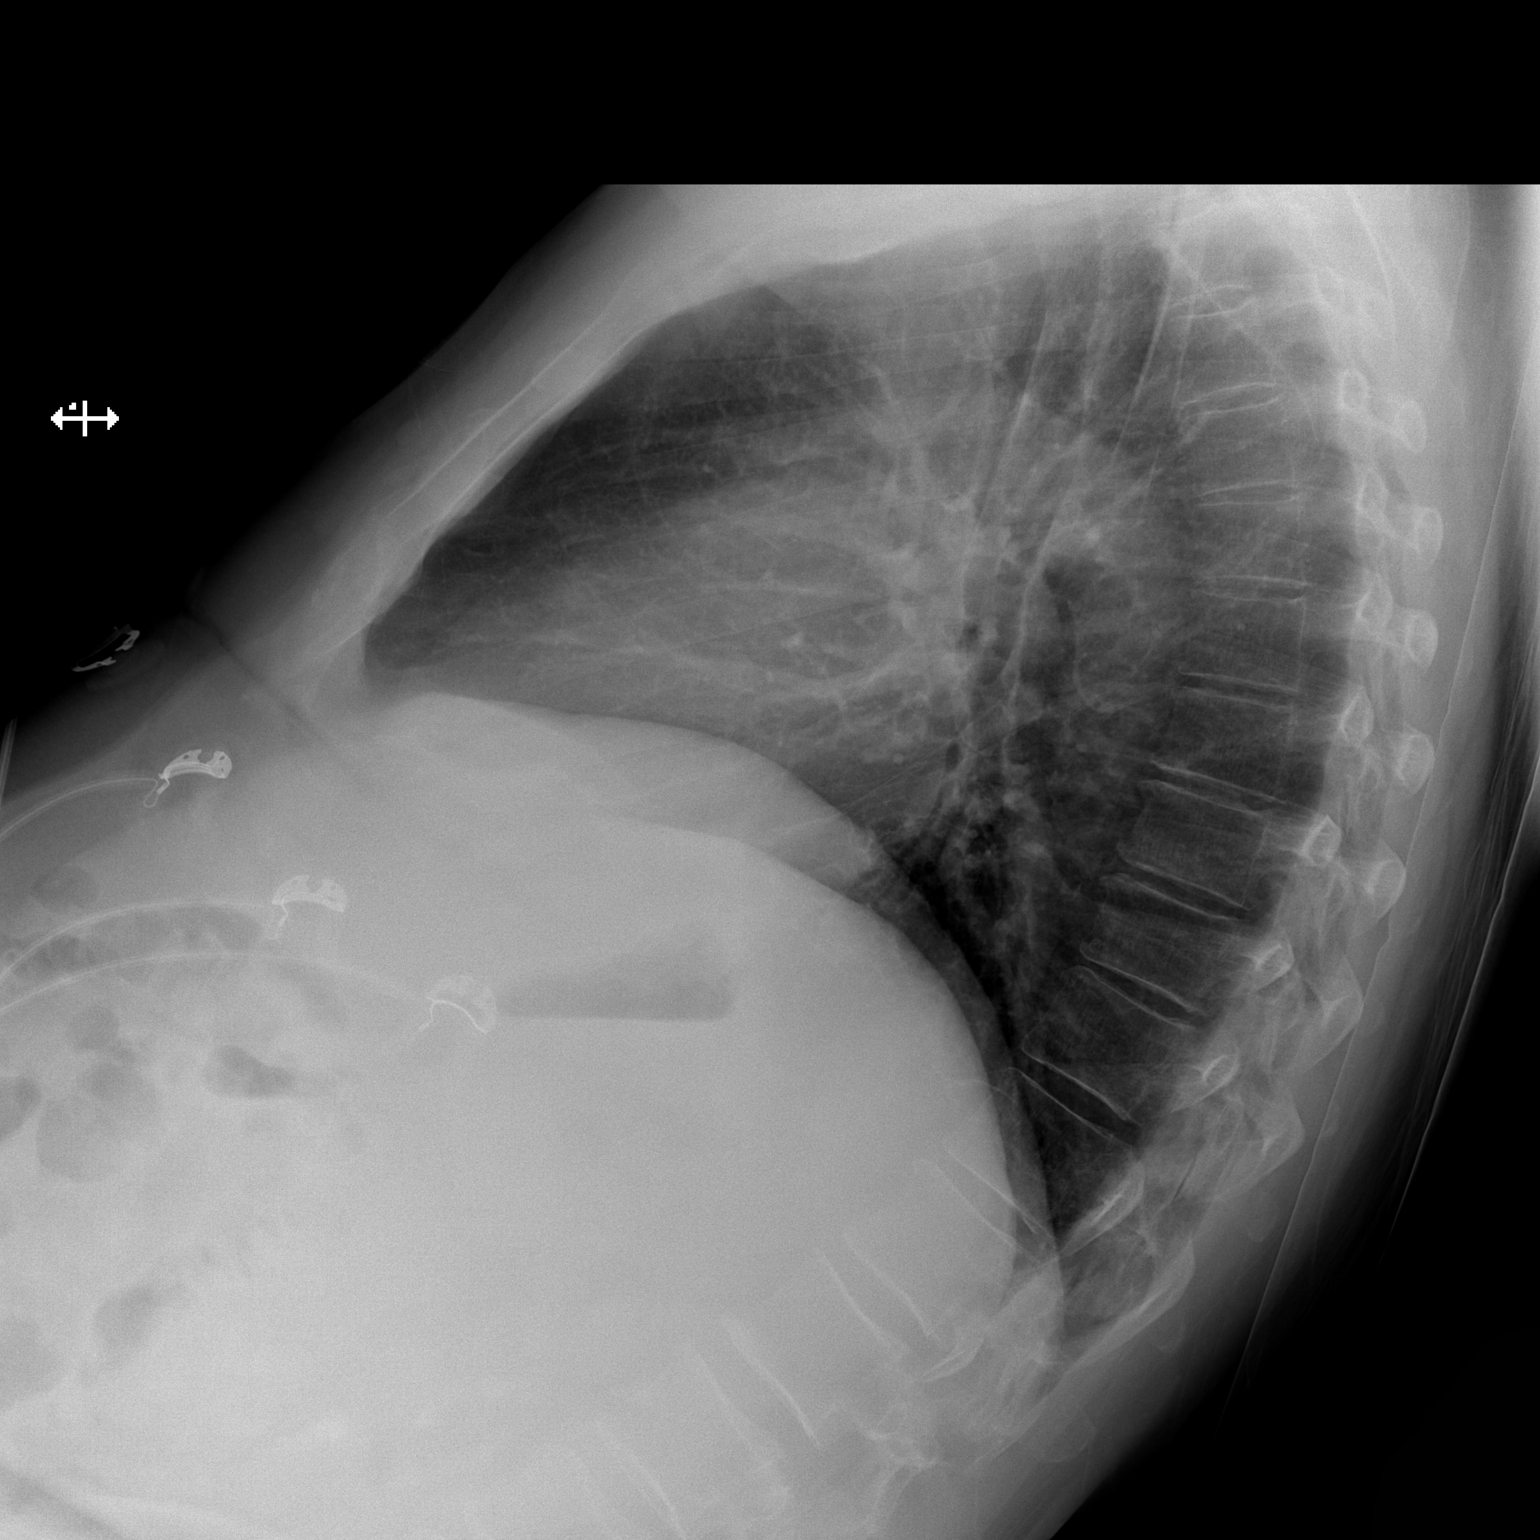

[x chest ap]
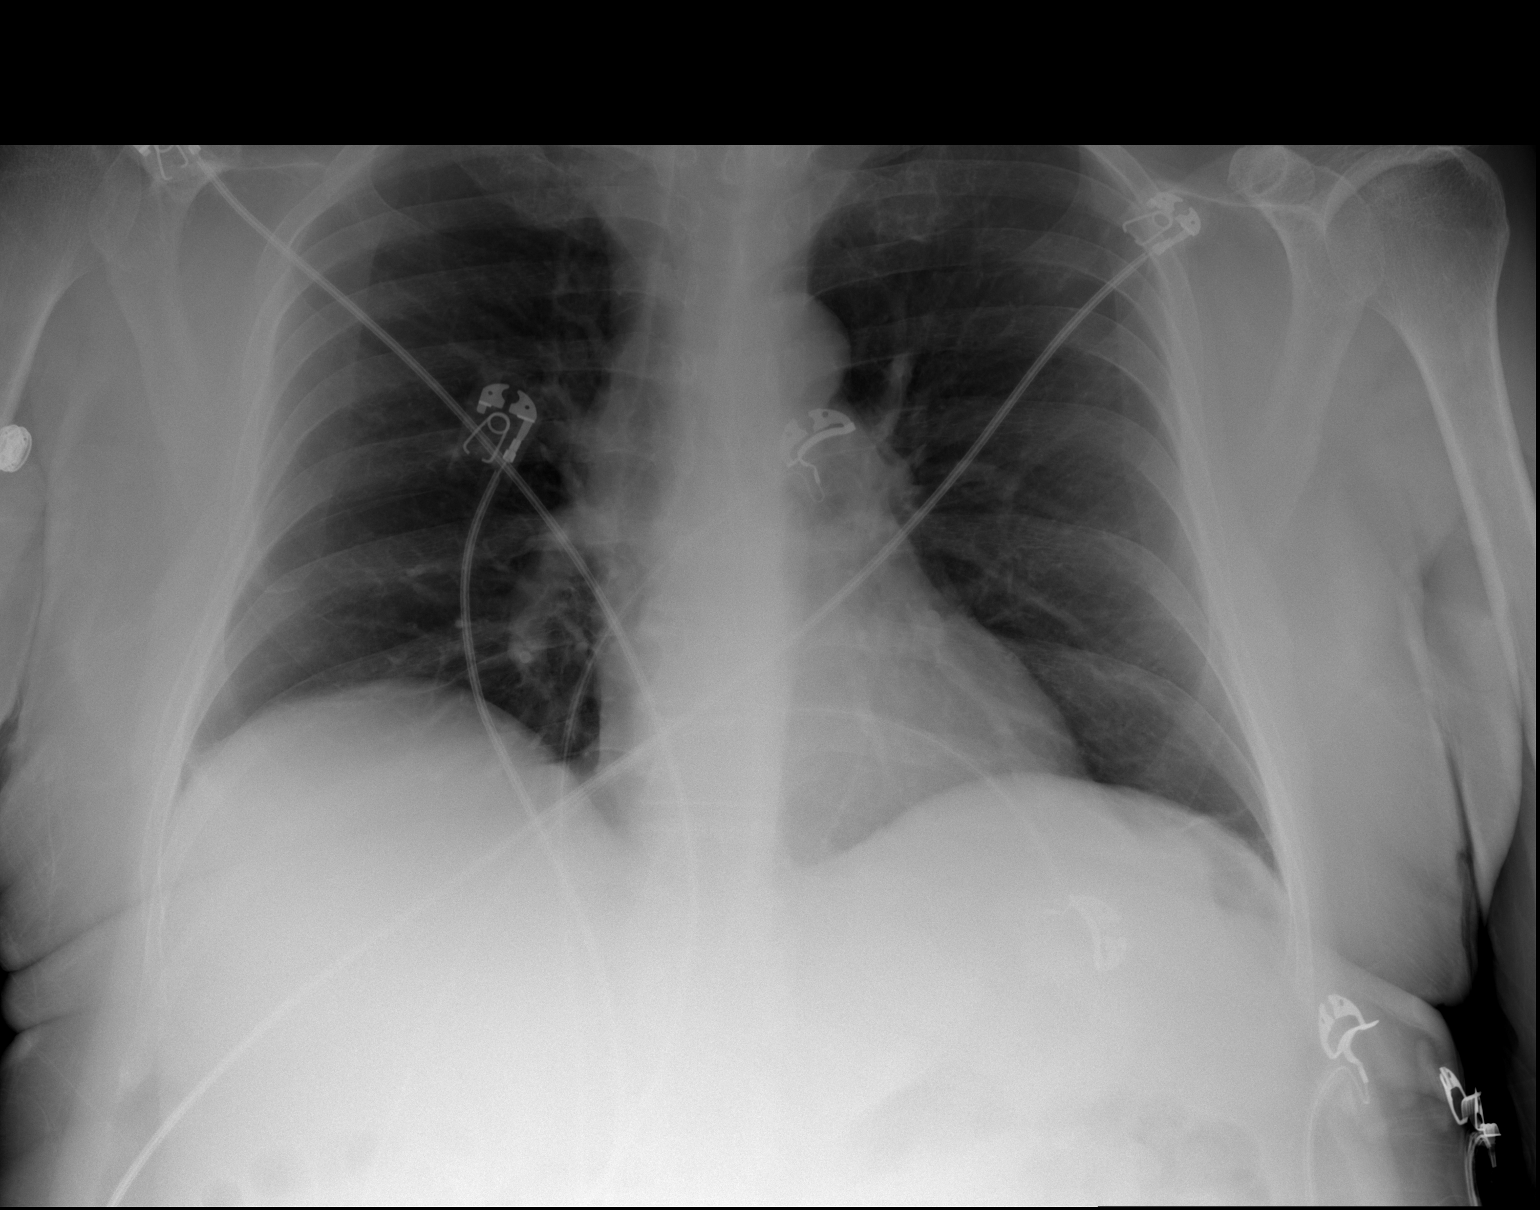

[2 of 2 positions shown; findings below may reference images not displayed]

FINDINGS: The heart size and mediastinal contours are within normal limits.
Both lungs are clear. The visualized skeletal structures are
unremarkable.
IMPRESSION: No active cardiopulmonary disease.

## 2018-08-26 IMAGING — US IR US GUIDANCE
1 series · 10 of 10 positions shown · non-contrast
Comparison: CT abdomen and pelvis - [REDACTED]

INDICATION: Concern for metastatic pancreatic cancer. Please perform liver
lesion biopsy for tissue diagnostic purposes.

EXAM:
ULTRASOUND GUIDED LIVER LESION BIOPSY

[Series 1: ir us guidance · 0.22mm/px · 10 of 10 slices shown]
[im 1/10]
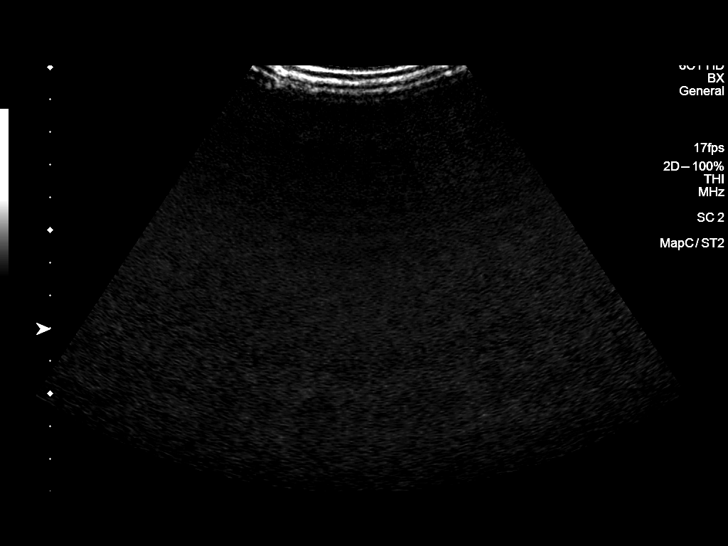
[im 2/10]
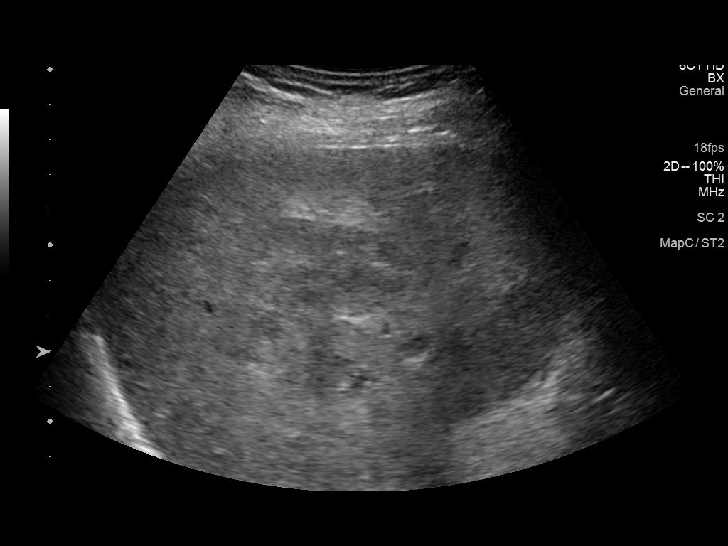
[im 3/10]
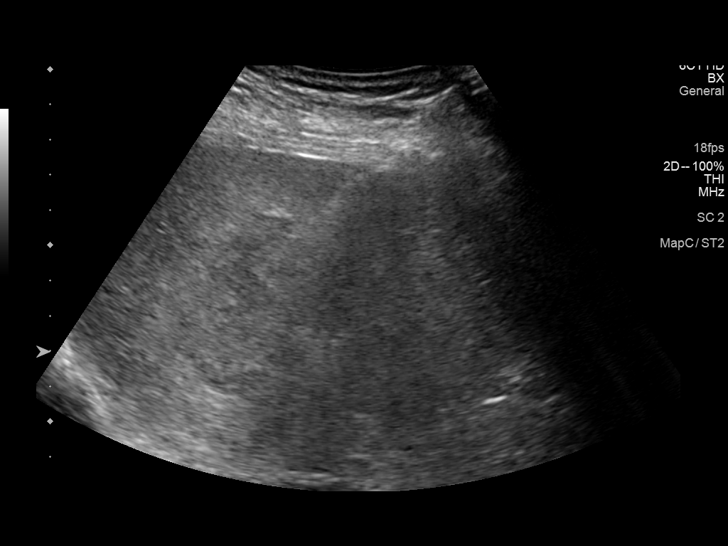
[im 4/10]
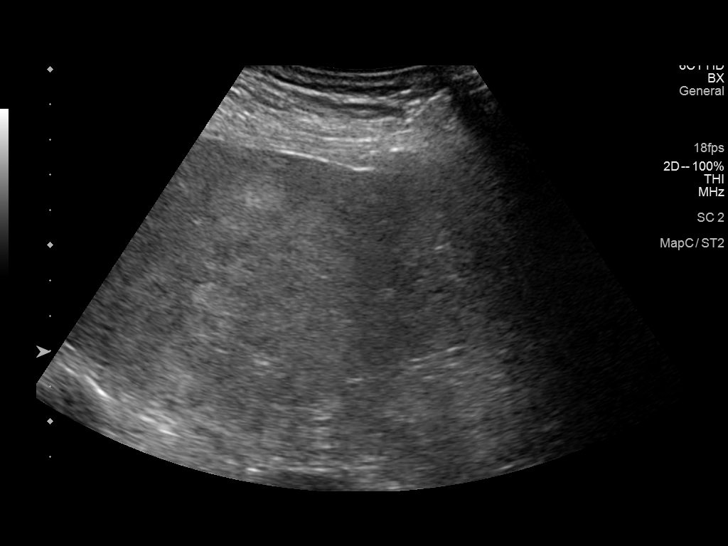
[im 5/10]
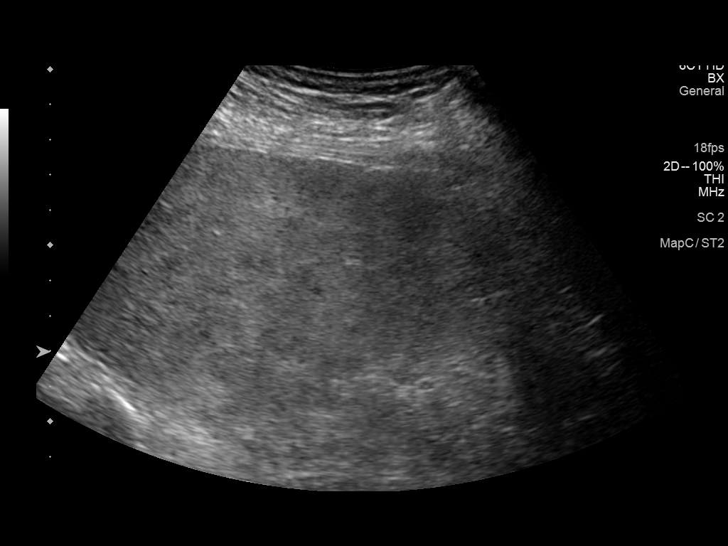
[im 6/10]
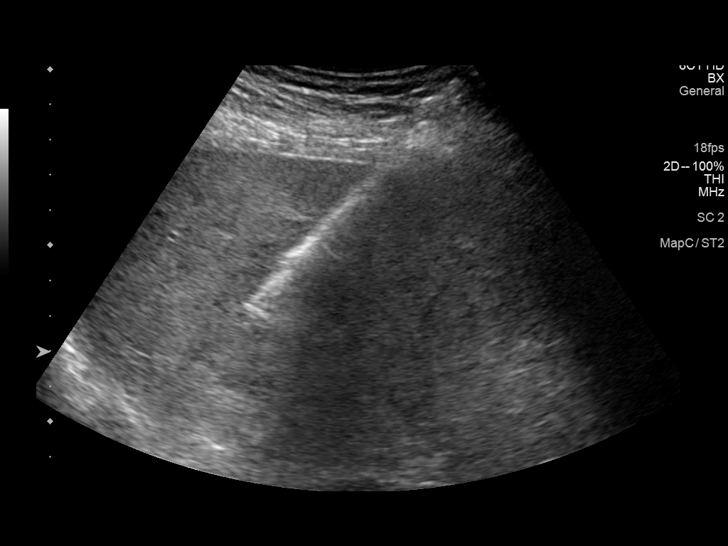
[im 7/10]
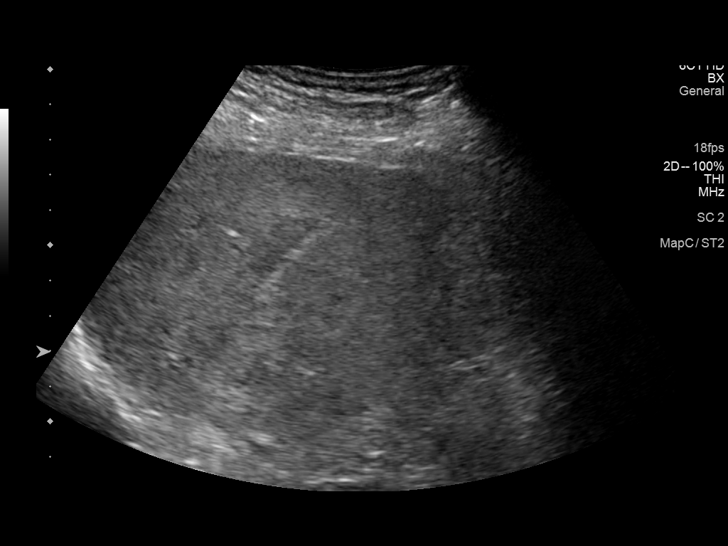
[im 8/10]
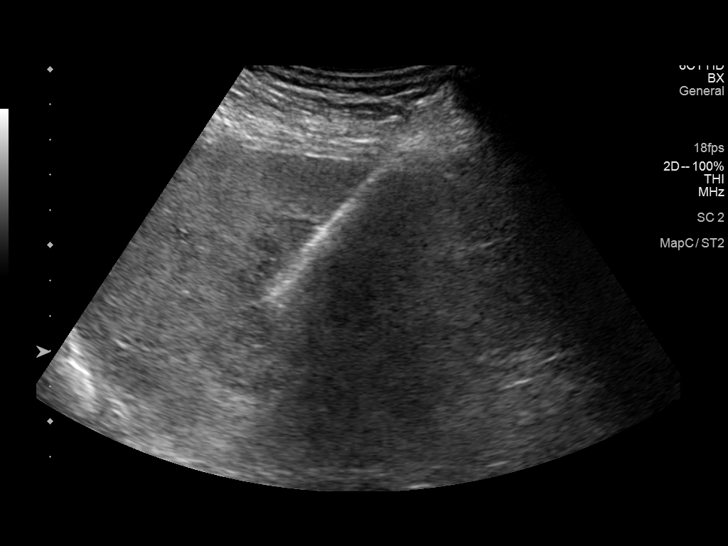
[im 9/10]
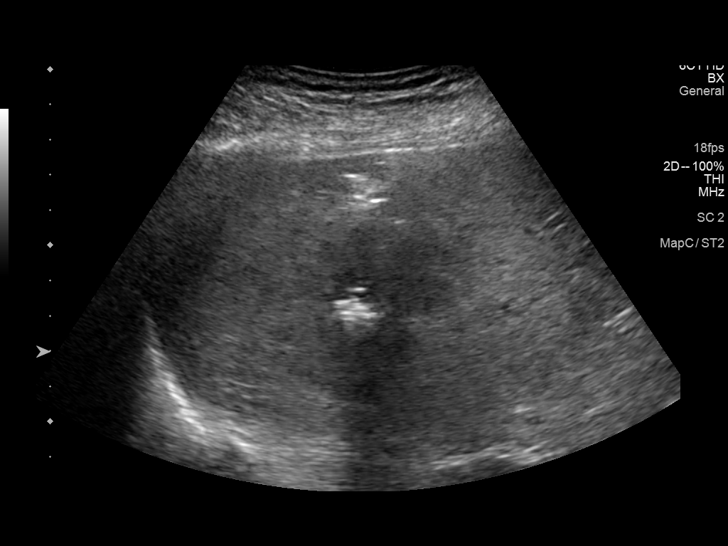
[im 10/10]
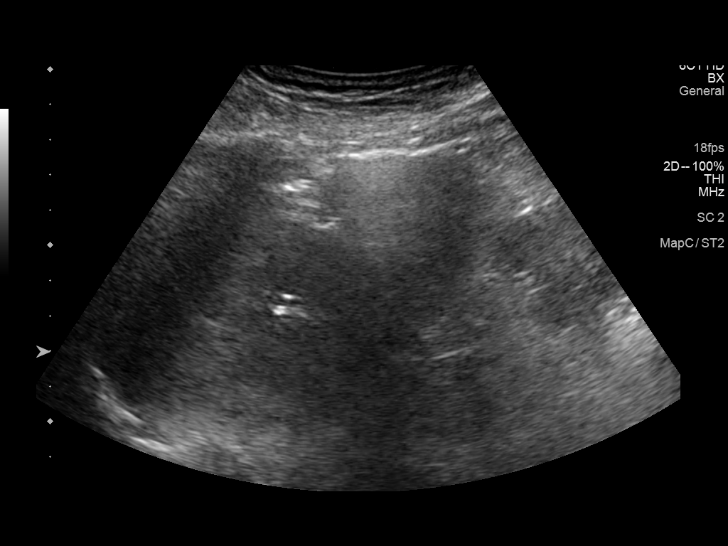

[10 of 10 positions shown; findings below may reference images not displayed]

MEDICATIONS:
None

ANESTHESIA/SEDATION:
Fentanyl 75 mcg IV; Versed 2 mg IV

Total Moderate Sedation time: 11 minutes.

The patient's level of consciousness and vital signs were monitored
continuously by radiology nursing throughout the procedure under my
direct supervision.

COMPLICATIONS:
None immediate.

PROCEDURE:
Informed written consent was obtained from the patient after a
discussion of the risks, benefits and alternatives to treatment. The
patient understands and consents the procedure. A timeout was
performed prior to the initiation of the procedure.

Ultrasound scanning was performed of the right upper abdominal
quadrant demonstrates a large, at least 6.4 x 5.1 cm, lesion/mass
within the right lobe liver correlating with the lesion seen on
image 25, series 2 of preceding abdominal CT. The procedure was
planned.

The right upper abdominal quadrant was prepped and draped in the
usual sterile fashion. The overlying soft tissues were anesthetized
with 1% lidocaine with epinephrine. A 17 gauge, 6.8 cm co-axial
needle was advanced into a peripheral aspect of the lesion. This was
followed by 4 core biopsies with an 18 gauge core device under
direct ultrasound guidance.

The coaxial needle tract was embolized with a small amount of
Gel-Foam slurry and superficial hemostasis was obtained with manual
compression. Post procedural scanning was negative for definitive
area of hemorrhage or additional complication. A dressing was
placed. The patient tolerated the procedure well without immediate
post procedural complication.
IMPRESSION: Technically successful ultrasound guided core needle biopsy of
indeterminate mass within the caudal aspect of the right lobe of the
liver.

## 2018-11-02 IMAGING — US US ABDOMEN LIMITED
1 series · 14 of 25 positions shown · non-contrast
Comparison: CT of the abdomen and pelvis 10/29/2017

CLINICAL DATA: Hepatic mass.  Pancreatic cancer.

EXAM:
ULTRASOUND ABDOMEN LIMITED RIGHT UPPER QUADRANT

[Series 1: us abdomen limited · 0.23mm/px · 14 of 71 slices shown]
[im 1/71]
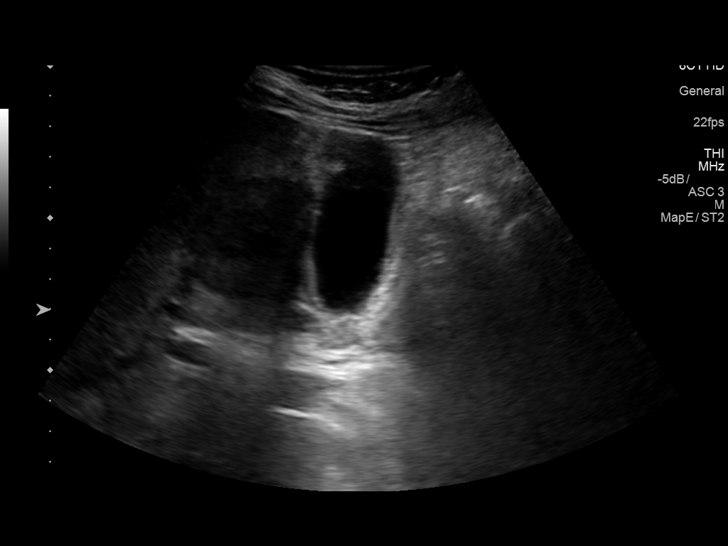
[im 6/71]
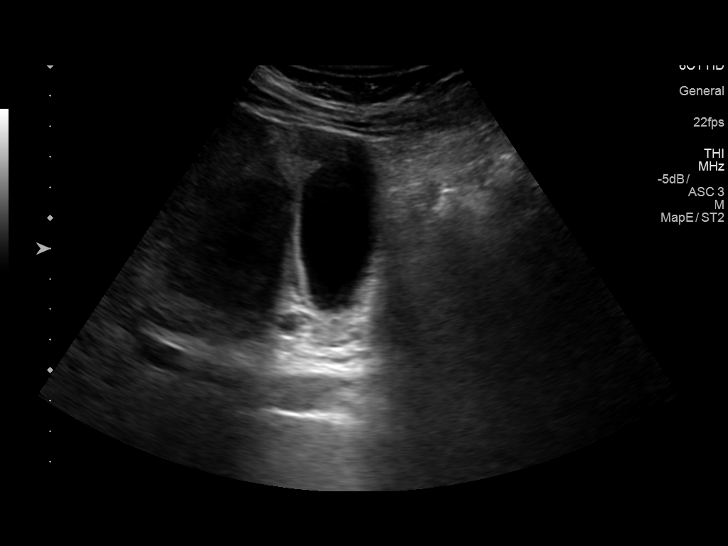
[im 12/71]
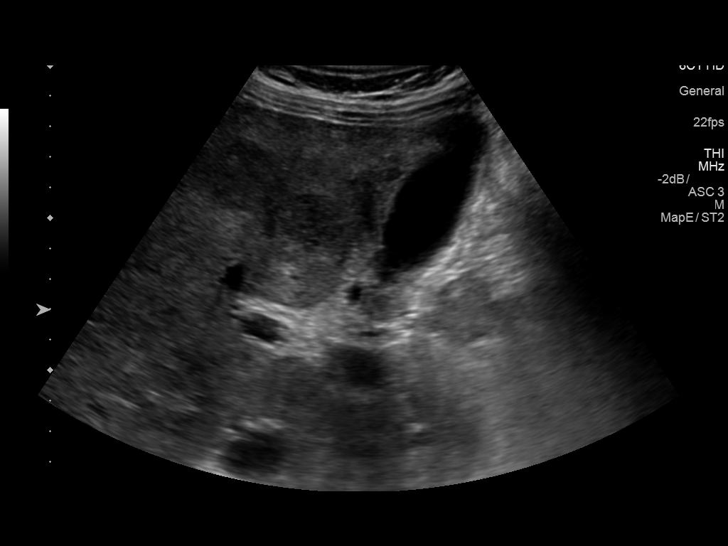
[im 18/71]
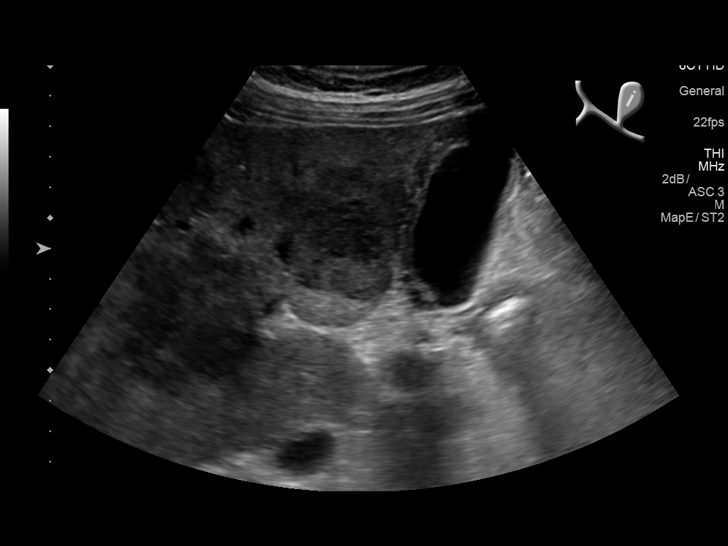
[im 24/71]
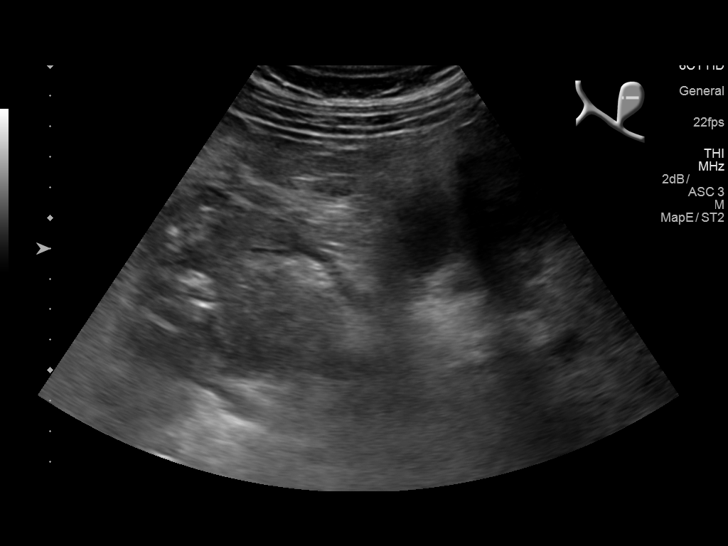
[im 27/71]
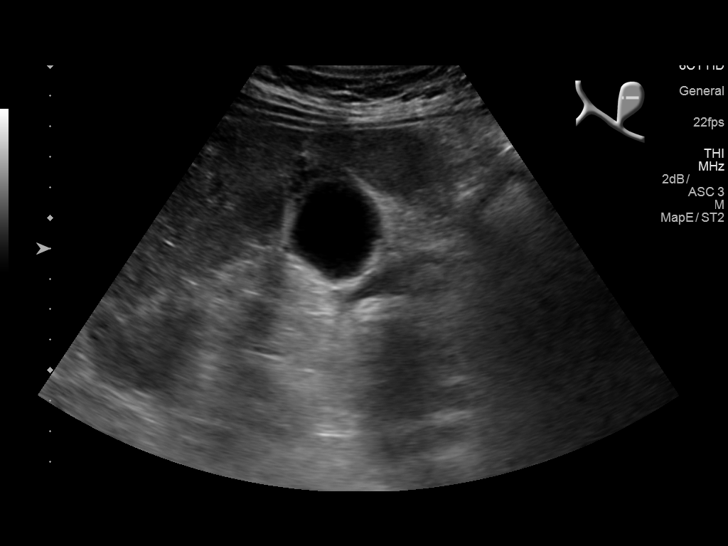
[im 33/71]
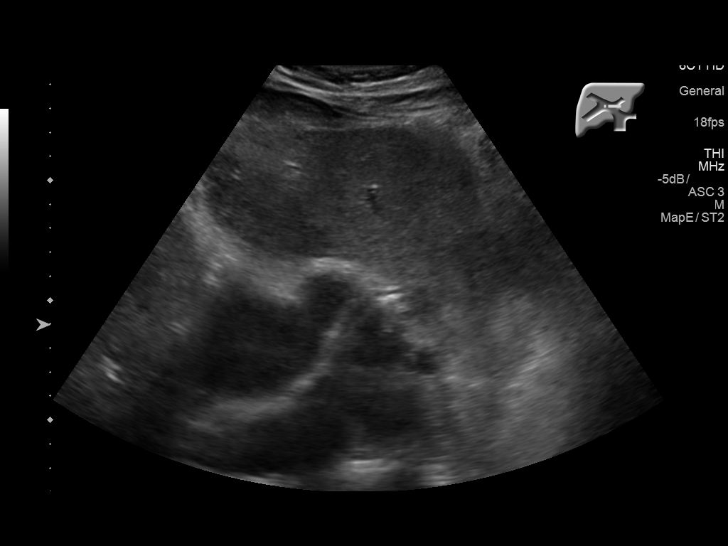
[im 38/71]
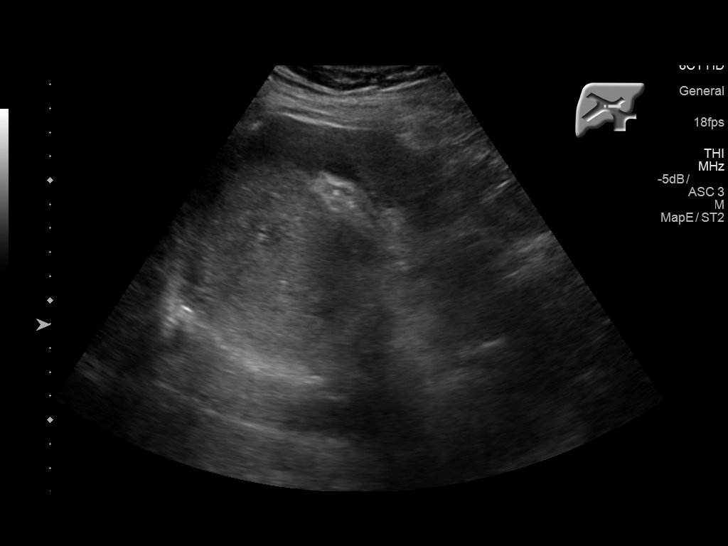
[im 44/71]
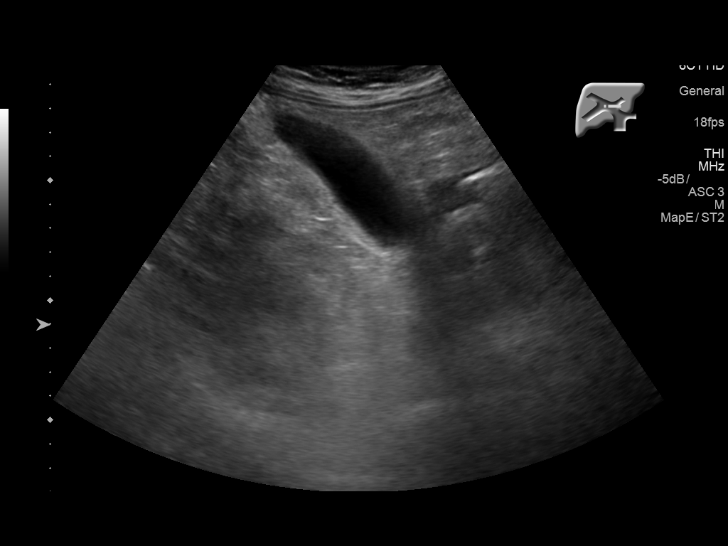
[im 47/71]
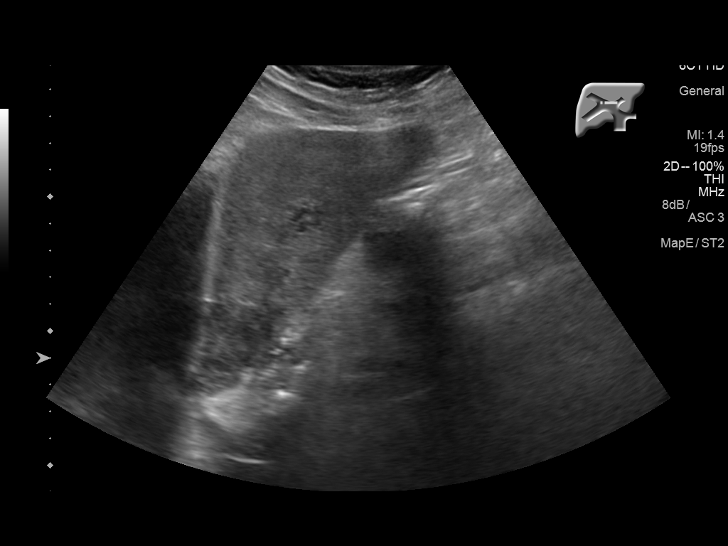
[im 53/71]
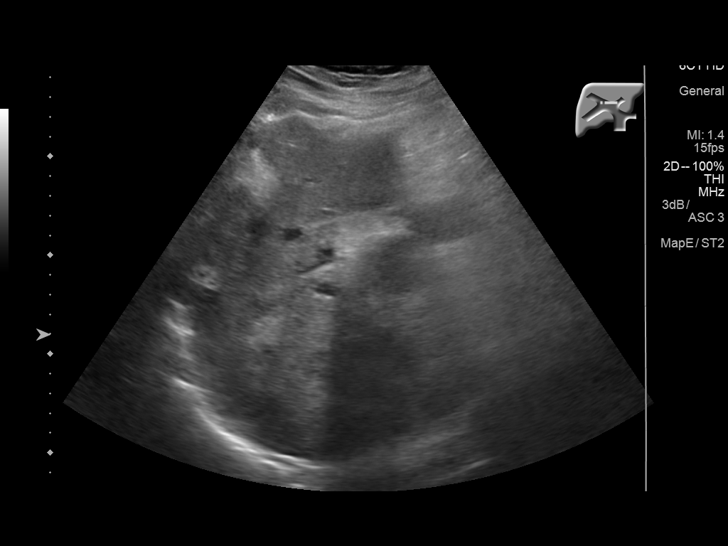
[im 59/71]
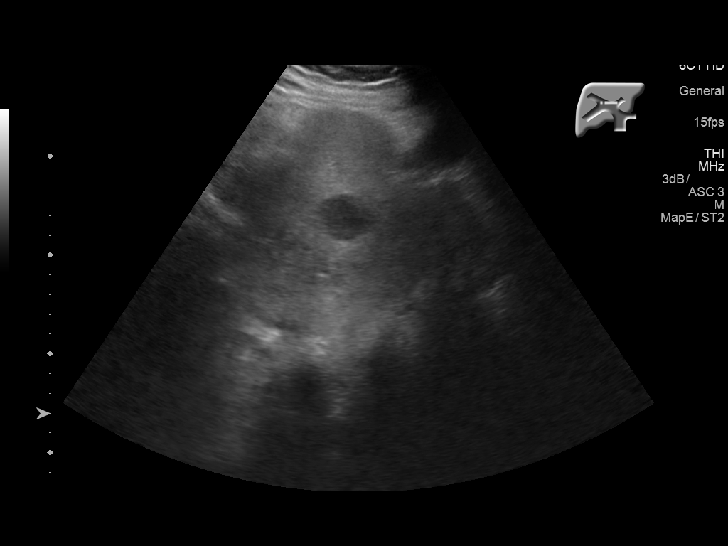
[im 65/71]
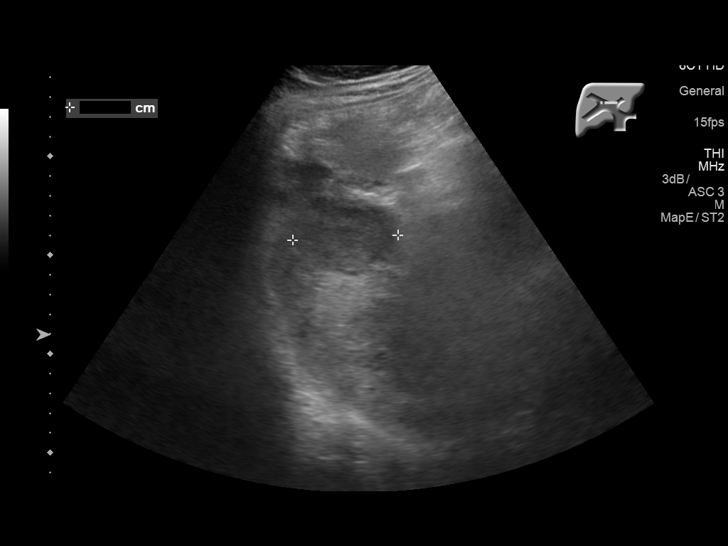
[im 71/71]
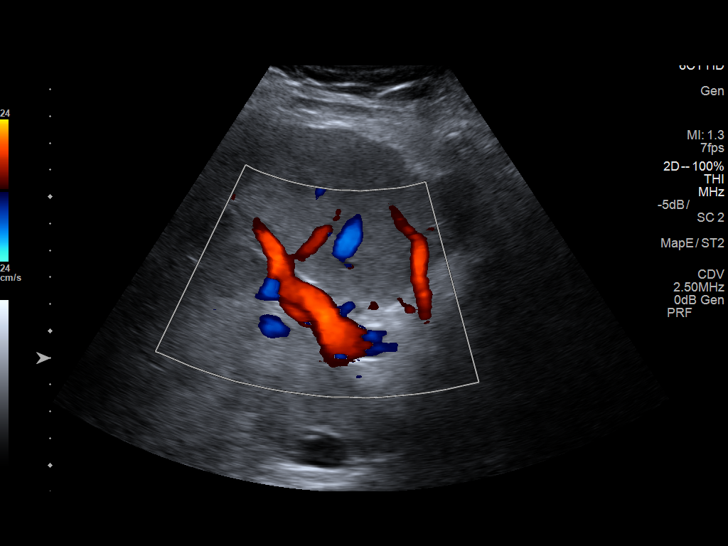

[14 of 25 positions shown; findings below may reference images not displayed]

FINDINGS: Gallbladder:

Gallbladder wall is focally thickened, up to 5.8 mm. No sonographic
Murphy's sign or stones.

Common bile duct:

Diameter: 4.4 mm

Liver:

The liver is diffusely heterogeneous. Numerous discrete lesions are
identified. In the left hepatic lobe, a mass is 5.2 x 4.1 x 5.3 cm.
In the right hepatic lobe, a mass is 2.6 x 2.6 x 2.3 cm. A third
measured lesion is 5.0 x 4.2 x 5.3 cm. Portal vein is patent on
color Doppler imaging with normal direction of blood flow towards
the liver.

Additional:

Note is made of a small amount of right perinephric fluid. An
intrarenal calculus on the right measures approximately 7 mm.
IMPRESSION: 1. Irregularly thickened gallbladder wall.
2. Numerous liver metastases, measuring up to 5.3 cm.
3. Nephrolithiasis.
4. Right perinephric fluid.
